# Patient Record
Sex: Female | Born: 1980 | Race: White | Hispanic: No | Smoking: Never smoker
Health system: Southern US, Community
[De-identification: ages and names within clinical notes are randomized; demographics above are authoritative.]

---

## 2002-02-23 ENCOUNTER — Other Ambulatory Visit: Admission: RE | Admit: 2002-02-23 | Discharge: 2002-02-23 | Payer: Self-pay | Admitting: Obstetrics and Gynecology

## 2003-03-07 ENCOUNTER — Other Ambulatory Visit: Admission: RE | Admit: 2003-03-07 | Discharge: 2003-03-07 | Payer: Self-pay

## 2007-03-09 ENCOUNTER — Ambulatory Visit: Payer: Self-pay | Admitting: Family Medicine

## 2007-04-06 ENCOUNTER — Ambulatory Visit: Payer: Self-pay | Admitting: Family Medicine

## 2007-05-18 ENCOUNTER — Ambulatory Visit: Payer: Self-pay | Admitting: Family Medicine

## 2007-07-04 ENCOUNTER — Ambulatory Visit: Payer: Self-pay | Admitting: Family Medicine

## 2007-07-18 ENCOUNTER — Ambulatory Visit: Payer: Self-pay | Admitting: Family Medicine

## 2007-10-24 ENCOUNTER — Ambulatory Visit: Payer: Self-pay | Admitting: Family Medicine

## 2007-11-28 ENCOUNTER — Other Ambulatory Visit: Admission: RE | Admit: 2007-11-28 | Discharge: 2007-11-28 | Payer: Self-pay | Admitting: Family Medicine

## 2007-11-28 ENCOUNTER — Ambulatory Visit: Payer: Self-pay | Admitting: Family Medicine

## 2007-12-22 ENCOUNTER — Encounter: Admission: RE | Admit: 2007-12-22 | Discharge: 2007-12-22 | Payer: Self-pay | Admitting: Cardiovascular Disease

## 2007-12-23 ENCOUNTER — Ambulatory Visit (HOSPITAL_COMMUNITY): Admission: RE | Admit: 2007-12-23 | Discharge: 2007-12-23 | Payer: Self-pay | Admitting: Cardiology

## 2008-02-22 ENCOUNTER — Ambulatory Visit: Payer: Self-pay | Admitting: Family Medicine

## 2008-07-24 ENCOUNTER — Ambulatory Visit (HOSPITAL_COMMUNITY): Admission: RE | Admit: 2008-07-24 | Discharge: 2008-07-24 | Payer: Self-pay | Admitting: Oral Surgery

## 2008-11-27 ENCOUNTER — Other Ambulatory Visit: Admission: RE | Admit: 2008-11-27 | Discharge: 2008-11-27 | Payer: Self-pay | Admitting: Family Medicine

## 2008-11-27 ENCOUNTER — Ambulatory Visit: Payer: Self-pay | Admitting: Family Medicine

## 2009-01-16 ENCOUNTER — Ambulatory Visit: Payer: Self-pay | Admitting: Family Medicine

## 2009-01-25 ENCOUNTER — Ambulatory Visit: Payer: Self-pay | Admitting: Family Medicine

## 2009-02-07 ENCOUNTER — Encounter: Admission: RE | Admit: 2009-02-07 | Discharge: 2009-02-07 | Payer: Self-pay | Admitting: Cardiology

## 2009-04-10 ENCOUNTER — Ambulatory Visit: Payer: Self-pay | Admitting: Family Medicine

## 2009-05-02 ENCOUNTER — Ambulatory Visit: Payer: Self-pay | Admitting: Family Medicine

## 2009-05-13 ENCOUNTER — Ambulatory Visit: Payer: Self-pay | Admitting: Family Medicine

## 2009-08-22 ENCOUNTER — Ambulatory Visit: Payer: Self-pay | Admitting: Family Medicine

## 2009-09-30 ENCOUNTER — Ambulatory Visit: Payer: Self-pay | Admitting: Family Medicine

## 2009-10-17 ENCOUNTER — Ambulatory Visit: Payer: Self-pay | Admitting: Physician Assistant

## 2009-11-29 ENCOUNTER — Encounter: Admission: RE | Admit: 2009-11-29 | Discharge: 2010-01-27 | Payer: Self-pay | Admitting: Physician Assistant

## 2009-12-09 ENCOUNTER — Ambulatory Visit: Payer: Self-pay | Admitting: Physician Assistant

## 2009-12-26 ENCOUNTER — Encounter: Admission: RE | Admit: 2009-12-26 | Discharge: 2009-12-26 | Payer: Self-pay | Admitting: Family Medicine

## 2010-01-13 ENCOUNTER — Other Ambulatory Visit: Admission: RE | Admit: 2010-01-13 | Discharge: 2010-01-13 | Payer: Self-pay | Admitting: Family Medicine

## 2010-01-13 ENCOUNTER — Ambulatory Visit: Payer: Self-pay | Admitting: Family Medicine

## 2010-01-27 ENCOUNTER — Ambulatory Visit: Payer: Self-pay | Admitting: Physician Assistant

## 2010-02-18 ENCOUNTER — Ambulatory Visit: Payer: Self-pay | Admitting: Physician Assistant

## 2010-04-21 ENCOUNTER — Ambulatory Visit: Payer: Self-pay | Admitting: Family Medicine

## 2010-04-24 ENCOUNTER — Ambulatory Visit: Payer: Self-pay | Admitting: Family Medicine

## 2010-05-08 ENCOUNTER — Ambulatory Visit: Payer: Self-pay | Admitting: Family Medicine

## 2010-06-23 ENCOUNTER — Ambulatory Visit: Payer: Self-pay | Admitting: Family Medicine

## 2010-07-31 IMAGING — US US PELVIS COMPLETE
1 series · 14 of 25 positions shown · non-contrast
Comparison: None

CLINICAL DATA: Pelvic pain

TRANSABDOMINAL AND TRANSVAGINAL ULTRASOUND OF PELVIS
TECHNIQUE: Both transabdominal and transvaginal ultrasound
examinations of the pelvis were performed including evaluation of
the uterus, ovaries, adnexal regions, and pelvic cul-de-sac.

[Series 1: us pelvis complete · 0.19mm/px · 14 of 66 slices shown]
[im 1/66]
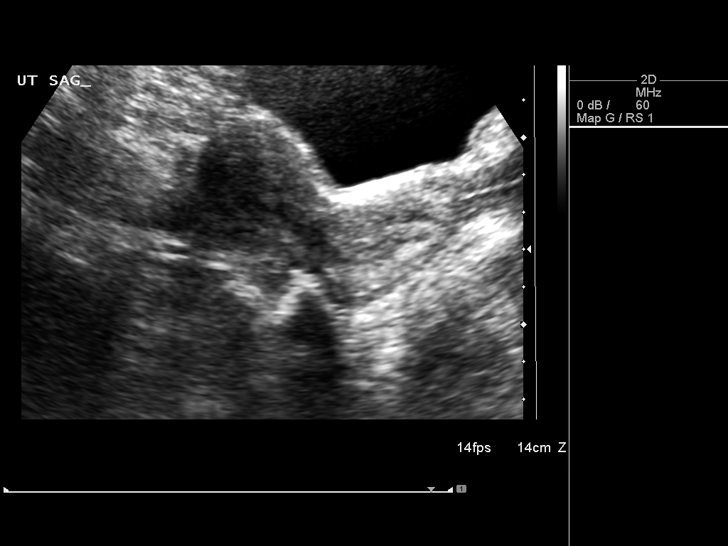
[im 6/66]
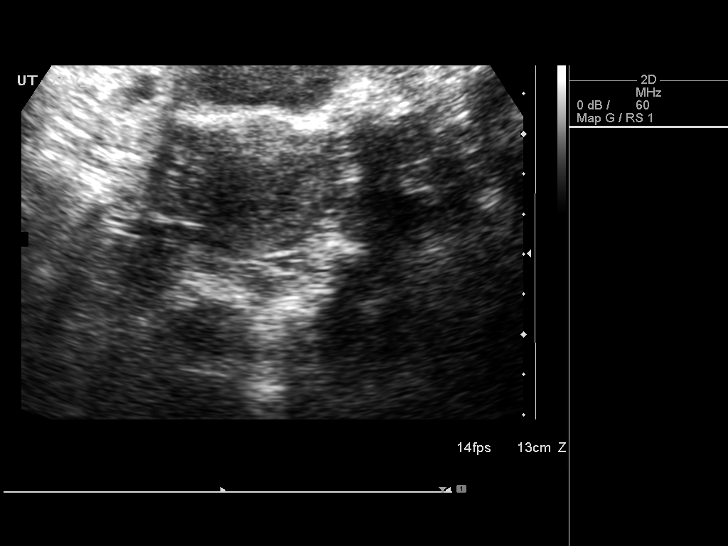
[im 11/66]
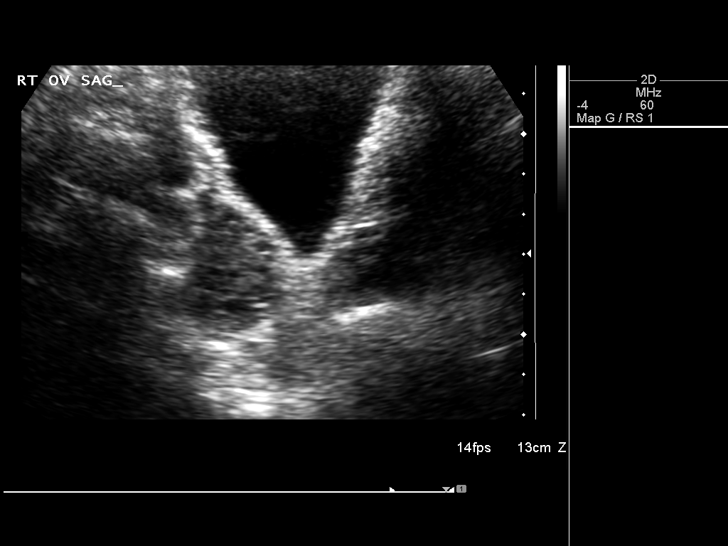
[im 17/66]
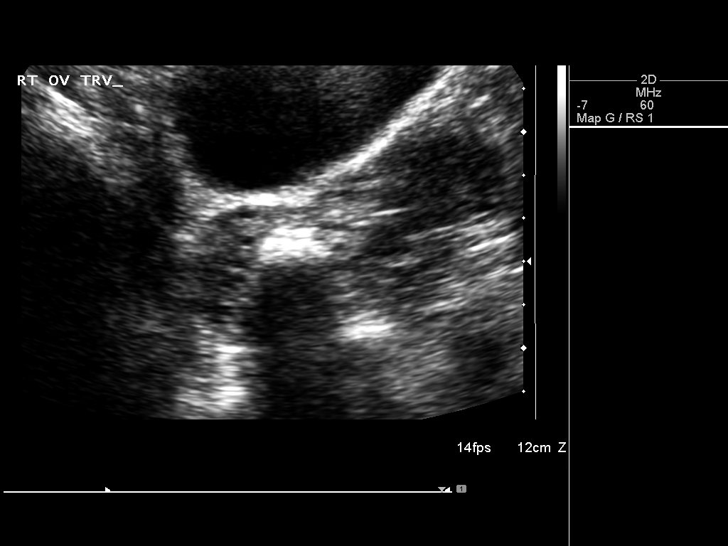
[im 22/66]
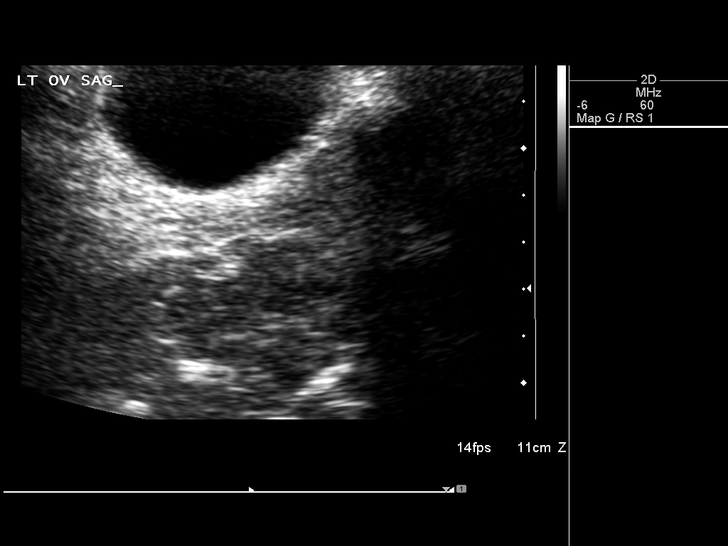
[im 25/66]
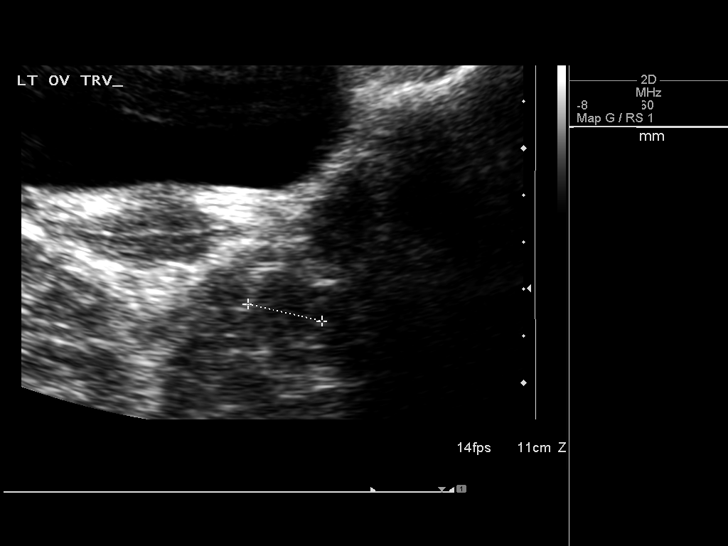
[im 30/66]
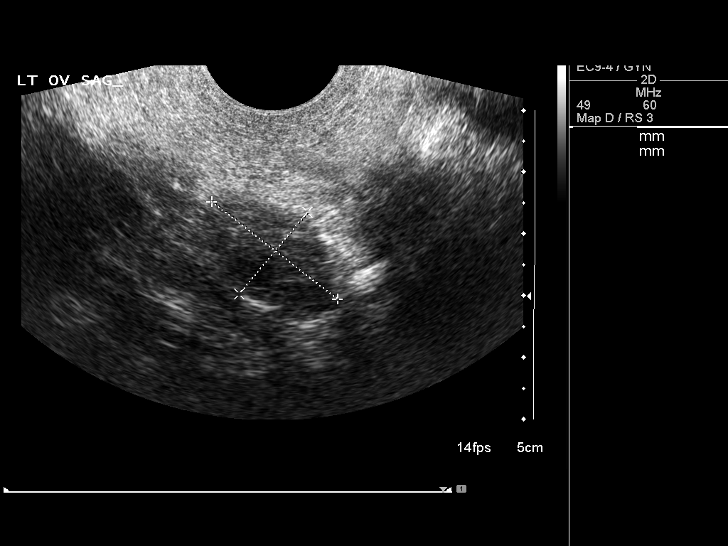
[im 36/66]
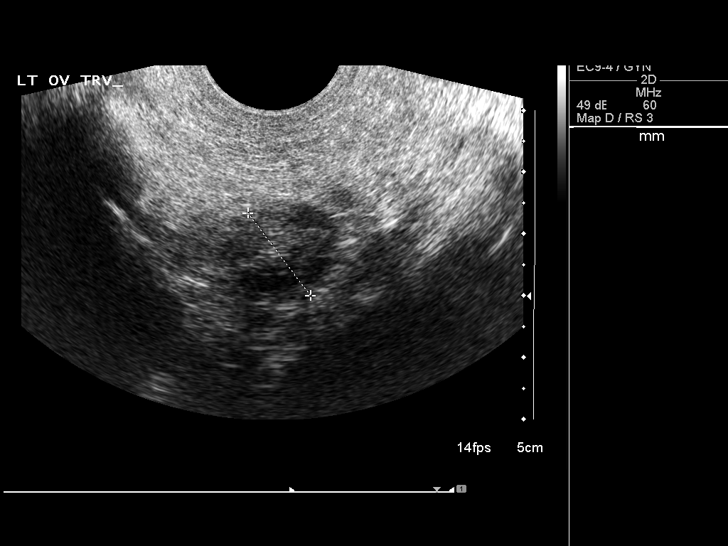
[im 41/66]
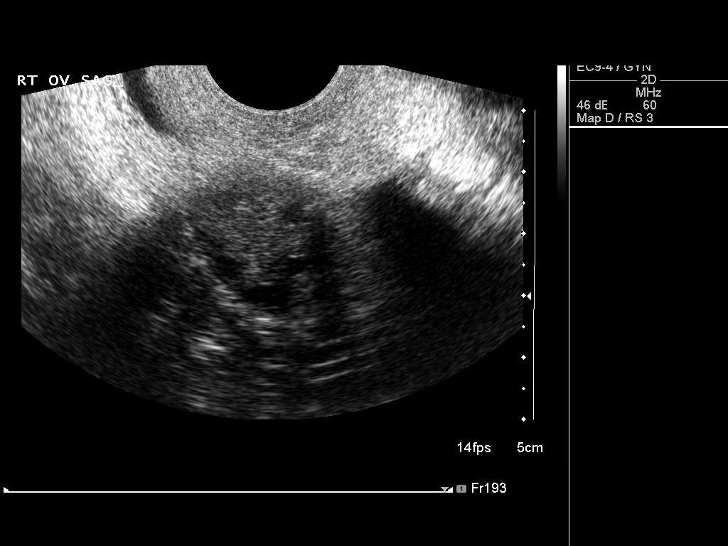
[im 44/66]
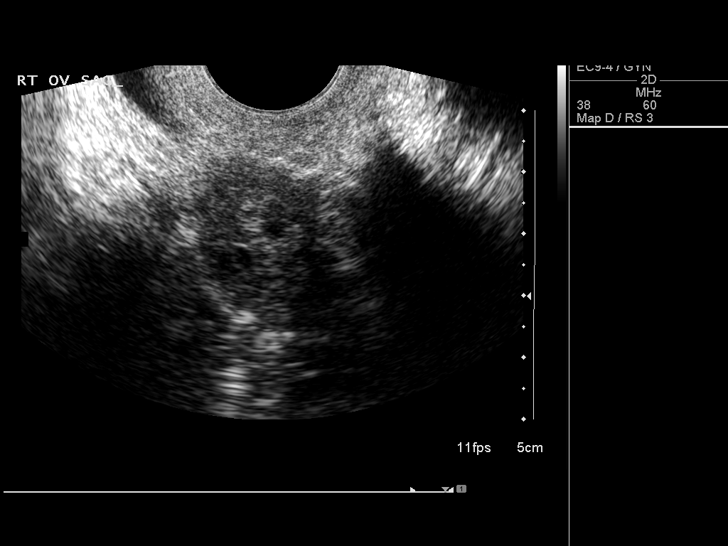
[im 49/66]
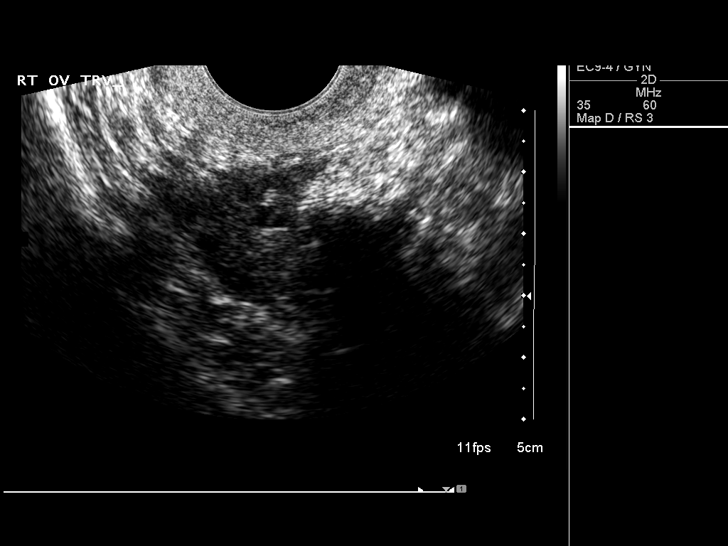
[im 55/66]
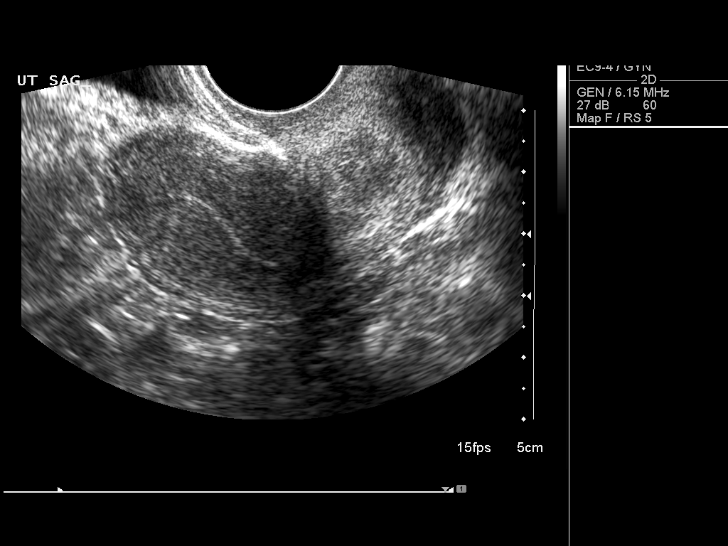
[im 60/66]
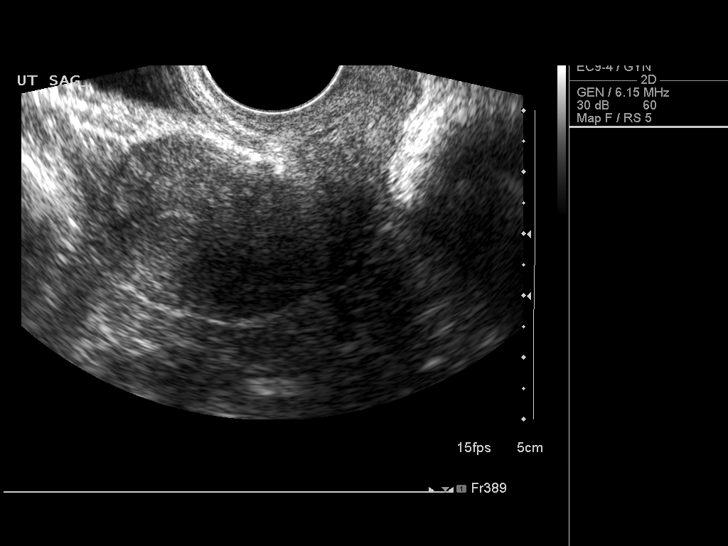
[im 66/66]
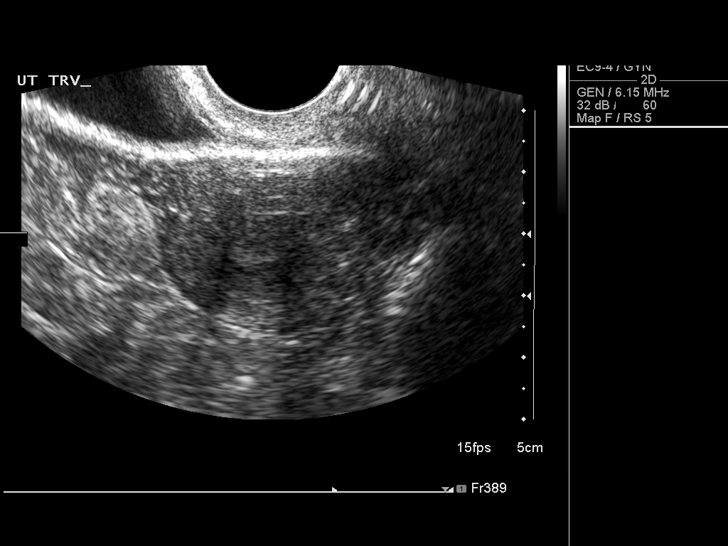

[14 of 25 positions shown; findings below may reference images not displayed]

FINDINGS: Uterus: The uterus is normal in size measuring 6.0 cm sagittally
with a depth of 2.7 cm and width of 3.9 cm.  No myometrial
abnormality is seen.

Endometrium:The endometrium is normal measuring 1.4 mm in
thickness.

Right Ovary :The right ovary is normal in size measuring 2.7 x
x 1.9 cm.

Left Ovary :The left ovary is normal measuring 2.6 x 1.7 x 1.7 cm.

Other Findings:  No free fluid is seen.
IMPRESSION: Negative ultrasound of the pelvis.

## 2010-08-14 ENCOUNTER — Ambulatory Visit
Admission: RE | Admit: 2010-08-14 | Discharge: 2010-08-14 | Payer: Self-pay | Source: Home / Self Care | Attending: Family Medicine | Admitting: Family Medicine

## 2010-10-08 ENCOUNTER — Ambulatory Visit (INDEPENDENT_AMBULATORY_CARE_PROVIDER_SITE_OTHER): Payer: BC Managed Care – PPO | Admitting: Family Medicine

## 2010-10-08 DIAGNOSIS — F319 Bipolar disorder, unspecified: Secondary | ICD-10-CM

## 2010-10-09 ENCOUNTER — Ambulatory Visit: Payer: Self-pay | Admitting: Family Medicine

## 2010-10-10 ENCOUNTER — Emergency Department (HOSPITAL_COMMUNITY): Payer: BC Managed Care – PPO

## 2010-10-10 ENCOUNTER — Emergency Department (HOSPITAL_COMMUNITY)
Admission: EM | Admit: 2010-10-10 | Discharge: 2010-10-10 | Disposition: A | Discharge status: Home / Self Care | Payer: BC Managed Care – PPO | Attending: Emergency Medicine | Admitting: Emergency Medicine

## 2010-10-10 DIAGNOSIS — R259 Unspecified abnormal involuntary movements: Secondary | ICD-10-CM | POA: Insufficient documentation

## 2010-10-10 DIAGNOSIS — F410 Panic disorder [episodic paroxysmal anxiety] without agoraphobia: Secondary | ICD-10-CM | POA: Insufficient documentation

## 2010-10-10 DIAGNOSIS — T443X1A Poisoning by other parasympatholytics [anticholinergics and antimuscarinics] and spasmolytics, accidental (unintentional), initial encounter: Secondary | ICD-10-CM | POA: Insufficient documentation

## 2010-10-10 DIAGNOSIS — R112 Nausea with vomiting, unspecified: Secondary | ICD-10-CM | POA: Insufficient documentation

## 2010-10-10 DIAGNOSIS — G40909 Epilepsy, unspecified, not intractable, without status epilepticus: Secondary | ICD-10-CM | POA: Insufficient documentation

## 2010-10-10 DIAGNOSIS — I428 Other cardiomyopathies: Secondary | ICD-10-CM | POA: Insufficient documentation

## 2010-10-10 DIAGNOSIS — F259 Schizoaffective disorder, unspecified: Secondary | ICD-10-CM | POA: Insufficient documentation

## 2010-10-10 DIAGNOSIS — F319 Bipolar disorder, unspecified: Secondary | ICD-10-CM | POA: Insufficient documentation

## 2010-10-10 DIAGNOSIS — F411 Generalized anxiety disorder: Secondary | ICD-10-CM | POA: Insufficient documentation

## 2010-10-10 DIAGNOSIS — F209 Schizophrenia, unspecified: Secondary | ICD-10-CM | POA: Insufficient documentation

## 2010-10-10 DIAGNOSIS — E876 Hypokalemia: Secondary | ICD-10-CM | POA: Insufficient documentation

## 2010-10-10 DIAGNOSIS — R079 Chest pain, unspecified: Secondary | ICD-10-CM | POA: Insufficient documentation

## 2010-10-10 LAB — BASIC METABOLIC PANEL
BUN: 6 mg/dL (ref 6–23)
CO2: 25 mEq/L (ref 19–32)
Calcium: 9.8 mg/dL (ref 8.4–10.5)
Chloride: 108 mEq/L (ref 96–112)
Creatinine, Ser: 0.72 mg/dL (ref 0.4–1.2)
GFR calc Af Amer: >60 mL/min (ref >60)
GFR calc non Af Amer: >60 mL/min (ref >60)
Glucose, Bld: 99 mg/dL (ref 70–99)
Potassium: 3.8 mEq/L (ref 3.5–5.1)
Sodium: 140 mEq/L (ref 135–145)

## 2010-10-10 LAB — POCT I-STAT, CHEM 8
BUN: 8 mg/dL (ref 6–23)
Calcium, Ion: 1.2 mmol/L (ref 1.12–1.32)
Chloride: 106 mEq/L (ref 96–112)
Creatinine, Ser: 0.8 mg/dL (ref 0.4–1.2)
Glucose, Bld: 95 mg/dL (ref 70–99)
HCT: 40 % (ref 36.0–46.0)
Hemoglobin: 13.6 g/dL (ref 12.0–15.0)
Potassium: 3.2 mEq/L — ABNORMAL LOW (ref 3.5–5.1)
Sodium: 138 mEq/L (ref 135–145)
TCO2: 20 mmol/L (ref 0–100)

## 2010-10-10 LAB — COMPREHENSIVE METABOLIC PANEL
ALT: 19 U/L (ref 0–35)
AST: 23 U/L (ref 0–37)
Albumin: 4.1 g/dL (ref 3.5–5.2)
Alkaline Phosphatase: 40 U/L (ref 39–117)
BUN: 8 mg/dL (ref 6–23)
CO2: 24 mEq/L (ref 19–32)
Calcium: 10 mg/dL (ref 8.4–10.5)
Chloride: 105 mEq/L (ref 96–112)
Creatinine, Ser: 0.78 mg/dL (ref 0.4–1.2)
GFR calc Af Amer: >60 mL/min (ref >60)
GFR calc non Af Amer: >60 mL/min (ref >60)
Glucose, Bld: 97 mg/dL (ref 70–99)
Potassium: 3.1 mEq/L — ABNORMAL LOW (ref 3.5–5.1)
Sodium: 139 mEq/L (ref 135–145)
Total Bilirubin: 0.4 mg/dL (ref 0.3–1.2)
Total Protein: 7.5 g/dL (ref 6.0–8.3)

## 2010-10-10 LAB — URINALYSIS, ROUTINE W REFLEX MICROSCOPIC
Bilirubin Urine: NEGATIVE
Glucose, UA: NEGATIVE mg/dL
Hgb urine dipstick: NEGATIVE
Ketones, ur: NEGATIVE mg/dL
Nitrite: NEGATIVE
Protein, ur: NEGATIVE mg/dL
Specific Gravity, Urine: 1.006 (ref 1.005–1.030)
Urobilinogen, UA: 0.2 mg/dL (ref 0.0–1.0)
pH: 7 (ref 5.0–8.0)

## 2010-10-10 LAB — DIFFERENTIAL
Basophils Absolute: 0.1 10*3/uL (ref 0.0–0.1)
Basophils Relative: 1 % (ref 0–1)
Eosinophils Absolute: 0.4 10*3/uL (ref 0.0–0.7)
Eosinophils Relative: 2 % (ref 0–5)
Lymphocytes Relative: 20 % (ref 12–46)
Lymphs Abs: 3.3 10*3/uL (ref 0.7–4.0)
Monocytes Absolute: 1.7 10*3/uL — ABNORMAL HIGH (ref 0.1–1.0)
Monocytes Relative: 10 % (ref 3–12)
Neutro Abs: 11.2 10*3/uL — ABNORMAL HIGH (ref 1.7–7.7)
Neutrophils Relative %: 67 % (ref 43–77)

## 2010-10-10 LAB — CBC
HCT: 39 % (ref 36.0–46.0)
Hemoglobin: 12.9 g/dL (ref 12.0–15.0)
MCH: 28.7 pg (ref 26.0–34.0)
MCHC: 33.1 g/dL (ref 30.0–36.0)
MCV: 86.7 fL (ref 78.0–100.0)
Platelets: 225 10*3/uL (ref 150–400)
RBC: 4.5 MIL/uL (ref 3.87–5.11)
RDW: 12.9 % (ref 11.5–15.5)
WBC: 16.7 10*3/uL — ABNORMAL HIGH (ref 4.0–10.5)

## 2010-10-10 LAB — ACETAMINOPHEN LEVEL: Acetaminophen (Tylenol), Serum: <10 ug/mL — ABNORMAL LOW (ref 10–30)

## 2010-10-10 LAB — SALICYLATE LEVEL: Salicylate Lvl: <4 mg/dL (ref 2.8–20.0)

## 2010-10-10 LAB — ETHANOL: Alcohol, Ethyl (B): <5 mg/dL (ref 0–10)

## 2010-11-07 ENCOUNTER — Ambulatory Visit (HOSPITAL_COMMUNITY)
Admission: RE | Admit: 2010-11-07 | Discharge: 2010-11-07 | Disposition: A | Discharge status: Home / Self Care | Payer: BC Managed Care – PPO | Source: Ambulatory Visit | Attending: Psychiatry | Admitting: Psychiatry

## 2010-11-07 DIAGNOSIS — F259 Schizoaffective disorder, unspecified: Secondary | ICD-10-CM | POA: Insufficient documentation

## 2010-12-02 NOTE — Op Note (Signed)
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   PROCEDURE:  Surgical removal of 4 impacted third molars in the main OR  secondary to bipolar disease with pharmacological complications.   ANESTHESIA:  General.   SURGEON:  Hinton Dyer, D.D.S.   ASSISTANTS:  1. Rie Montez Morita.  2. Windy Kalata, DOMA   ESTIMATED BLOOD LOSS:  10 mL.   CONDITION AND SURGERY:  Good.   Following preoperative medications, she was brought to the operating  room in a supine position in which she remained throughout the whole  procedure.  She was intubated by a right nasal endotracheal tube and  prepped and draped in the usual fashion for an intraoral procedure.  Four carpules of 2% Xylocaine with 1:100,000 epinephrine was given as  bilateral blocks and bilateral infiltrations of the maxilla and palate.  The throat was suctioned out and a moist open 4 x 4 gauze was placed  around the endotracheal tube.  The throat was suctioned out prior to  this.  A 15 blade was then used to make an incision over the left  tuberosity extending around tooth pad side 15.  A full-thickness  mucoperiosteal flap was elevated with a periosteal elevator.  Occlusobuccal and distal bone was removed with a rongeur.  The tooth was  mobilized and elevated out of the socket with an 11-A elevator.  The  socket was curetted and then closed with a 3-0 chromic suture.  A 15  blade was then used to make an incision over the left retromolar pad  with a releasing incision at the distal buccal aspect of tooth #18.  A  full-thickness mucoperiosteal flap was elevated with a periosteal  elevator.  Occlusobuccal and distal bone was removed with a round bur in  copious irrigation.  The tooth was sectioned in multiple pieces and  removed with an 11-A elevator and a root pick.  The socket was curetted,  irrigated and because of the length of the root Gelfoam soaked in some  Decadron was placed into the socket.  The socket was irrigated prior to  doing this and curetted.  The soft tissue was then closed with a 3-0  chromic suture.  A 15 blade made an incision over the right retromolar  pad with a releasing incision at the distal buccal aspect of tooth #31.  A full-thickness mucoperiosteal flap was elevated with a periosteal  elevator.  Occlusobuccal and distal bone was removed with a round bur in  copious irrigation.  The tooth was sectioned in multiple pieces and  removed with an 11-A elevator.  The socket was curetted, irrigated and a  small piece  of Gelfoam soaked in Decadron was placed into the socket.  The soft tissue was then closed with a 3-0 chromic suture.  A 15 blade  made an incision over the right tuberosity.  A periosteal elevator  reflected a full thickness mucoperiosteal flap.  Occlusobuccal and  distal bone was removed with a rongeur.  The tooth was elevated out of  the socket with an 11-A elevator.  The socket was curetted, irrigated,  and closed with a 3-0 chromic suture.  The patient tolerated the  procedure well and the throat pack was removed.  Gauze packs were  placed.  She was then extubated on the table and returned to recovery  room in good condition.  She was given written home care and diet  instructions.  She will be sent home postoperatively and was given  written home care and diet instructions.  Rx Vicodin x20, 1-2 q.4 h.  p.r.n. pain.  Should be followed by me in my private office.            ______________________________  Hinton Dyer, D.D.S.     JLM/MEDQ  D:  07/24/2008  T:  07/24/2008  Job:  [id number]   cc:   Ritta Slot, MD

## 2010-12-02 NOTE — H&P (Signed)
NAME:  SAHIAN, KERNEY NO.:  [id number]   MEDICAL RECORD NO.:  [id number]          PATIENT TYPE:  OIB   LOCATION:  2899                         FACILITY:  [facility]   PHYSICIAN:  Ritta Slot, MD     DATE OF BIRTH:  October 22, 1980   DATE OF ADMISSION:  12/23/2007  DATE OF DISCHARGE:                              HISTORY & PHYSICAL   INDICATIONS:  Ms. Fleece is a 30 year old, single, white female who  is the patient of Dr. Nanetta Batty.  She is a Buyer, retail at [employer], with  the history of bipolar disorder as well as schizoaffective disorder with  hallucinations and hearing voices.  She takes the following medications  Lamictal, Klonopin, Abilify, lithium, and benztropine.  She has been  following Dr. Allyson Sabal for multiple episodes of witnessed syncope of 2  different types with vomiting and loss of bowel and bladder functions.  Her workup for syncope has so far been revealing including a 2D echo  that showed a normal LV function with a small PFO and normal  laboratories.  She was brought to [facility] for catheterization and  tilt-table study for further evaluation of syncope.  After informed  consent, the patient underwent a tilt-table test protocol.  Her baseline  heart rate was 75 beats per minute with a non-invasive blood pressure of  119/84.  After 10 minutes of tilt, she was noted to have a systolic  blood pressure of 94/53 with episodes of significant bradycardia with at  least 4-5 second pauses on 2 separate occasions, ultimately requiring  temporary transcutaneous pacing during which time she was brought back  down to the supine position and she recovered her own rhythm.  She did  not passed out.  She was feeling lightheaded and dizzy and presyncope,  similar symptoms to when she passed out.  After being placed back in the  supine position for 3 minutes, her blood pressure came back to 110/81  with a normal heart rate of 77 sinus rhythm.  She returned back to the  holding area without any complications and feeling well.   I discussed the case with Dr. Allyson Sabal regarding her further management.  Clearly pacemaker insertion in this young individual with a history of  psychiatric illness would need to be considered, although not ideal  considering her young age and prior cardiac history.  I think a trial of  midodrine or Florinef to begin with or in combination would be more  suitable.   IMPRESSION:  A 30 year old single, white female with a positive tilt-  table test for cardioinhibitory syncope, but negative vasodepressor  syncope.   PLAN:  I think we should try her nonetheless on Florinef 0.1 mg b.i.d.  and up titrate that with the addition later on of midodrine 5-10 mg  t.i.d. per her symptoms.  Should she continue to get symptoms on these  medications, we can rechallenge her on with a tilt-table and if a tilt-  table test were to become positive, then we can consider pacemaker as an  option.  She will follow up with Dr. Allyson Sabal in next week for further  evaluation.  Ritta Slot, MD  Electronically Signed     HS/MEDQ  D:  12/23/2007  T:  12/23/2007  Job:  [id number]

## 2011-04-14 ENCOUNTER — Ambulatory Visit (HOSPITAL_BASED_OUTPATIENT_CLINIC_OR_DEPARTMENT_OTHER)
Admission: RE | Admit: 2011-04-14 | Discharge: 2011-04-14 | Disposition: A | Discharge status: Home / Self Care | Payer: BC Managed Care – PPO | Source: Ambulatory Visit | Attending: Urology | Admitting: Urology

## 2011-04-14 DIAGNOSIS — R350 Frequency of micturition: Secondary | ICD-10-CM | POA: Insufficient documentation

## 2011-04-14 DIAGNOSIS — N949 Unspecified condition associated with female genital organs and menstrual cycle: Secondary | ICD-10-CM | POA: Insufficient documentation

## 2011-04-14 DIAGNOSIS — Z01812 Encounter for preprocedural laboratory examination: Secondary | ICD-10-CM | POA: Insufficient documentation

## 2011-04-14 DIAGNOSIS — Z79899 Other long term (current) drug therapy: Secondary | ICD-10-CM | POA: Insufficient documentation

## 2011-04-14 DIAGNOSIS — I10 Essential (primary) hypertension: Secondary | ICD-10-CM | POA: Insufficient documentation

## 2011-04-14 DIAGNOSIS — K219 Gastro-esophageal reflux disease without esophagitis: Secondary | ICD-10-CM | POA: Insufficient documentation

## 2011-04-14 DIAGNOSIS — F209 Schizophrenia, unspecified: Secondary | ICD-10-CM | POA: Insufficient documentation

## 2011-04-14 DIAGNOSIS — R55 Syncope and collapse: Secondary | ICD-10-CM | POA: Insufficient documentation

## 2011-04-14 LAB — POCT PREGNANCY, URINE: Preg Test, Ur: NEGATIVE

## 2011-04-14 LAB — POCT HEMOGLOBIN-HEMACUE: Hemoglobin: 12.7 g/dL (ref 12.0–15.0)

## 2011-04-20 NOTE — Op Note (Signed)
  NAME:  Laura Mathews, Laura Mathews       ACCOUNT NO.:  [id number]  MEDICAL RECORD NO.:  [id number]  LOCATION:                               FACILITY:  [facility]  PHYSICIAN:  Martina Sinner, MD DATE OF BIRTH:  1981/03/01  DATE OF PROCEDURE:  04/14/2011 DATE OF DISCHARGE:                              OPERATIVE REPORT   PREOPERATIVE DIAGNOSIS:  Pelvic pain.  POSTOPERATIVE DIAGNOSIS:  Pelvic pain.  SURGERY:  Cystoscopy, bladder hydrodistention, and bladder instillation therapy.  Ms. Barna has pelvic pain and urinary frequency.  She consented to the above procedure.  She was prepped and draped in the usual fashion. 22-French scope was utilized.  Bladder mucosa and trigone were normal. There was no stitch, foreign body, or carcinoma.  She was hydrodistended at 800 cc.  I emptied the bladder.  I recystoscoped her, and again, the examination was completely normal, with no glomerulations or findings in keeping with interstitial cystitis.  I repeated the procedure and held it for approximately 4-5 minutes and emptied and reexamined with the same findings.  Ms. Majkowski has ongoing pelvic pain and frequency and we will proceed accordingly.          ______________________________ Martina Sinner, MD     SAM/MEDQ  D:  04/14/2011  T:  04/14/2011  Job:  [id number]  Electronically Signed by Alfredo Martinez MD on 04/20/2011 06:19:54 PM

## 2011-04-24 LAB — CBC
HCT: 38.7 % (ref 36.0–46.0)
Hemoglobin: 13.1 g/dL (ref 12.0–15.0)
MCHC: 33.7 g/dL (ref 30.0–36.0)
MCV: 87.3 fL (ref 78.0–100.0)
Platelets: 232 10*3/uL (ref 150–400)
RBC: 4.43 MIL/uL (ref 3.87–5.11)
RDW: 12.5 % (ref 11.5–15.5)
WBC: 9.3 10*3/uL (ref 4.0–10.5)

## 2011-05-15 IMAGING — CR DG CHEST 1V PORT
1 series · 1 of 1 positions shown · non-contrast
Comparison: Chest 02/18/2004.

CLINICAL DATA: Possible seizure.

PORTABLE CHEST - 1 VIEW

[AP]
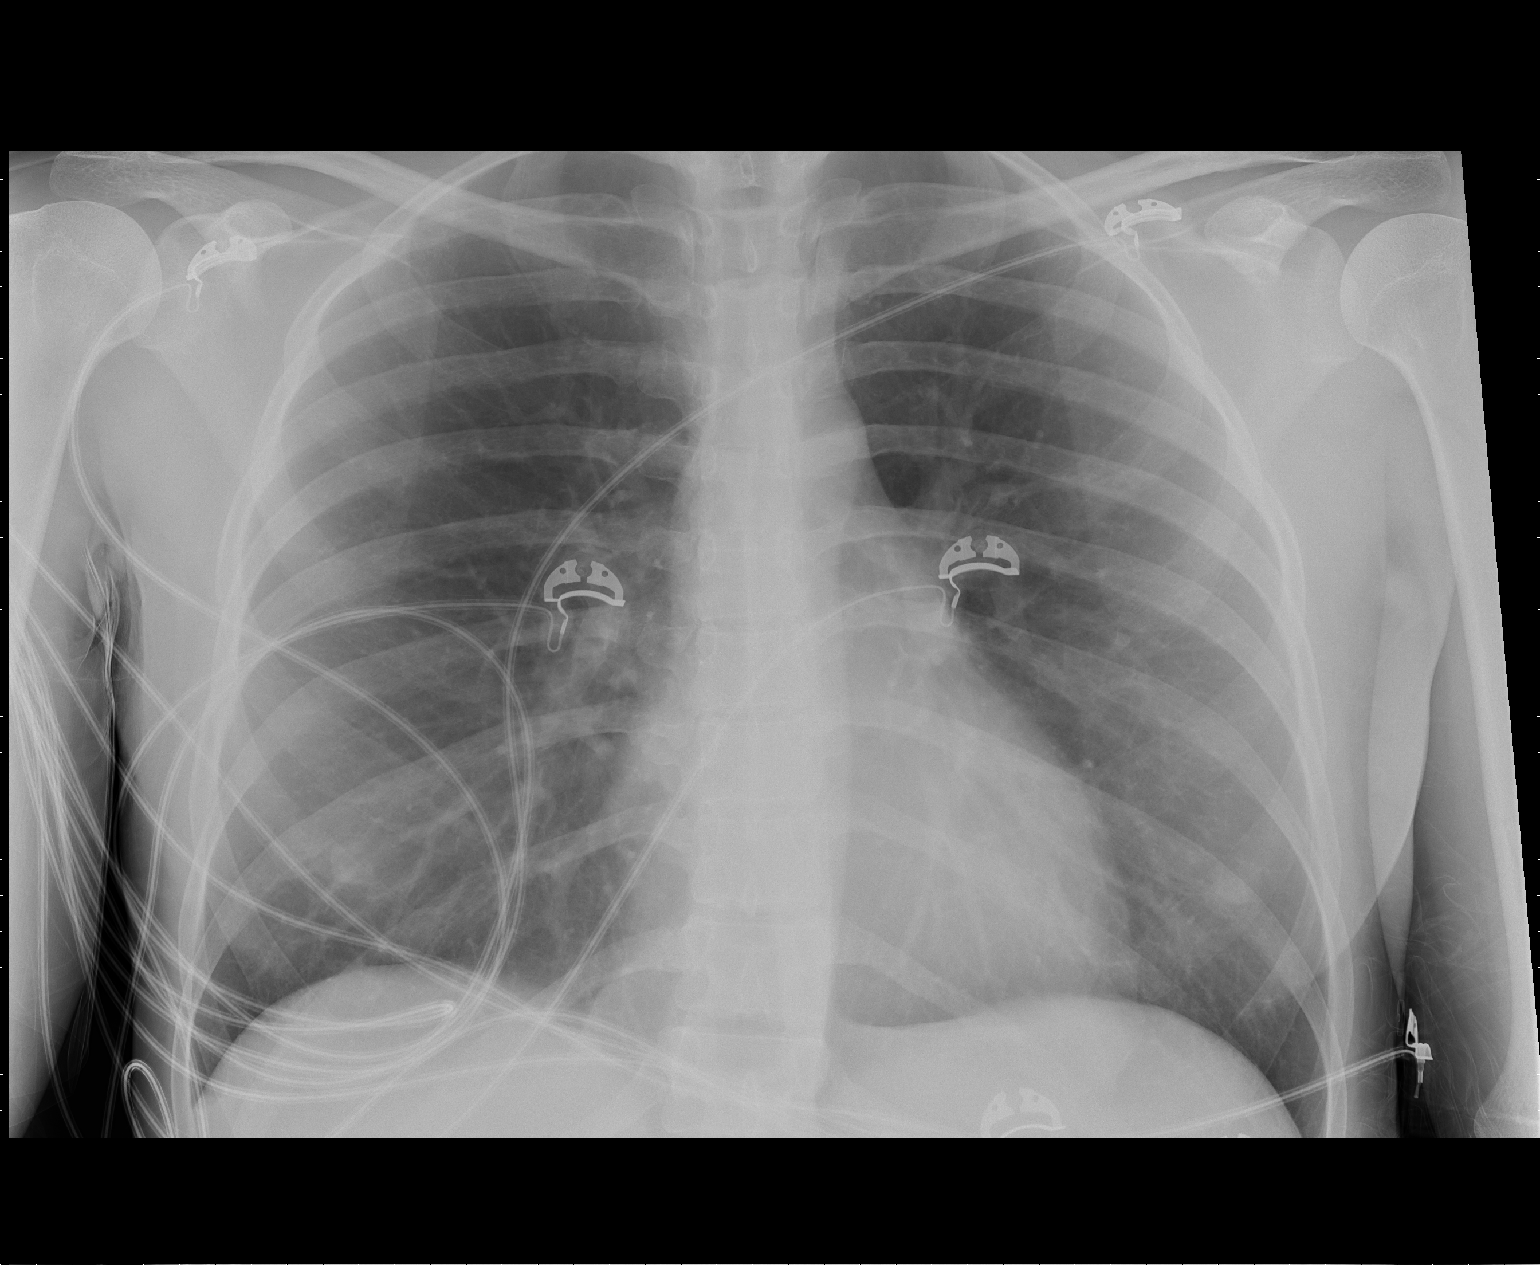

[1 of 1 positions shown; findings below may reference images not displayed]

FINDINGS: Lungs are clear.  No pneumothorax or effusion.  Heart
size normal.
IMPRESSION: No acute disease.

## 2013-01-31 ENCOUNTER — Other Ambulatory Visit (HOSPITAL_COMMUNITY)
Admission: RE | Admit: 2013-01-31 | Discharge: 2013-01-31 | Disposition: A | Discharge status: Home / Self Care | Payer: Medicare Other | Source: Ambulatory Visit | Attending: Family Medicine | Admitting: Family Medicine

## 2013-01-31 ENCOUNTER — Other Ambulatory Visit: Payer: Self-pay | Admitting: Family Medicine

## 2013-01-31 DIAGNOSIS — Z124 Encounter for screening for malignant neoplasm of cervix: Secondary | ICD-10-CM | POA: Insufficient documentation

## 2018-05-02 DIAGNOSIS — F411 Generalized anxiety disorder: Secondary | ICD-10-CM | POA: Insufficient documentation

## 2018-05-11 ENCOUNTER — Ambulatory Visit (INDEPENDENT_AMBULATORY_CARE_PROVIDER_SITE_OTHER): Payer: Medicare Other | Admitting: Psychiatry

## 2018-05-11 ENCOUNTER — Encounter: Payer: Self-pay | Admitting: Psychiatry

## 2018-05-11 DIAGNOSIS — F4001 Agoraphobia with panic disorder: Secondary | ICD-10-CM

## 2018-05-11 DIAGNOSIS — F250 Schizoaffective disorder, bipolar type: Secondary | ICD-10-CM | POA: Diagnosis not present

## 2018-05-11 DIAGNOSIS — F411 Generalized anxiety disorder: Secondary | ICD-10-CM

## 2018-05-11 NOTE — Patient Instructions (Addendum)
Constipation management 1.  Loss of water 2.  Powdered fiber supplement such as MiraLAX, Citrucel, etc. preferably with a meal 3.  2 stool softeners a day 4.  Milk of magnesia or magnesium tablets if needed  Norval Morton, physical therapist, neck exercises, Book is 7 Steps to a Pain Free Life will help posture.

## 2018-05-11 NOTE — Progress Notes (Signed)
[facility]  Patient ID: Laura Mathews,  MRN: [id number]  PCP: Patient, No Pcp Per  Date of Evaluation: 05/11/2018 Time spent:30 minutes   HISTORY/CURRENT STATUS: HPI  CC FU anxiety and bipolar disorder Had panic a few weeks ago around people but not sur e of the trigger.  Happened in restaurants and movie theaters.  Not sure why it happens that way.  Does get afraid of passing out from panic and embarrassing herself.  Laura Mathews hates it when she passes out.  He hesitates to take her places. Not ussually dizzy now.  Voices when anxious only. Reduces anxiety with rigid routine.  Fell apart at parents house bc really stressed with different routine.  Cut down caffeine and that has helped anxiety somewhat.  Verty little now.  New therapist helping. Seen her about 3 months.  Pt reports that mood is Anxious and describes anxiety as Panic Attacks. Anxiety symptoms include: Excessive Worry, Panic Symptoms, Social Anxiety,. Pt reports no sleep issues. Pt reports that appetite is good. Pt reports that energy is good and down slightly. Concentration is down slightly. Suicidal thoughts:  denied by patient.  Individual Medical History/ Review of Systems: Changes? :Yes tremor moderate, constipation  Allergies: Metformin and related and Naltrexone  Current Medications:  Current Outpatient Medications:  .  b complex vitamins tablet, Take 1 tablet by mouth daily., Disp: , Rfl:  .  Cholecalciferol (VITAMIN D3) 5000 units CAPS, Take 1 capsule by mouth daily., Disp: , Rfl:  .  clonazePAM (KLONOPIN) 0.5 MG tablet, Take 0.5 mg by mouth 2 (two) times daily. Take 1 tab two times a day & 2 tabs at bedtime as needed., Disp: , Rfl:  .  lamoTRIgine (LAMICTAL) 200 MG tablet, Take 200 mg by mouth daily., Disp: , Rfl:  .  liothyronine (CYTOMEL) 25 MCG tablet, Take 25 mcg by mouth every morning., Disp: , Rfl:  .  lithium carbonate 300 MG capsule, Take 300 mg by mouth 2 (two) times daily. Take 2  (600 mg) caps, Disp: , Rfl:  .  loxapine (LOXITANE) 25 MG capsule, Take 25 mg by mouth at bedtime. Take 1 (25 mg) at bedtime with 3 50mg , Disp: , Rfl:  .  loxapine (LOXITANE) 50 MG capsule, Take 50 mg by mouth at bedtime. Take 3 (150 mg) at bedtime, Disp: , Rfl:  .  QUEtiapine (SEROQUEL) 25 MG tablet, Take 25 mg by mouth 2 (two) times daily as needed (take 1-2 tabs as needed)., Disp: , Rfl:  .  sertraline (ZOLOFT) 100 MG tablet, Take 100 mg by mouth daily. Take 2 (200 mg) daily, Disp: , Rfl:  .  sertraline (ZOLOFT) 50 MG tablet, Take 50 mg by mouth daily. Take 1 tab (50 mg) daily with 2 100mg  tabs, Disp: , Rfl:  .  traZODone (DESYREL) 100 MG tablet, Take 100 mg by mouth at bedtime., Disp: , Rfl:  .  donepezil (ARICEPT) 10 MG tablet, Take 10 mg by mouth daily. Take 5 mg daily x 30 days, then 10 mg daily, Disp: , Rfl:  Medication Side Effects: Other: constipation, tremor, jerks.  Family Medical/ Social History: Changes? 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  MENTAL HEALTH EXAM:  There were no vitals taken for this visit.There is no height or weight on file to calculate BMI.  General Appearance: Casual and tremor moderate  Eye Contact:  Good  Speech:  Normal Rate  Volume:  Normal  Mood:  Anxious and Depressed  Affect:  Appropriate and Restricted  Thought Process:  Goal Directed  Orientation:  Full (Time, Place, and Person)  Thought Content: Logical and Hallucinations: Auditory under stress   Suicidal Thoughts:  No  Homicidal Thoughts:  No  Memory:  Recent  Judgement:  Fair  Insight:  Fair  Psychomotor Activity:  Normal  Concentration:  Concentration: Fair  Recall:  Good  Fund of Knowledge: Good  Language: Good  Akathisia:  No  AIMS (if indicated): not done  Assets:  Communication Skills Desire for Improvement Housing Intimacy Leisure Time Physical Health Social Support Transportation  ADL's:  Intact  Cognition: WNL  Prognosis:  Fair    DIAGNOSES:     ICD-10-CM   1. Schizoaffective disorder, bipolar type (HCC) F25.0   2. Generalized anxiety disorder F41.1   3. Panic disorder with agoraphobia F40.01     RECOMMENDATIONS:  Greater than 50% of face to face time with patient was spent on counseling and coordination of care. We discussed chronic anxiety and avoidance.  Overall she feels OK about the medication. Discussed questions about off-label Aricept for cognitive complaints and the fear of having pain in stomach like she did with naltrexone when she had to go to ER.  Discussed importance of maintaining hydration bc hx of passing out. Disc pros/cons of trying to better control of psychosis balanced against the risk of TD which she had before with withdrawal from Abilify.  Would prefer to keep lowest effective dose of this. Disc [place] DMV document that had to be filled out to clear her driving.  Disc care re driving if sleepy from any meds.  Constipation management 1.  Loss of water 2.  Powdered fiber supplement such as MiraLAX, Citrucel, etc. preferably with a meal 3.  2 stool softeners a day 4.  Milk of magnesia or magnesium tablets if needed  Norval Morton, physical therapist, neck exercises, Book is 7 Steps to a Pain Free Life will help posture.  No med changes otherwise today.  FU 8 weeks.  Needs a lot of support.   Lauraine Rinne, MD

## 2018-05-17 ENCOUNTER — Other Ambulatory Visit: Payer: Self-pay

## 2018-05-17 ENCOUNTER — Other Ambulatory Visit: Payer: Self-pay | Admitting: Psychiatry

## 2018-05-17 MED ORDER — SERTRALINE HCL 100 MG PO TABS: 200.0000 mg | ORAL_TABLET | Freq: Every day | ORAL | 0 refills | Status: DC

## 2018-05-17 MED ORDER — SERTRALINE HCL 50 MG PO TABS: 50.0000 mg | ORAL_TABLET | Freq: Every day | ORAL | 0 refills | Status: DC

## 2018-05-17 MED ORDER — SERTRALINE HCL 100 MG PO TABS: 200.0000 mg | ORAL_TABLET | Freq: Every day | ORAL | 1 refills | Status: DC

## 2018-05-17 NOTE — Progress Notes (Signed)
Refills sertraline

## 2018-05-18 ENCOUNTER — Other Ambulatory Visit: Payer: Self-pay

## 2018-05-18 MED ORDER — DONEPEZIL HCL 10 MG PO TABS: 10.0000 mg | ORAL_TABLET | Freq: Every day | ORAL | 3 refills | Status: DC

## 2018-05-23 ENCOUNTER — Other Ambulatory Visit: Payer: Self-pay

## 2018-05-23 MED ORDER — LITHIUM CARBONATE 300 MG PO CAPS: 600.0000 mg | ORAL_CAPSULE | Freq: Two times a day (BID) | ORAL | 0 refills | Status: DC

## 2018-05-23 MED ORDER — LITHIUM CARBONATE 300 MG PO CAPS: 600.0000 mg | ORAL_CAPSULE | Freq: Two times a day (BID) | ORAL | 1 refills | Status: DC

## 2018-05-23 NOTE — Progress Notes (Signed)
refills  

## 2018-05-26 ENCOUNTER — Other Ambulatory Visit: Payer: Self-pay

## 2018-05-26 MED ORDER — LITHIUM CARBONATE ER 300 MG PO TBCR: 600.0000 mg | EXTENDED_RELEASE_TABLET | Freq: Two times a day (BID) | ORAL | 0 refills | Status: DC

## 2018-05-26 MED ORDER — LITHIUM CARBONATE ER 300 MG PO TBCR: 600.0000 mg | EXTENDED_RELEASE_TABLET | Freq: Two times a day (BID) | ORAL | 1 refills | Status: DC

## 2018-06-09 ENCOUNTER — Other Ambulatory Visit: Payer: Self-pay | Admitting: Psychiatry

## 2018-06-10 ENCOUNTER — Other Ambulatory Visit: Payer: Self-pay | Admitting: Psychiatry

## 2018-06-13 ENCOUNTER — Other Ambulatory Visit: Payer: Self-pay | Admitting: Psychiatry

## 2018-06-22 ENCOUNTER — Other Ambulatory Visit: Payer: Self-pay | Admitting: Psychiatry

## 2018-06-24 ENCOUNTER — Telehealth: Payer: Self-pay | Admitting: Psychiatry

## 2018-06-24 DIAGNOSIS — R7989 Other specified abnormal findings of blood chemistry: Secondary | ICD-10-CM

## 2018-06-24 DIAGNOSIS — F250 Schizoaffective disorder, bipolar type: Secondary | ICD-10-CM

## 2018-06-24 DIAGNOSIS — E559 Vitamin D deficiency, unspecified: Secondary | ICD-10-CM

## 2018-06-24 NOTE — Telephone Encounter (Signed)
RTC to mother from 2 days ago.  She increased the ECT to 6 ECT in 4 weeks.  Got more confused and foggy and tearful.  Getting bilateral ECT.    Educ re ECT bilateral that will cause temporary memory problems and confusion.  It is controversial as to whether there is evidence for long-term memory impairment from maintenance ECT.  Also we discussed that whenever ECT treatments have been spread out she tends to have more depression.  Her mother is not convinced of the value of ECT and is worried about long-term memory problems.  We discussed this at great length.  Laura Mathews is extremely sensitive to antipsychotic medications and very prone to EPS and tardive dyskinesia so that leaves very little in the way of alternatives to treat her schizoaffective disorder with the depression.  I suggested [facility] about any ideas they have about alternatives to the loxapine.  If she is not taken Vraylar that might be one option however she did have withdrawal dyskinesia when she came off of Abilify and there is a similar social agonist effect with Vraylar to that of Abilify so this is a concern.  Her mother will come to her next psychiatric appointment which is soon.  Her mother notes a history of seasonal worsening of depression during this time of year so we will repeat her lithium level and check a vitamin D level as that has been associated with an increased risk of depression as well.  Meredith Staggersarey Cottle MD, DFAPA

## 2018-06-29 ENCOUNTER — Other Ambulatory Visit: Payer: Self-pay | Admitting: Psychiatry

## 2018-07-04 LAB — LITHIUM LEVEL: Lithium Lvl: 1.3 mmol/L — ABNORMAL HIGH (ref 0.6–1.2)

## 2018-07-04 LAB — VITAMIN D 1,25 DIHYDROXY
Vitamin D 1, 25 (OH)2 Total: 70 pg/mL — ABNORMAL HIGH
Vitamin D2 1, 25 (OH)2: <10 pg/mL
Vitamin D3 1, 25 (OH)2: 70 pg/mL

## 2018-07-06 ENCOUNTER — Encounter: Payer: Self-pay | Admitting: Psychiatry

## 2018-07-06 ENCOUNTER — Ambulatory Visit (INDEPENDENT_AMBULATORY_CARE_PROVIDER_SITE_OTHER): Payer: Medicare Other | Admitting: Psychiatry

## 2018-07-06 DIAGNOSIS — F411 Generalized anxiety disorder: Secondary | ICD-10-CM

## 2018-07-06 DIAGNOSIS — F250 Schizoaffective disorder, bipolar type: Secondary | ICD-10-CM

## 2018-07-06 DIAGNOSIS — G251 Drug-induced tremor: Secondary | ICD-10-CM

## 2018-07-06 DIAGNOSIS — R7989 Other specified abnormal findings of blood chemistry: Secondary | ICD-10-CM | POA: Diagnosis not present

## 2018-07-06 MED ORDER — LOXAPINE SUCCINATE 50 MG PO CAPS: 250.0000 mg | ORAL_CAPSULE | Freq: Every day | ORAL | 0 refills | Status: DC

## 2018-07-06 NOTE — Patient Instructions (Signed)
Loxapine increase to 200mg  daily for 1 week then Increase 225 mg daily for 1 week, then  Increase to 250 mg daily.

## 2018-07-06 NOTE — Progress Notes (Signed)
[facility] [id number] Mar 15, 1981 37 y.o.  Subjective:   Patient ID:  Laura Mathews is a 37 y.o. (DOB 10-29-1980) female.  Chief Complaint:  Chief Complaint  Patient presents with  . Follow-up    Medication Management  . Depression  . Anxiety    HPI Laura Mathews presents to the office today for follow-up of schizoaffective depression and anxiety.  Struggling with depression since here and started doing ECT weekly and that has helped the depression.  "Struggling with psychosis" and ECT docs recommended clozapine.  Clouds my brain and worse when she's under stress even normal things like cooking and trouble following a recipe.  Hearing a lot of voiices.  Sometimes understands them and other times doesn't but they always want the same thing and that is for her to kill herself. Did have SI around T'giving but not at this time.  Does not want to kill herself now.  Voices upset her a lot.  As noted M deeply concerned about the ECT affecting her memory.  Had gotten confused an missed lamotrigine for awhile.  Back on it for 10 days.  M had long conversation with Dr. Berdine Addison over the memory px and he mentioned the psychosis will interfere with memory as well..       Review of Systems:  Review of Systems  Neurological: Positive for tremors. Negative for weakness.  Psychiatric/Behavioral: Positive for decreased concentration, dysphoric mood, hallucinations and suicidal ideas. Negative for agitation, behavioral problems, confusion, self-injury and sleep disturbance. The patient is nervous/anxious. The patient is not hyperactive.     Medications: I have reviewed the patient's current medications.  Current Outpatient Medications  Medication Sig Dispense Refill  . b complex vitamins tablet Take 1 tablet by mouth daily.    . Cholecalciferol (VITAMIN D3) 5000 units CAPS Take 1 capsule by mouth daily.    . clonazePAM (KLONOPIN) 0.5 MG tablet Take 0.5 mg by mouth 2  (two) times daily. Take 1 tab two times a day & 2 tabs at bedtime as needed.    . donepezil (ARICEPT) 10 MG tablet Take 1 tablet (10 mg total) by mouth daily. 30 tablet 3  . lamoTRIgine (LAMICTAL) 200 MG tablet Take 200 mg by mouth daily.    [name] liothyronine (CYTOMEL) 25 MCG tablet Take 25 mcg by mouth every morning.    . lithium carbonate (LITHOBID) 300 MG CR tablet Take 2 tablets (600 mg total) by mouth 2 (two) times daily. 360 tablet 1  . loxapine (LOXITANE) 50 MG capsule Take 175 mg by mouth at bedtime. Take 3 (150 mg) at bedtime     . Melatonin 10 MG CAPS Take 10 mg by mouth.    . Multiple Vitamins-Minerals (MULTIVITAMIN ADULT) CHEW Chew by mouth.    . Omega-3 Fatty Acids (FISH OIL) 1200 MG CAPS Take by mouth.    . QUEtiapine (SEROQUEL) 25 MG tablet Take 25 mg by mouth 2 (two) times daily as needed (take 1-2 tabs as needed).    . sertraline (ZOLOFT) 100 MG tablet TAKE 2 TABLETS BY MOUTH EVERY DAY 180 tablet 1  . sertraline (ZOLOFT) 50 MG tablet TAKE 1 TABLET (50 MG TOTAL) BY MOUTH DAILY. TAKE 1 TAB (50 MG) DAILY WITH 2 TABS (100MG) 90 tablet 1  . traZODone (DESYREL) 100 MG tablet Take 100 mg by mouth at bedtime.    [name] loxapine (LOXITANE) 50 MG capsule Take 5 capsules (250 mg total) by mouth at bedtime. 450 capsule 0   No  current facility-administered medications for this visit.     Medication Side Effects: Other: tremor  Allergies:  Allergies  Allergen Reactions  . Metformin And Related   . Naltrexone     History reviewed. No pertinent past medical history.  History reviewed. No pertinent family history.  Social History   Socioeconomic History  . Marital status: Legally Separated    Spouse name: Not on file  . Number of children: Not on file  . Years of education: Not on file  . Highest education level: Not on file  Occupational History  . Not on file  Social Needs  . Financial resource strain: Not on file  . Food insecurity:    Worry: Not on file    Inability: Not on  file  . Transportation needs:    Medical: Not on file    Non-medical: Not on file  Tobacco Use  . Smoking status: Never Smoker  . Smokeless tobacco: Never Used  Substance and Sexual Activity  . Alcohol use: Not on file  . Drug use: Not on file  . Sexual activity: Not on file  Lifestyle  . Physical activity:    Days per week: Not on file    Minutes per session: Not on file  . Stress: Not on file  Relationships  . Social connections:    Talks on phone: Not on file    Gets together: Not on file    Attends religious service: Not on file    Active member of club or organization: Not on file    Attends meetings of clubs or organizations: Not on file    Relationship status: Not on file  . Intimate partner violence:    Fear of current or ex partner: Not on file    Emotionally abused: Not on file    Physically abused: Not on file    Forced sexual activity: Not on file  Other Topics Concern  . Not on file  Social History Narrative  . Not on file    Past Medical History, Surgical history, Social history, and Family history were reviewed and updated as appropriate.   Please see review of systems for further details on the patient's review from today.   Objective:   Physical Exam:  There were no vitals taken for this visit.  Physical Exam Constitutional:      Appearance: Normal appearance.  Neurological:     Mental Status: She is alert.     Motor: Tremor present.     Gait: Gait normal.     Comments: Moderate tremor.  Psychiatric:        Attention and Perception: Attention normal. She perceives auditory hallucinations.        Mood and Affect: Mood is anxious and depressed.        Speech: Speech normal.        Behavior: Behavior is not agitated or slowed.        Thought Content: Thought content is not paranoid. Thought content does not include homicidal or suicidal ideation.        Cognition and Memory: Cognition normal.        Judgment: Judgment normal.     Lab  Review:     Component Value Date/Time   NA 140 10/10/2010 1150   K 3.8 10/10/2010 1150   CL 108 10/10/2010 1150   CO2 25 10/10/2010 1150   GLUCOSE 99 10/10/2010 1150   BUN 6 10/10/2010 1150   CREATININE 0.72 10/10/2010 1150  CALCIUM 9.8 10/10/2010 1150   PROT 7.5 10/10/2010 0207   ALBUMIN 4.1 10/10/2010 0207   AST 23 10/10/2010 0207   ALT 19 10/10/2010 0207   ALKPHOS 40 10/10/2010 0207   BILITOT 0.4 10/10/2010 0207   GFRNONAA >60 10/10/2010 1150   GFRAA  10/10/2010 1150    >60        The eGFR has been calculated using the MDRD equation. This calculation has not been validated in all clinical situations. eGFR's persistently <60 mL/min signify possible Chronic Kidney Disease.       Component Value Date/Time   WBC 16.7 (H) 10/10/2010 0207   RBC 4.50 10/10/2010 0207   HGB 12.7 04/14/2011 0956   HCT 40.0 10/10/2010 0223   PLT 225 10/10/2010 0207   MCV 86.7 10/10/2010 0207   MCH 28.7 10/10/2010 0207   MCHC 33.1 10/10/2010 0207   RDW 12.9 10/10/2010 0207   LYMPHSABS 3.3 10/10/2010 0207   MONOABS 1.7 (H) 10/10/2010 0207   EOSABS 0.4 10/10/2010 0207   BASOSABS 0.1 10/10/2010 0207    No results found for: POCLITH, LITHIUM   No results found for: PHENYTOIN, PHENOBARB, VALPROATE, CBMZ  Last lithium level on 06/29/2018 was 1.3 which is typical for her.  She needs high normal lithium level for stability.  Her vitamin D level was 70 which is good. .res Assessment: Plan:    Schizoaffective disorder, bipolar type ([place]) - Plan: loxapine (LOXITANE) 50 MG capsule  Generalized anxiety disorder  Lithium-induced tremor  Low vitamin D level   Greater than 50% of face to face time with patient was spent on counseling and coordination of care. We discussed [place] was seen with her mother.  They are both concerned that her voices are worse.  She has had a worsening of depression recently and has required going to weekly bilateral ECT which has helped the depression to some  degree but was worsened her cognition.  There was extensive discussion with the patient and her mother about the long-term risk of cognitive impairment and specifically memory impairment from bilateral ECT.  It is expected to resolve most likely.  It is difficult to tell the difference between memory impairment caused by the ECT versus that caused by the chronic auditory hallucinations which interfere with concentration and memory also.  She is very distressed by the voices which are derogatory in and tell her to kill her self.  She does commit to safety at this time only suicidal.  I strongly encourage the continuation of ECT per recommendations of [facility]  Discussed safety plan at length with patient.  Advised patient to contact office with any worsening signs and symptoms.  Instructed patient to go to the [facility] emergency room for evaluation if experiencing any acute safety concerns, to include suicidal intent.   Extensive discussion of clozapine in detail.  Including the risk of severe neutropenia, marked weight gain, sedation, metabolic problems, cardiac risk, etc.  Discussed the need for weekly blood test for at least 6 months.  However we also discussed this is the most effective antipsychotic on the market.  It matter may better control her voices.  They wish to max out the loxapine before trying to switch to clozapine.  The maximum recommended dosage was discussed.  The risk of increased EPS and other side effects was discussed. Loxapine increase to 246m daily for 1 week then Increase 225 mg daily for 1 week, then  Increase to 250 mg daily.  If the loxapine is  not sufficiently effective for the auditory hallucinations we may consider switching to the clozapine after the holidays.  They are open to the idea.  She asked about the possibility of using a direct-current stimulator for depression and anxiety.  I indicated that the data supporting this is limited but the risk is very  low.  She will pursue the use of Alpha-Stim product.  Given the severity of her symptoms this seems like a reasonable trial given her treatment resistant status.  Her low vitamin D level is supplemented and resolved with supplemental vitamin D.  Vitamin D increases the risk of depression  This was a 45-minute appointment  Follow-up 4 to 6 weeks  Lynder Parents, MD, DFAPA Please see After Visit Summary for patient specific instructions.  Future Appointments  Date Time Provider [place]  08/04/2018  5:00 PM Cottle, Billey Co., MD CP-CP None    No orders of the defined types were placed in this encounter.     -------------------------------

## 2018-07-15 ENCOUNTER — Telehealth: Payer: Self-pay | Admitting: Psychiatry

## 2018-07-15 NOTE — Telephone Encounter (Signed)
Called [facility] and confirmed that loxapine dose of 250 mg is the maximum according to [facility] that is recommended but this is appropriate in this patient as she is tolerating the current dose of 200 mg daily and the next option is clozapine which is a considerably more risky medicine than this alternative.  The pharmacist agrees and the medicine will be approved.

## 2018-07-15 NOTE — Telephone Encounter (Signed)
Pharmacy called and said that they need clarification on the loxapine 50mg . They say 5 capsules is too high of a dose. Please call the pharmacy back at [phone]. Please use reference number [id number]

## 2018-08-04 ENCOUNTER — Ambulatory Visit: Payer: Medicare Other | Admitting: Psychiatry

## 2018-08-05 ENCOUNTER — Encounter: Payer: Self-pay | Admitting: Psychiatry

## 2018-08-05 ENCOUNTER — Ambulatory Visit (INDEPENDENT_AMBULATORY_CARE_PROVIDER_SITE_OTHER): Payer: Medicare Other | Admitting: Psychiatry

## 2018-08-05 DIAGNOSIS — F250 Schizoaffective disorder, bipolar type: Secondary | ICD-10-CM

## 2018-08-05 DIAGNOSIS — G251 Drug-induced tremor: Secondary | ICD-10-CM

## 2018-08-05 DIAGNOSIS — R7989 Other specified abnormal findings of blood chemistry: Secondary | ICD-10-CM | POA: Diagnosis not present

## 2018-08-05 DIAGNOSIS — F411 Generalized anxiety disorder: Secondary | ICD-10-CM

## 2018-08-05 DIAGNOSIS — F4001 Agoraphobia with panic disorder: Secondary | ICD-10-CM | POA: Diagnosis not present

## 2018-08-05 MED ORDER — BUPROPION HCL ER (XL) 150 MG PO TB24: ORAL_TABLET | ORAL | 1 refills | Status: DC

## 2018-08-05 NOTE — Progress Notes (Signed)
[facility] [id number] 1980/08/13 38 y.o.  Subjective:   Patient ID:  Laura Mathews is a 38 y.o. (DOB 04-17-1981) female.  Chief Complaint:  Chief Complaint  Patient presents with  . Follow-up    Medication Management    HPI last seen July 06, 2018.  Seen with mother Cleda Clarks. Markeia Harkless Frazer presents to the office today for follow-up of schizoaffective depression and anxiety.  At the last visit we elected to increase the loxapine to the maximum dosage over 2-week period of time of 250 mg daily for the hallucinations which she achieved on Jan 13  She had forgotten to increase it..  It was hoped it might also help with the depression.  Was getting ECT every Friday and able to skip this week bc depression has been better.  Voices are not great but wants to focus on the depression, bc it inhibits life more than does the voices.  Used to believe the voices as God and no longer believes them and doesn't hear voices telling her to kill herself.  Voices are annoying and distracted but not fearful.  She doesn't want to try the clozapine.  No full panic but is often anxious daily.  Is driving.    Struggling with depression since here and started doing ECT weekly and that has helped the depression.  She wants to try to do everything she can to try to end the necessity of ECT.  Wonders about other meds for depression.  Mother says she forgets how she's feeling day to day.  "Struggling with psychosis" and ECT docs recommended clozapine.  Clouds my brain and worse when she's under stress even normal things like cooking and trouble following a recipe.  Hearing a lot of voiices.  Sometimes understands them and other times doesn't but they always want the same thing and that is for her to kill herself. Did have SI around T'giving but not at this time.  Does not want to kill herself now.  Voices upset her to varying degrees at different times.  As noted M deeply concerned  about the ECT affecting her memory.  Had gotten confused an missed lamotrigine for awhile.  Back on it for 10 days.  M had long conversation with Dr. Berdine Addison over the memory px and he mentioned the psychosis will interfere with memory as well..   Review of Systems:  Review of Systems  Neurological: Positive for tremors. Negative for weakness.  Psychiatric/Behavioral: Positive for decreased concentration, dysphoric mood, hallucinations and suicidal ideas. Negative for agitation, behavioral problems, confusion, self-injury and sleep disturbance. The patient is nervous/anxious. The patient is not hyperactive.     Medications: I have reviewed the patient's current medications.  Current Outpatient Medications  Medication Sig Dispense Refill  . b complex vitamins tablet Take 1 tablet by mouth daily.    . Cholecalciferol (VITAMIN D3) 5000 units CAPS Take 1 capsule by mouth daily.    . clonazePAM (KLONOPIN) 0.5 MG tablet Take 0.5 mg by mouth 2 (two) times daily. Take 1 tab two times a day & 2 tabs at bedtime as needed.    . lamoTRIgine (LAMICTAL) 200 MG tablet Take 200 mg by mouth daily.    [name] liothyronine (CYTOMEL) 25 MCG tablet Take 25 mcg by mouth every morning.    . lithium carbonate (LITHOBID) 300 MG CR tablet Take 2 tablets (600 mg total) by mouth 2 (two) times daily. 360 tablet 1  . loxapine (LOXITANE) 50 MG capsule Take 5 capsules (  250 mg total) by mouth at bedtime. 450 capsule 0  . Melatonin 10 MG CAPS Take 10 mg by mouth.    . Multiple Vitamins-Minerals (MULTIVITAMIN ADULT) CHEW Chew by mouth.    . Omega-3 Fatty Acids (FISH OIL) 1200 MG CAPS Take by mouth.    . QUEtiapine (SEROQUEL) 25 MG tablet Take 25 mg by mouth 2 (two) times daily as needed (take 1-2 tabs as needed).    . sertraline (ZOLOFT) 100 MG tablet TAKE 2 TABLETS BY MOUTH EVERY DAY 180 tablet 1  . sertraline (ZOLOFT) 50 MG tablet TAKE 1 TABLET (50 MG TOTAL) BY MOUTH DAILY. TAKE 1 TAB (50 MG) DAILY WITH 2 TABS (100MG) 90 tablet 1  .  traZODone (DESYREL) 100 MG tablet Take 100 mg by mouth at bedtime.    [name] buPROPion (WELLBUTRIN XL) 150 MG 24 hr tablet 1 each morning for 1 week, then 2 each morning. 30 tablet 1   No current facility-administered medications for this visit.     Medication Side Effects: Other: tremor  Allergies:  Allergies  Allergen Reactions  . Metformin And Related   . Naltrexone     History reviewed. No pertinent past medical history.  History reviewed. No pertinent family history.  Social History   Socioeconomic History  . Marital status: Legally Separated    Spouse name: Not on file  . Number of children: Not on file  . Years of education: Not on file  . Highest education level: Not on file  Occupational History  . Not on file  Social Needs  . Financial resource strain: Not on file  . Food insecurity:    Worry: Not on file    Inability: Not on file  . Transportation needs:    Medical: Not on file    Non-medical: Not on file  Tobacco Use  . Smoking status: Never Smoker  . Smokeless tobacco: Never Used  Substance and Sexual Activity  . Alcohol use: Not on file  . Drug use: Not on file  . Sexual activity: Not on file  Lifestyle  . Physical activity:    Days per week: Not on file    Minutes per session: Not on file  . Stress: Not on file  Relationships  . Social connections:    Talks on phone: Not on file    Gets together: Not on file    Attends religious service: Not on file    Active member of club or organization: Not on file    Attends meetings of clubs or organizations: Not on file    Relationship status: Not on file  . Intimate partner violence:    Fear of current or ex partner: Not on file    Emotionally abused: Not on file    Physically abused: Not on file    Forced sexual activity: Not on file  Other Topics Concern  . Not on file  Social History Narrative  . Not on file    Past Medical History, Surgical history, Social history, and Family history were  reviewed and updated as appropriate.   Please see review of systems for further details on the patient's review from today.   Objective:   Physical Exam:  There were no vitals taken for this visit.  Physical Exam Constitutional:      Appearance: Normal appearance.  Neurological:     Mental Status: She is alert.     Motor: Tremor present.     Gait: Gait normal.  Comments: Moderate tremor.  Psychiatric:        Attention and Perception: Attention normal. She perceives auditory hallucinations.        Mood and Affect: Mood is anxious and depressed.        Speech: Speech normal.        Behavior: Behavior is not agitated or slowed.        Thought Content: Thought content is not paranoid. Thought content does not include homicidal or suicidal ideation.        Cognition and Memory: Cognition normal.        Judgment: Judgment normal.     Lab Review:     Component Value Date/Time   NA 140 10/10/2010 1150   K 3.8 10/10/2010 1150   CL 108 10/10/2010 1150   CO2 25 10/10/2010 1150   GLUCOSE 99 10/10/2010 1150   BUN 6 10/10/2010 1150   CREATININE 0.72 10/10/2010 1150   CALCIUM 9.8 10/10/2010 1150   PROT 7.5 10/10/2010 0207   ALBUMIN 4.1 10/10/2010 0207   AST 23 10/10/2010 0207   ALT 19 10/10/2010 0207   ALKPHOS 40 10/10/2010 0207   BILITOT 0.4 10/10/2010 0207   GFRNONAA >60 10/10/2010 1150   GFRAA  10/10/2010 1150    >60        The eGFR has been calculated using the MDRD equation. This calculation has not been validated in all clinical situations. eGFR's persistently <60 mL/min signify possible Chronic Kidney Disease.       Component Value Date/Time   WBC 16.7 (H) 10/10/2010 0207   RBC 4.50 10/10/2010 0207   HGB 12.7 04/14/2011 0956   HCT 40.0 10/10/2010 0223   PLT 225 10/10/2010 0207   MCV 86.7 10/10/2010 0207   MCH 28.7 10/10/2010 0207   MCHC 33.1 10/10/2010 0207   RDW 12.9 10/10/2010 0207   LYMPHSABS 3.3 10/10/2010 0207   MONOABS 1.7 (H) 10/10/2010 0207    EOSABS 0.4 10/10/2010 0207   BASOSABS 0.1 10/10/2010 0207    Lithium Lvl  Date Value Ref Range Status  06/29/2018 1.3 (H) 0.6 - 1.2 mmol/L Final    Comment:                                     Detection Limit = 0.1                           <0.1 indicates None Detected Patient drug level exceeds published reference range.  Evaluate clinically for signs of potential toxicity.      No results found for: PHENYTOIN, PHENOBARB, VALPROATE, CBMZ  Last lithium level on 06/29/2018 was 1.3 which is typical for her.  She needs high normal lithium level for stability.  Her vitamin D level was 70 which is good. .res Assessment: Plan:    Schizoaffective disorder, bipolar type ([place])  Generalized anxiety disorder  Panic disorder with agoraphobia  Low serum vitamin D  Lithium-induced tremor   Start keeping mood charts again.    Disc her fears of the clozapine re SE dizziness, weight gain, etc.  Likes the SE profile of the loxapine.  She asked to review her psych med history for depression:  Never taken Vraylar, amantadine, pramipexole, Wellbutrin.  Disc her ? History of 1 possible seizure, but she now is saying it was probably a passing out episode which has been a chronic problem.  Disc risk Sz with Wellbutrin but lamotrigine should decrease the risk.  They both accept the risk.  Start 150 for 1 week then 300 mg daily.  Greater than 50% of face to face time with patient was spent on counseling and coordination of care. We discussed [place] was seen with her mother.  They are both concerned that her voices are worse.  She has had a worsening of depression recently and has required going to weekly bilateral ECT which has helped the depression to some degree but was worsened her cognition.  There was extensive discussion with the patient and her mother about the long-term risk of cognitive impairment and specifically memory impairment from bilateral ECT.  It is expected to resolve most likely.  It  is difficult to tell the difference between memory impairment caused by the ECT versus that caused by the chronic auditory hallucinations which interfere with concentration and memory also.  She is very distressed by the voices which are derogatory in and tell her to kill her self.  She does commit to safety at this time only suicidal.  I strongly encourage the continuation of ECT per recommendations of [facility].  Discussed safety plan at length with patient.  Advised patient to contact office with any worsening signs and symptoms.  Instructed patient to go to the [facility] emergency room for evaluation if experiencing any acute safety concerns, to include suicidal intent.  Extensive discussion of clozapine in detail.  Including the risk of severe neutropenia, marked weight gain, sedation, metabolic problems, cardiac risk, etc.  Discussed the need for weekly blood test for at least 6 months.  However we also discussed this is the most effective antipsychotic on the market.  It matter may better control her voices.  They wish to max out the loxapine before trying to switch to clozapine.  The maximum recommended dosage was discussed.  The risk of increased EPS and other side effects was discussed. Loxapine continue 250 mg daily.  If the loxapine is not sufficiently effective for the auditory hallucinations we may consider switching to the clozapine after the holidays.  They are open to the idea.  She asked about the possibility of using a direct-current stimulator for depression and anxiety.  I indicated that the data supporting this is limited but the risk is very low.  She will pursue the use of Alpha-Stim product.  Given the severity of her symptoms this seems like a reasonable trial given her treatment resistant status.  Her low vitamin D level is supplemented and resolved with supplemental vitamin D.  Vitamin D increases the risk of depression  This was a 45-minute appointment  Follow-up  7 weeks  Lynder Parents, MD, DFAPA Please see After Visit Summary for patient specific instructions.  No future appointments.  No orders of the defined types were placed in this encounter.     -------------------------------

## 2018-08-08 ENCOUNTER — Other Ambulatory Visit: Payer: Self-pay | Admitting: Psychiatry

## 2018-08-19 ENCOUNTER — Telehealth: Payer: Self-pay | Admitting: Psychiatry

## 2018-08-19 ENCOUNTER — Ambulatory Visit: Payer: Medicare Other | Admitting: Psychiatry

## 2018-08-19 NOTE — Telephone Encounter (Signed)
Called and Pt. Verbalized understanding. As soon as she gets the numbers for her blood pressures she will give [place] a call back.

## 2018-08-19 NOTE — Telephone Encounter (Signed)
The simplest way to answer this question is to check your blood pressure and pulse and record them after lying still for 15 minutes.  Then stand up weight 1 to 2 minutes and repeat the blood pressure and pulse.  Call us back with those numbers.  Meredith Staggersarey Cottle, MD, DFAPA

## 2018-08-19 NOTE — Telephone Encounter (Signed)
Patient passed out 3 days ago, was diagnosed as having orthostatic hypertension, patient seems to think it was anxiety, per EMT suggested she make an appointment with a cardiologist, pt wants to know if she needs to make this appointment?  Please advise

## 2018-08-26 ENCOUNTER — Telehealth: Payer: Self-pay | Admitting: Psychiatry

## 2018-08-26 NOTE — Telephone Encounter (Signed)
Patient stated she was prescribed Wellbutrin to take 1 per day for 1 week then 2 per day for 1 week for the first month, patient stated when she went to get refill for month 2 the same dosage was prescribed.  Please advise not sure if this is correct

## 2018-08-26 NOTE — Telephone Encounter (Signed)
Instructed pt to take 2/day, pharmacy did not remove the first month directions so it was confusing. Verified with pt to check the quantity and make sure its #60 Call back with any questions and concerns

## 2018-09-05 ENCOUNTER — Other Ambulatory Visit: Payer: Self-pay

## 2018-09-05 ENCOUNTER — Telehealth: Payer: Self-pay | Admitting: Psychiatry

## 2018-09-05 MED ORDER — BUPROPION HCL ER (XL) 150 MG PO TB24: ORAL_TABLET | ORAL | 1 refills | Status: DC

## 2018-09-05 NOTE — Telephone Encounter (Signed)
Updated rx submitted with correct quantity

## 2018-09-05 NOTE — Telephone Encounter (Signed)
Pt is to take 2 per day of Wellbutrin and last RX was for #30 pills only. Please correct the refill for #60/month [facility] [place].

## 2018-09-14 ENCOUNTER — Telehealth: Payer: Self-pay | Admitting: Psychiatry

## 2018-09-14 ENCOUNTER — Other Ambulatory Visit: Payer: Self-pay

## 2018-09-14 MED ORDER — LIOTHYRONINE SODIUM 25 MCG PO TABS: 25.0000 ug | ORAL_TABLET | ORAL | 5 refills | Status: DC

## 2018-09-14 NOTE — Telephone Encounter (Signed)
Need to pull paper chart not listed in epic chart

## 2018-09-14 NOTE — Telephone Encounter (Signed)
Patient requesting refill of her Klonopin. [facility] fill at [facility] [address] in [place].

## 2018-09-15 ENCOUNTER — Other Ambulatory Visit: Payer: Self-pay

## 2018-09-15 MED ORDER — CLONAZEPAM 0.5 MG PO TABS: ORAL_TABLET | ORAL | 0 refills | Status: DC

## 2018-09-15 NOTE — Progress Notes (Signed)
Pt requested refill on clonazepam 0.5 mg 1 bid and 2 at hs. Doesn't take very often.

## 2018-09-15 NOTE — Telephone Encounter (Signed)
rx submitted to [facility] per pt's request

## 2018-09-16 ENCOUNTER — Telehealth: Payer: Self-pay | Admitting: Psychiatry

## 2018-09-16 NOTE — Telephone Encounter (Signed)
Patient called last night on 09/15/2018 at 8 PM.  She was having inside voices telling her to kill herself.  She has had voices in her head before this is increased over the past 2 days she was somewhat confused also the voices are telling her to she is cheating on her boyfriend.  He denies visual hallucinations.  Delusions he says God sends angels to whisper to her.  She has paranoia for 2 days. She has had some suicidal thoughts with a knife.  She also has made an acquaintance with a gentleman at [facility] who she feels is not taking his meds.  Patient is on loxapine Seroquel Lamictal, lithium, Cytomel, Zoloft, trazodone, Klonopin, Wellbutrin.  She is able to commit to safety.  She lives with her boyfriend and he can help take care of her.

## 2018-09-16 NOTE — Telephone Encounter (Signed)
Pt given information and verbalized understanding of medication adjustments. Instructed to call back with any questions or concerns. Go to ER if not feeling safe over the weekend. Has follow up Monday with provider. Doesn't need any medication called in today.

## 2018-09-16 NOTE — Telephone Encounter (Signed)
Patient is very sensitive to EPS from antipsychotics.  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  However she is having command auditory hallucinations to kill her self.  Given the severity of the auditory hallucinations and she is at maximum usual recommended dose of loxapine.  The options immediately are to exceed the usual recommendations of loxapine dosing or add a high potency low-dose antipsychotic to the loxapine.  Patient has a history of having EPS on risperidone 3.5 mg daily.  Clozapine could be considered but it is not something we can start immediately.  Therefore, the safest option appears to be increasing loxapine to 300 mg nightly.  Keep the Seroquel dose as low as possible and use only the lowest dose that is effective for her sleep.  Iona Hansen, MD, DFAPA

## 2018-09-16 NOTE — Telephone Encounter (Signed)
Laura Mathews called to report that she is not doing well. She is hearing voices talking about her and God telling her to kill herself. Please call to tell what she should do.

## 2018-09-19 ENCOUNTER — Encounter: Payer: Self-pay | Admitting: Psychiatry

## 2018-09-19 ENCOUNTER — Ambulatory Visit (INDEPENDENT_AMBULATORY_CARE_PROVIDER_SITE_OTHER): Payer: Medicare Other | Admitting: Psychiatry

## 2018-09-19 DIAGNOSIS — F411 Generalized anxiety disorder: Secondary | ICD-10-CM | POA: Diagnosis not present

## 2018-09-19 DIAGNOSIS — R7989 Other specified abnormal findings of blood chemistry: Secondary | ICD-10-CM | POA: Diagnosis not present

## 2018-09-19 DIAGNOSIS — G251 Drug-induced tremor: Secondary | ICD-10-CM

## 2018-09-19 DIAGNOSIS — R55 Syncope and collapse: Secondary | ICD-10-CM

## 2018-09-19 DIAGNOSIS — F250 Schizoaffective disorder, bipolar type: Secondary | ICD-10-CM | POA: Diagnosis not present

## 2018-09-19 MED ORDER — BUPROPION HCL ER (XL) 150 MG PO TB24: 150.0000 mg | ORAL_TABLET | Freq: Every day | ORAL | 1 refills | Status: DC

## 2018-09-19 MED ORDER — LOXAPINE SUCCINATE 50 MG PO CAPS: 300.0000 mg | ORAL_CAPSULE | Freq: Every day | ORAL | 0 refills | Status: DC

## 2018-09-19 NOTE — Progress Notes (Signed)
[name] [id number] Sep 30, 1980 38 y.o.  Subjective:   Patient ID:  Laura Mathews is a 38 y.o. (DOB 09-16-1980) female.  Chief Complaint:  Chief Complaint  Patient presents with  . Follow-up    Medication Management  . Depression  . Anxiety  . Hallucinations    HPI last seen January 17 th 2020 seen with partner Laura Mathews. [name] presents to the office today for follow-up of schizoaffective depression with hallucinations and anxiety.  She is made a lot of phone calls since she was last here.  Also had not ER visit for syncope.  She called on February 27 after hours stating she was hearing voices telling her to kill her self though she had no intent or plan.  The voices have been more intense and more scary recently.  She had more delusions of a spiritual nature as well.  At that time psychiatric medication history, which is extensive, was reviewed and the decision was made to increase the loxapine from 250 mg/day to 300 mg daily as the safest option for treating her psychosis 4 days ago.   Voices have increased the anxiety.  BP higher.  Feels sleepier taking the quetiapine 50 with loxapine 300.  No other SE with it.  Tolerated it pretty well.   Persistent AH for a long time but Thursday was more overwhelming and powerful.   Had just gotten over the flu.    Depression under better control for the month.  Kept a mood chart for the month and it was stable.  Thinks the depression may be helped by the Wellbutrin.  Laura Mathews asked about Dr. Olga Mathews approach.  Was getting ECT every Friday and able to skip this week bc depression has been better.  No full panic but is often anxious daily.  Is driving.    Struggling with depression since here and started doing ECT weekly and that has helped the depression.  She wants to try to do everything she can to try to end the necessity of ECT.  At this time she cannot get past about 4 weeks without the ECT because  depression  Worsens.    "Struggling with psychosis" and ECT docs recommended clozapine.  Clouds my brain and worse when she's under stress even normal things like cooking and trouble following a recipe.  Hearing a lot of voices.  Sometimes understands them and other times doesn't but they always want the same thing and that is for her to kill herself. Does not want to kill herself now.  Voices upset her to varying degrees at different times and have been worse recently.  Psychiatric medication history includes risperidone 3.5 mg EPS, Abilify 30 mg withdrawal dyskinesia, perphenazine, Geodon, olanzapine, Saphris 5 mg, Haldol, Latuda 160, loxapine 250 milligrams, Seroquel,  lamotrigine, Depakote,, lithium, clonazepam,  Fetzima, sertraline, Pristiq,, paroxetine,  citalopram, Lexapro, buspirone, Cytomel, topiramate, buspirone,, gabapentin, trazodone propranolol with no response for anxiety ECT maintenance Under our psychiatric care since 2006.  Remote history of OD Klonopin.  Review of Systems:  Review of Systems  Neurological: Positive for tremors. Negative for weakness.  Psychiatric/Behavioral: Positive for decreased concentration and hallucinations. Negative for agitation, behavioral problems, confusion, dysphoric mood, self-injury, sleep disturbance and suicidal ideas. The patient is nervous/anxious. The patient is not hyperactive.     Medications: I have reviewed the patient's current medications.  Current Outpatient Medications  Medication Sig Dispense Refill  . b complex vitamins tablet Take 1 tablet by mouth daily.    [name]  buPROPion (WELLBUTRIN XL) 150 MG 24 hr tablet 1 each morning for 1 week, then 2 each morning. 60 tablet 1  . Cholecalciferol (VITAMIN D3) 5000 units CAPS Take 1 capsule by mouth daily.    . clonazePAM (KLONOPIN) 0.5 MG tablet Take 1 tab two times a day & 2 tabs at bedtime as needed. 100 tablet 0  . fluticasone (FLONASE) 50 MCG/ACT nasal spray 1 spray by Each Nare route  daily.    [name] lamoTRIgine (LAMICTAL) 200 MG tablet Take 200 mg by mouth daily.    [name] liothyronine (CYTOMEL) 25 MCG tablet Take 1 tablet (25 mcg total) by mouth every morning. 30 tablet 5  . lithium carbonate (LITHOBID) 300 MG CR tablet Take 2 tablets (600 mg total) by mouth 2 (two) times daily. 360 tablet 1  . loxapine (LOXITANE) 50 MG capsule Take 5 capsules (250 mg total) by mouth at bedtime. (Patient taking differently: Take 300 mg by mouth at bedtime. ) 450 capsule 0  . Melatonin 10 MG CAPS Take 10 mg by mouth.    . Multiple Vitamins-Minerals (MULTIVITAMIN ADULT) CHEW Chew by mouth.    . Omega-3 Fatty Acids (FISH OIL) 1200 MG CAPS Take by mouth.    . QUEtiapine (SEROQUEL) 25 MG tablet TAKE 1 TO 2 TABLETS BY MOUTH TWICE A DAY AS NEEDED ANXIETY 360 tablet 0  . sertraline (ZOLOFT) 100 MG tablet TAKE 2 TABLETS BY MOUTH EVERY DAY 180 tablet 1  . sertraline (ZOLOFT) 50 MG tablet TAKE 1 TABLET (50 MG TOTAL) BY MOUTH DAILY. TAKE 1 TAB (50 MG) DAILY WITH 2 TABS (100MG) (Patient taking differently: 25 mg. ) 90 tablet 1  . traZODone (DESYREL) 100 MG tablet Take 100 mg by mouth at bedtime.     No current facility-administered medications for this visit.     Medication Side Effects: Other: tremor, grinding teeth daytime.  Allergies:  Allergies  Allergen Reactions  . Metformin And Related   . Naltrexone     History reviewed. No pertinent past medical history.  History reviewed. No pertinent family history.  Social History   Socioeconomic History  . Marital status: Legally Separated    Spouse name: Not on file  . Number of children: Not on file  . Years of education: Not on file  . Highest education level: Not on file  Occupational History  . Not on file  Social Needs  . Financial resource strain: Not on file  . Food insecurity:    Worry: Not on file    Inability: Not on file  . Transportation needs:    Medical: Not on file    Non-medical: Not on file  Tobacco Use  . Smoking status:  Never Smoker  . Smokeless tobacco: Never Used  Substance and Sexual Activity  . Alcohol use: Not on file  . Drug use: Not on file  . Sexual activity: Not on file  Lifestyle  . Physical activity:    Days per week: Not on file    Minutes per session: Not on file  . Stress: Not on file  Relationships  . Social connections:    Talks on phone: Not on file    Gets together: Not on file    Attends religious service: Not on file    Active member of club or organization: Not on file    Attends meetings of clubs or organizations: Not on file    Relationship status: Not on file  . Intimate partner violence:    Fear  of current or ex partner: Not on file    Emotionally abused: Not on file    Physically abused: Not on file    Forced sexual activity: Not on file  Other Topics Concern  . Not on file  Social History Narrative  . Not on file    Past Medical History, Surgical history, Social history, and Family history were reviewed and updated as appropriate.   Please see review of systems for further details on the patient's review from today.   Objective:   Physical Exam:  There were no vitals taken for this visit.  Physical Exam Constitutional:      Appearance: Normal appearance.  Neurological:     Mental Status: She is alert.     Motor: Tremor present.     Gait: Gait normal.     Comments: Moderate tremor.  Psychiatric:        Attention and Perception: Attention normal. She perceives auditory hallucinations.        Mood and Affect: Mood is anxious. Mood is not depressed.        Speech: Speech normal.        Behavior: Behavior is not agitated or slowed.        Thought Content: Thought content is not paranoid. Thought content does not include homicidal or suicidal ideation.        Cognition and Memory: Cognition normal.        Judgment: Judgment normal.     Comments: Very mild cogwheel rigidity R greather than left.     Lab Review:     Component Value Date/Time   NA 140  10/10/2010 1150   K 3.8 10/10/2010 1150   CL 108 10/10/2010 1150   CO2 25 10/10/2010 1150   GLUCOSE 99 10/10/2010 1150   BUN 6 10/10/2010 1150   CREATININE 0.72 10/10/2010 1150   CALCIUM 9.8 10/10/2010 1150   PROT 7.5 10/10/2010 0207   ALBUMIN 4.1 10/10/2010 0207   AST 23 10/10/2010 0207   ALT 19 10/10/2010 0207   ALKPHOS 40 10/10/2010 0207   BILITOT 0.4 10/10/2010 0207   GFRNONAA >60 10/10/2010 1150   GFRAA  10/10/2010 1150    >60        The eGFR has been calculated using the MDRD equation. This calculation has not been validated in all clinical situations. eGFR's persistently <60 mL/min signify possible Chronic Kidney Disease.       Component Value Date/Time   WBC 16.7 (H) 10/10/2010 0207   RBC 4.50 10/10/2010 0207   HGB 12.7 04/14/2011 0956   HCT 40.0 10/10/2010 0223   PLT 225 10/10/2010 0207   MCV 86.7 10/10/2010 0207   MCH 28.7 10/10/2010 0207   MCHC 33.1 10/10/2010 0207   RDW 12.9 10/10/2010 0207   LYMPHSABS 3.3 10/10/2010 0207   MONOABS 1.7 (H) 10/10/2010 0207   EOSABS 0.4 10/10/2010 0207   BASOSABS 0.1 10/10/2010 0207    Lithium Lvl  Date Value Ref Range Status  06/29/2018 1.3 (H) 0.6 - 1.2 mmol/L Final    Comment:                                     Detection Limit = 0.1                           <0.1 indicates None Detected Patient drug level exceeds published  reference range.  Evaluate clinically for signs of potential toxicity.      No results found for: PHENYTOIN, PHENOBARB, VALPROATE, CBMZ  Last lithium level on 06/29/2018 was 1.3 which is typical for her.  She needs high normal lithium level for stability.  Her vitamin D level was 70 which is good. .res Assessment: Plan:    Schizoaffective disorder, bipolar type ([facility])  Generalized anxiety disorder  Lithium-induced tremor  Low serum vitamin D  Syncope and collapse   Start keeping mood charts again.    Greater than 50% of face to face time with patient was spent on counseling and  coordination of care.  Discussed her case with both patient and her partner Laura Mathews in the room at their request.  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  Disc her fears of the clozapine re SE dizziness, weight gain, etc.  Likes the SE profile of the loxapine. It was increased on 09/16/2018 for worsening AH telling her to kill herself.  It is too early to judge the effectiveness of that increase.  Answered questions about alternative medicine approaches.  Laura Mathews was wondering about an evaluation by Dr. Chipper Oman.  I have no objection although his approach is not considered mainstream psychiatry.  There also may be cost barriers.  Discussed t the relationship between inflammation and worsening psychiatric symptoms such as voices or depression, given that she had the flu recently  We reviewed options for treating depression with the hope of eventually eliminating ECT:  Never taken Vraylar, amantadine, pramipexole, Wellbutrin.  Disc her ? History of 1 possible seizure, but she now is saying it was probably a passing out episode which has been a chronic problem.  Reduce the Wellbutrin to 150 bc of the anxiety and the higher recent BP.  Depression is better so far witht  The Wellbutrin.  Disc alternative TMS to ECT.  Just she talked to the ECT team in [facility] about this.  Extensive discussion of clozapine in detail.  Including the risk of severe neutropenia, marked weight gain, sedation, metabolic problems, cardiac risk, etc.  Discussed the need for weekly blood test for at least 6 months.  However we also discussed this is the most effective antipsychotic on the market.  It matter may better control her voices.  They wish to max out the loxapine before trying to switch to clozapine.  The maximum recommended dosage was discussed.  The risk of increased EPS and other  side effects was discussed. Loxapine continue 300 mg daily.  The loxapine needs more time to reach its maximal maximum potential benefit.  If the loxapine is not sufficiently effective for the auditory hallucinations we may consider switching to the clozapine.  They are open to the idea.  Her low vitamin D level is supplemented and resolved with supplemental vitamin D.  Low Vitamin D increases the risk of depression  This was a 45-minute appointment  Follow-up 6 weeks  Lynder Parents, MD, DFAPA  Please see After Visit Summary for patient specific instructions.  Future Appointments  Date Time Provider [place]  10/31/2018  4:00 PM Cottle, Billey Co., MD [facility] None    No orders of the defined types were placed in this encounter.     -------------------------------

## 2018-10-03 ENCOUNTER — Telehealth: Payer: Self-pay | Admitting: Psychiatry

## 2018-10-03 ENCOUNTER — Other Ambulatory Visit: Payer: Self-pay

## 2018-10-03 MED ORDER — BUPROPION HCL ER (XL) 150 MG PO TB24: 150.0000 mg | ORAL_TABLET | Freq: Every day | ORAL | 0 refills | Status: DC

## 2018-10-03 MED ORDER — LIOTHYRONINE SODIUM 25 MCG PO TABS: 25.0000 ug | ORAL_TABLET | ORAL | 0 refills | Status: DC

## 2018-10-03 MED ORDER — QUETIAPINE FUMARATE 25 MG PO TABS: ORAL_TABLET | ORAL | 0 refills | Status: DC

## 2018-10-03 NOTE — Telephone Encounter (Signed)
Patient has called and would like a 90 supply of the wellbutrin xl 150mg , cytomel and seroquel 25mg  [facility] [address] [facility] [place]

## 2018-10-03 NOTE — Telephone Encounter (Signed)
All 3 rx's submitted for 90 day per request, next office visit 10/31/2018

## 2018-10-31 ENCOUNTER — Encounter: Payer: Self-pay | Admitting: Psychiatry

## 2018-10-31 ENCOUNTER — Ambulatory Visit (INDEPENDENT_AMBULATORY_CARE_PROVIDER_SITE_OTHER): Payer: Medicare Other | Admitting: Psychiatry

## 2018-10-31 ENCOUNTER — Other Ambulatory Visit: Payer: Self-pay

## 2018-10-31 DIAGNOSIS — G251 Drug-induced tremor: Secondary | ICD-10-CM

## 2018-10-31 DIAGNOSIS — R55 Syncope and collapse: Secondary | ICD-10-CM

## 2018-10-31 DIAGNOSIS — F411 Generalized anxiety disorder: Secondary | ICD-10-CM

## 2018-10-31 DIAGNOSIS — F4001 Agoraphobia with panic disorder: Secondary | ICD-10-CM

## 2018-10-31 DIAGNOSIS — Z79899 Other long term (current) drug therapy: Secondary | ICD-10-CM

## 2018-10-31 DIAGNOSIS — F250 Schizoaffective disorder, bipolar type: Secondary | ICD-10-CM | POA: Diagnosis not present

## 2018-10-31 DIAGNOSIS — R7989 Other specified abnormal findings of blood chemistry: Secondary | ICD-10-CM

## 2018-10-31 MED ORDER — LIOTHYRONINE SODIUM 25 MCG PO TABS: 50.0000 ug | ORAL_TABLET | ORAL | 0 refills | Status: DC

## 2018-10-31 NOTE — Progress Notes (Addendum)
[facility] [id number] 1980/08/12 38 y.o.  Subjective:   Patient ID:  Laura Mathews is a 38 y.o. (DOB [birth year]) female.  Chief Complaint:  Chief Complaint  Patient presents with  . Follow-up    Medication management  . Depression  . Anxiety  . Medication Problem    tremor    HPI l [name] presents  today for follow-up of schizoaffective depression with hallucinations and anxiety.  Last visit was September 19, 2018.  At that visit we reduced Wellbutrin XL 300 to 150 because of anxiety and recently high blood pressure.  No problems from reducing the wellbutrin.  Anxiety is better and the depression is much better.  Spring is a better season for her mood.  Coping pretty welll with the Covid virus.  Sister in charge of hospital response to Covid virus.  Patient reports stable mood and denies depressed or irritable moods.  Patient reduced recent difficulty with anxiety.  Walking more has helped and less depression improved motivation. Patient denies difficulty with sleep initiation or maintenance. Denies appetite disturbance.  Patient reports that energy and motivation have been good.  Patient denies any difficulty with concentration.  Patient denies any suicidal ideation.  No recent panic.  Overall voices less but daily. Quieter and less understandable and doesn't tell her anything now. Quiet noise.  No further syncope.  She called on February 27 after hours stating she was hearing voices telling her to kill her self though she had no intent or plan.  The voices have been more intense and more scary recently.  She had more delusions of a spiritual nature as well.  At that time psychiatric medication history, which is extensive, was reviewed and the decision was made to increase the loxapine from 250 mg/day to 300 mg daily as the safest option for treating her psychosis 4 days ago.   Voices have increased the anxiety.   Thinks the depression may be helped  by the Wellbutrin. Asks about reducing the sertraline in goal of less meds.  ECT to monthly.  Helped memory.  Psychiatric medication history includes risperidone 3.5 mg EPS, Abilify 30 mg withdrawal dyskinesia, perphenazine, Geodon, olanzapine, Saphris 5 mg, Haldol, Latuda 160, loxapine 300 milligrams, Seroquel,  lamotrigine, Depakote,, lithium, clonazepam,  Fetzima, sertraline, Pristiq,, paroxetine,  citalopram, Lexapro, buspirone, Cytomel, topiramate, buspirone,, gabapentin, trazodone propranolol with no response for anxiety ECT maintenance Under our psychiatric care since 2006.  Remote history of OD Klonopin.  Review of Systems:  Review of Systems  Constitutional: Positive for fatigue.  Cardiovascular:       BP higher 120-130/95.  Neurological: Positive for tremors. Negative for weakness and headaches.  Psychiatric/Behavioral: Positive for decreased concentration. Negative for agitation, behavioral problems, confusion, dysphoric mood, hallucinations, self-injury, sleep disturbance and suicidal ideas. The patient is not nervous/anxious and is not hyperactive.     Medications: I have reviewed the patient's current medications.  Current Outpatient Medications  Medication Sig Dispense Refill  . b complex vitamins tablet Take 1 tablet by mouth daily.    [name] buPROPion (WELLBUTRIN XL) 150 MG 24 hr tablet Take 1 tablet (150 mg total) by mouth daily. 90 tablet 0  . cetirizine (ZYRTEC) 5 MG tablet Take by mouth.    . Cholecalciferol (VITAMIN D3) 5000 units CAPS Take 1 capsule by mouth daily.    . clonazePAM (KLONOPIN) 0.5 MG tablet Take 1 tab two times a day & 2 tabs at bedtime as needed. 100 tablet 0  . fluticasone (FLONASE)  50 MCG/ACT nasal spray 1 spray by Each Nare route daily.    [name] lamoTRIgine (LAMICTAL) 200 MG tablet Take 200 mg by mouth daily.    [name] liothyronine (CYTOMEL) 25 MCG tablet Take 2 tablets (50 mcg total) by mouth every morning. 180 tablet 0  . lithium carbonate (LITHOBID) 300 MG  CR tablet Take 2 tablets (600 mg total) by mouth 2 (two) times daily. 360 tablet 1  . loxapine (LOXITANE) 50 MG capsule Take 300 mg by mouth at bedtime.    . Melatonin 10 MG CAPS Take 10 mg by mouth.    . Multiple Vitamins-Minerals (MULTIVITAMIN ADULT) CHEW Chew by mouth.    . Omega-3 Fatty Acids (FISH OIL) 1200 MG CAPS Take by mouth.    . QUEtiapine (SEROQUEL) 25 MG tablet TAKE 1 TO 2 TABLETS BY MOUTH TWICE A DAY AS NEEDED ANXIETY (Patient taking differently: Take 50 mg by mouth daily. TAKE 1 TO 2 TABLETS BY MOUTH TWICE A DAY AS NEEDED ANXIETY) 360 tablet 0  . sertraline (ZOLOFT) 100 MG tablet TAKE 2 TABLETS BY MOUTH EVERY DAY 180 tablet 1  . traZODone (DESYREL) 100 MG tablet Take 100 mg by mouth at bedtime.     No current facility-administered medications for this visit.     Medication Side Effects: Other: tremor, grinding teeth daytime, blurred vision  Allergies:  Allergies  Allergen Reactions  . Metformin And Related   . Naltrexone     History reviewed. No pertinent past medical history.  History reviewed. No pertinent family history.  Social History   Socioeconomic History  . Marital status: Legally Separated    Spouse name: Not on file  . Number of children: Not on file  . Years of education: Not on file  . Highest education level: Not on file  Occupational History  . Not on file  Social Needs  . Financial resource strain: Not on file  . Food insecurity:    Worry: Not on file    Inability: Not on file  . Transportation needs:    Medical: Not on file    Non-medical: Not on file  Tobacco Use  . Smoking status: Never Smoker  . Smokeless tobacco: Never Used  Substance and Sexual Activity  . Alcohol use: Not on file  . Drug use: Not on file  . Sexual activity: Not on file  Lifestyle  . Physical activity:    Days per week: Not on file    Minutes per session: Not on file  . Stress: Not on file  Relationships  . Social connections:    Talks on phone: Not on file     Gets together: Not on file    Attends religious service: Not on file    Active member of club or organization: Not on file    Attends meetings of clubs or organizations: Not on file    Relationship status: Not on file  . Intimate partner violence:    Fear of current or ex partner: Not on file    Emotionally abused: Not on file    Physically abused: Not on file    Forced sexual activity: Not on file  Other Topics Concern  . Not on file  Social History Narrative  . Not on file    Past Medical History, Surgical history, Social history, and Family history were reviewed and updated as appropriate.   Please see review of systems for further details on the patient's review from today.   Objective:  Physical Exam:  There were no vitals taken for this visit.  Physical Exam Neurological:     Mental Status: She is alert and oriented to person, place, and time.     Cranial Nerves: No dysarthria.  Psychiatric:        Attention and Perception: Attention normal. She perceives auditory hallucinations.        Speech: Speech normal.        Behavior: Behavior is cooperative.        Thought Content: Thought content normal. Thought content is not paranoid or delusional. Thought content does not include homicidal or suicidal ideation. Thought content does not include homicidal or suicidal plan.        Cognition and Memory: Cognition and memory normal.        Judgment: Judgment normal.     Comments: Less depression and anxiety.     Lab Review:     Component Value Date/Time   NA 140 10/10/2010 1150   K 3.8 10/10/2010 1150   CL 108 10/10/2010 1150   CO2 25 10/10/2010 1150   GLUCOSE 99 10/10/2010 1150   BUN 6 10/10/2010 1150   CREATININE 0.72 10/10/2010 1150   CALCIUM 9.8 10/10/2010 1150   PROT 7.5 10/10/2010 0207   ALBUMIN 4.1 10/10/2010 0207   AST 23 10/10/2010 0207   ALT 19 10/10/2010 0207   ALKPHOS 40 10/10/2010 0207   BILITOT 0.4 10/10/2010 0207   GFRNONAA >60 10/10/2010 1150    GFRAA  10/10/2010 1150    >60        The eGFR has been calculated using the MDRD equation. This calculation has not been validated in all clinical situations. eGFR's persistently <60 mL/min signify possible Chronic Kidney Disease.       Component Value Date/Time   WBC 16.7 (H) 10/10/2010 0207   RBC 4.50 10/10/2010 0207   HGB 12.7 04/14/2011 0956   HCT 40.0 10/10/2010 0223   PLT 225 10/10/2010 0207   MCV 86.7 10/10/2010 0207   MCH 28.7 10/10/2010 0207   MCHC 33.1 10/10/2010 0207   RDW 12.9 10/10/2010 0207   LYMPHSABS 3.3 10/10/2010 0207   MONOABS 1.7 (H) 10/10/2010 0207   EOSABS 0.4 10/10/2010 0207   BASOSABS 0.1 10/10/2010 0207    Lithium Lvl  Date Value Ref Range Status  06/29/2018 1.3 (H) 0.6 - 1.2 mmol/L Final    Comment:                                     Detection Limit = 0.1                           <0.1 indicates None Detected Patient drug level exceeds published reference range.  Evaluate clinically for signs of potential toxicity.      No results found for: PHENYTOIN, PHENOBARB, VALPROATE, CBMZ  Last lithium level on 06/29/2018 was 1.3 which is typical for her.  She needs high normal lithium level for stability.  Her vitamin D level was 70 which is good. .res Assessment: Plan:    Schizoaffective disorder, bipolar type ([place]) - Plan: Lithium level, TSH  Generalized anxiety disorder  Lithium-induced tremor - Plan: TSH  Low serum vitamin D  Syncope and collapse  Panic disorder with agoraphobia  Lithium use - Plan: TSH    Greater than 50% of face to face time with patient was  spent on counseling and coordination of care.  Lorri is chronically unstable and prone to extraparametal side effects complicating treatment.  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 Specifically the command hallucinations to kill her self have resolved.  Consider reducing sertraline to 150 next visit if still stable to reduce cycling risk.  Check lithium level and TSh  We reviewed options for treating depression with the hope of eventually eliminating ECT:  Never taken Vraylar, amantadine, pramipexole, Wellbutrin.  Disc her ? History of 1 possible seizure, but she now is saying it was probably a passing out episode which has been a chronic problem.  continue Wellbutrin to 150 .  Depression is better so far witht  The Wellbutrin.  Disc alternative TMS to ECT.  Just she talked to the ECT team in [facility] about this.  Extensive discussion of clozapine in detail.  Including the risk of severe neutropenia, marked weight gain, sedation, metabolic problems, cardiac risk, etc.  Discussed the need for weekly blood test for at least 6 months.  However we also discussed this is the most effective antipsychotic on the market.  It matter may better control her voices.  She wishes to defer because she is feeling better this visit than she was last visit.  They wish to max out the loxapine before trying to switch to clozapine.  The maximum recommended dosage was discussed.  The risk of increased EPS and other side effects was discussed.  It may be contributing to her blurred vision but she needs the medicine. Loxapine continue 300 mg daily.  Her low vitamin D level is supplemented and resolved with supplemental vitamin D.  Low Vitamin D increases the risk of depression  This appt was 30 mins.  Follow-up 6 weeks  I connected with patient by a video enabled telemedicine application WebEx, with their informed consent, and verified patient privacy and that I am speaking with the correct person using two identifiers.  I was located at office and patient at home.  Lynder Parents, MD, DFAPA  Please see After Visit Summary for patient specific  instructions.  No future appointments.  Orders Placed This Encounter  Procedures  . Lithium level  . TSH      -------------------------------

## 2018-12-02 ENCOUNTER — Other Ambulatory Visit: Payer: Self-pay | Admitting: Psychiatry

## 2018-12-14 ENCOUNTER — Other Ambulatory Visit: Payer: Self-pay | Admitting: Psychiatry

## 2018-12-15 ENCOUNTER — Other Ambulatory Visit: Payer: Self-pay

## 2018-12-15 MED ORDER — TRAZODONE HCL 100 MG PO TABS: 100.0000 mg | ORAL_TABLET | Freq: Every day | ORAL | 1 refills | Status: DC

## 2018-12-26 ENCOUNTER — Telehealth: Payer: Self-pay | Admitting: Psychiatry

## 2018-12-26 ENCOUNTER — Other Ambulatory Visit: Payer: Self-pay | Admitting: Psychiatry

## 2018-12-26 DIAGNOSIS — F250 Schizoaffective disorder, bipolar type: Secondary | ICD-10-CM

## 2018-12-26 DIAGNOSIS — Z79899 Other long term (current) drug therapy: Secondary | ICD-10-CM

## 2018-12-26 NOTE — Telephone Encounter (Signed)
Because it is been several months since she had laboratory tests I will add a serum BMP to check kidney function and calcium levels on the lithium.  She also has a vitamin D level scheduled and a TSH.  She does not need to fast.  But she does need to skip the morning lithium dosage until after the blood test on the day of the test.  Also preferably get the blood test in the morning before noon.

## 2018-12-26 NOTE — Telephone Encounter (Signed)
Laura Mathews called to make follow up appt.  Next avail was 03/01/19.  She is on the cxl, but says she is doing fine.  She also asked about getting her labs done.  She was unsure what the instructions are - does she take her meds, should she fast, etc.  Please call her to go over lab protocol.

## 2018-12-27 NOTE — Progress Notes (Signed)
Lithium level and BMP requested to be sent to [facility] instead of [facility] by patient.

## 2018-12-27 NOTE — Telephone Encounter (Signed)
Lithium level and BMP orders changed to [facility] instead of [facility] per patient request

## 2018-12-29 ENCOUNTER — Telehealth: Payer: Self-pay

## 2018-12-29 LAB — TSH: TSH: 0.074 u[IU]/mL — ABNORMAL LOW (ref 0.450–4.500)

## 2018-12-29 LAB — LITHIUM LEVEL: Lithium Lvl: 1.1 mmol/L (ref 0.6–1.2)

## 2018-12-30 NOTE — Telephone Encounter (Signed)
Pt. Made aware.

## 2019-01-29 ENCOUNTER — Other Ambulatory Visit: Payer: Self-pay | Admitting: Psychiatry

## 2019-02-23 ENCOUNTER — Telehealth: Payer: Self-pay | Admitting: Psychiatry

## 2019-02-23 ENCOUNTER — Other Ambulatory Visit: Payer: Self-pay

## 2019-02-23 MED ORDER — LOXAPINE SUCCINATE 50 MG PO CAPS: ORAL_CAPSULE | ORAL | 0 refills | Status: DC

## 2019-02-23 MED ORDER — SERTRALINE HCL 100 MG PO TABS: 200.0000 mg | ORAL_TABLET | Freq: Every day | ORAL | 1 refills | Status: DC

## 2019-02-23 NOTE — Telephone Encounter (Signed)
Pt called need refill on Zoloft 100 mg 90day and  Loxapine 50 mg 90day to [facility]

## 2019-02-23 NOTE — Telephone Encounter (Signed)
Refills submitted.  

## 2019-02-24 ENCOUNTER — Other Ambulatory Visit: Payer: Self-pay

## 2019-02-24 MED ORDER — LOXAPINE SUCCINATE 50 MG PO CAPS: ORAL_CAPSULE | ORAL | 0 refills | Status: DC

## 2019-02-27 ENCOUNTER — Other Ambulatory Visit: Payer: Self-pay | Admitting: Psychiatry

## 2019-03-01 ENCOUNTER — Encounter

## 2019-03-01 ENCOUNTER — Encounter: Payer: Self-pay | Admitting: Psychiatry

## 2019-03-01 ENCOUNTER — Other Ambulatory Visit: Payer: Self-pay

## 2019-03-01 ENCOUNTER — Ambulatory Visit (INDEPENDENT_AMBULATORY_CARE_PROVIDER_SITE_OTHER): Payer: Medicare Other | Admitting: Psychiatry

## 2019-03-01 DIAGNOSIS — F411 Generalized anxiety disorder: Secondary | ICD-10-CM

## 2019-03-01 DIAGNOSIS — F4001 Agoraphobia with panic disorder: Secondary | ICD-10-CM | POA: Diagnosis not present

## 2019-03-01 DIAGNOSIS — G251 Drug-induced tremor: Secondary | ICD-10-CM

## 2019-03-01 DIAGNOSIS — F250 Schizoaffective disorder, bipolar type: Secondary | ICD-10-CM

## 2019-03-01 DIAGNOSIS — Z79899 Other long term (current) drug therapy: Secondary | ICD-10-CM

## 2019-03-01 DIAGNOSIS — R7989 Other specified abnormal findings of blood chemistry: Secondary | ICD-10-CM

## 2019-03-01 NOTE — Progress Notes (Signed)
Laura Mathews [facility] [id number] 1981-01-29 38 y.o.   Virtual Visit via Telephone Note  I connected with pt by telephone and verified that I am speaking with the correct person using two identifiers.   I discussed the limitations, risks, security and privacy concerns of performing an evaluation and management service by telephone and the availability of in person appointments. I also discussed with the patient that there may be a patient responsible charge related to this service. The patient expressed understanding and agreed to proceed.  I discussed the assessment and treatment plan with the patient. The patient was provided an opportunity to ask questions and all were answered. The patient agreed with the plan and demonstrated an understanding of the instructions.   The patient was advised to call back or seek an in-person evaluation if the symptoms worsen or if the condition fails to improve as anticipated.  I provided 30 minutes of non-face-to-face time during this encounter. The call started at 4:00 and ended at 4:30 PM. The patient was located at home and the provider was located office.   Subjective:   Patient ID:  Laura Mathews is a 38 y.o. (DOB [birth year]) female.  Chief Complaint:  Chief Complaint  Patient presents with  . Follow-up    Medication Management  . Anxiety    Medication Management    Anxiety Symptoms include decreased concentration. Patient reports no confusion, nervous/anxious behavior or suicidal ideas.      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  Patient does not possess technology to allow video conferencing  At visit September 19, 2018.  At that visit we reduced Wellbutrin XL 300 to 150 because of anxiety and recently high blood pressure.  At her last visit in April 2020.  She reported no problems from reducing the Wellbutrin and that her anxiety was somewhat better.   There were no changes made at the visit in April.  Really well overall with ECT reduced to once monthly.  Mood a little down but not depressed.  Started running a little daily and that's good.  Wishes she were happier. About to go to the beach with her family.  Twin sister about to have baby boy due in a week.  [name] is doing well.  Applying for better job.  Some are in other states.  Not worrying about it.  Together for 13 years.    Still hears noise most of the time but not hearing clear words.  Doesn't mind it much.  No problems with access to loxapine.  Patient reports stable mood and denies depressed or irritable moods.  Patient reduced recent difficulty with anxiety.  Walking more has helped and less depression improved motivation. Patient denies difficulty with sleep initiation or maintenance. Denies appetite disturbance.  Patient reports that energy and motivation have been good.  Patient denies any difficulty with concentration.  Patient denies any suicidal ideation.  No recent panic.   Quiet noise.  No further syncope.  Thinks the depression may be helped by the Wellbutrin. Asks about reducing the sertraline in goal of less meds.  ECT to monthly.  Helped memory.  Asks about stretching it further.  However when she went 5-week intervals this last month she noticed more depression during the week preceding the ECT  Psychiatric medication history includes risperidone 3.5 mg EPS, Abilify 30 mg withdrawal dyskinesia, perphenazine, Geodon, olanzapine, Saphris 5 mg, Haldol, Latuda 160, loxapine 300 milligrams, Seroquel,  lamotrigine, Depakote,, lithium,  clonazepam,  Fetzima, sertraline, Pristiq,, paroxetine,  citalopram, Lexapro, buspirone, Cytomel, topiramate, buspirone,, gabapentin, trazodone propranolol with no response for anxiety ECT maintenance Under our psychiatric care since 2006.  Remote history of OD Klonopin.  Review of Systems:  Review of Systems  Constitutional: Positive for  fatigue.  Cardiovascular:       BP higher 120-130/95.  Neurological: Positive for tremors. Negative for weakness and headaches.  Psychiatric/Behavioral: Positive for decreased concentration. Negative for agitation, behavioral problems, confusion, dysphoric mood, hallucinations, self-injury, sleep disturbance and suicidal ideas. The patient is not nervous/anxious and is not hyperactive.     Medications: I have reviewed the patient's current medications.  Current Outpatient Medications  Medication Sig Dispense Refill  . b complex vitamins tablet Take 1 tablet by mouth daily.    [name] buPROPion (WELLBUTRIN XL) 150 MG 24 hr tablet TAKE 1 TABLET BY MOUTH EVERY DAY 90 tablet 0  . Cholecalciferol (VITAMIN D3) 5000 units CAPS Take 1 capsule by mouth daily.    . clonazePAM (KLONOPIN) 0.5 MG tablet Take 1 tab two times a day & 2 tabs at bedtime as needed. 100 tablet 0  . fluticasone (FLONASE) 50 MCG/ACT nasal spray 1 spray by Each Nare route daily.    [name] lamoTRIgine (LAMICTAL) 200 MG tablet Take 200 mg by mouth daily.    [name] liothyronine (CYTOMEL) 25 MCG tablet Take 2 tablets (50 mcg total) by mouth every morning. 180 tablet 0  . lithium carbonate (LITHOBID) 300 MG CR tablet TAKE 2 TABLETS (600MG) TWO TIMES A DAY 360 tablet 1  . loxapine (LOXITANE) 50 MG capsule TAKE 6 CAPSULES (300 MG TOTAL) BY MOUTH AT BEDTIME. 450 capsule 0  . Melatonin 10 MG CAPS Take 10 mg by mouth.    . Multiple Vitamins-Minerals (MULTIVITAMIN ADULT) CHEW Chew by mouth.    . Omega-3 Fatty Acids (FISH OIL) 1200 MG CAPS Take by mouth.    . QUEtiapine (SEROQUEL) 25 MG tablet TAKE 1 TO 2 TABLETS BY MOUTH TWICE A DAY AS NEEDED ANXIETY 360 tablet 0  . sertraline (ZOLOFT) 100 MG tablet Take 2 tablets (200 mg total) by mouth daily. 180 tablet 1  . traZODone (DESYREL) 100 MG tablet Take 1 tablet (100 mg total) by mouth at bedtime. 90 tablet 1   No current facility-administered medications for this visit.     Medication Side Effects: Other:  tremor, grinding teeth daytime, blurred vision, mild right arm posturing  Allergies:  Allergies  Allergen Reactions  . Metformin And Related   . Naltrexone     History reviewed. No pertinent past medical history.  History reviewed. No pertinent family history.  Social History   Socioeconomic History  . Marital status: Legally Separated    Spouse name: Not on file  . Number of children: Not on file  . Years of education: Not on file  . Highest education level: Not on file  Occupational History  . Not on file  Social Needs  . Financial resource strain: Not on file  . Food insecurity    Worry: Not on file    Inability: Not on file  . Transportation needs    Medical: Not on file    Non-medical: Not on file  Tobacco Use  . Smoking status: Never Smoker  . Smokeless tobacco: Never Used  Substance and Sexual Activity  . Alcohol use: Not on file  . Drug use: Not on file  . Sexual activity: Not on file  Lifestyle  . Physical activity  Days per week: Not on file    Minutes per session: Not on file  . Stress: Not on file  Relationships  . Social Herbalist on phone: Not on file    Gets together: Not on file    Attends religious service: Not on file    Active member of club or organization: Not on file    Attends meetings of clubs or organizations: Not on file    Relationship status: Not on file  . Intimate partner violence    Fear of current or ex partner: Not on file    Emotionally abused: Not on file    Physically abused: Not on file    Forced sexual activity: Not on file  Other Topics Concern  . Not on file  Social History Narrative  . Not on file    Past Medical History, Surgical history, Social history, and Family history were reviewed and updated as appropriate.   Please see review of systems for further details on the patient's review from today.   Objective:   Physical Exam:  There were no vitals taken for this visit.  Physical  Exam Neurological:     Mental Status: She is alert and oriented to person, place, and time.     Cranial Nerves: No dysarthria.  Psychiatric:        Attention and Perception: Attention normal. She perceives auditory hallucinations.        Speech: Speech normal.        Behavior: Behavior is cooperative.        Thought Content: Thought content normal. Thought content is not paranoid or delusional. Thought content does not include homicidal or suicidal ideation. Thought content does not include homicidal or suicidal plan.        Cognition and Memory: Cognition and memory normal.        Judgment: Judgment normal.     Comments: Less depression and anxiety.     Lab Review:     Component Value Date/Time   NA 140 10/10/2010 1150   K 3.8 10/10/2010 1150   CL 108 10/10/2010 1150   CO2 25 10/10/2010 1150   GLUCOSE 99 10/10/2010 1150   BUN 6 10/10/2010 1150   CREATININE 0.72 10/10/2010 1150   CALCIUM 9.8 10/10/2010 1150   PROT 7.5 10/10/2010 0207   ALBUMIN 4.1 10/10/2010 0207   AST 23 10/10/2010 0207   ALT 19 10/10/2010 0207   ALKPHOS 40 10/10/2010 0207   BILITOT 0.4 10/10/2010 0207   GFRNONAA >60 10/10/2010 1150   GFRAA  10/10/2010 1150    >60        The eGFR has been calculated using the MDRD equation. This calculation has not been validated in all clinical situations. eGFR's persistently <60 mL/min signify possible Chronic Kidney Disease.       Component Value Date/Time   WBC 16.7 (H) 10/10/2010 0207   RBC 4.50 10/10/2010 0207   HGB 12.7 04/14/2011 0956   HCT 40.0 10/10/2010 0223   PLT 225 10/10/2010 0207   MCV 86.7 10/10/2010 0207   MCH 28.7 10/10/2010 0207   MCHC 33.1 10/10/2010 0207   RDW 12.9 10/10/2010 0207   LYMPHSABS 3.3 10/10/2010 0207   MONOABS 1.7 (H) 10/10/2010 0207   EOSABS 0.4 10/10/2010 0207   BASOSABS 0.1 10/10/2010 0207    Lithium Lvl  Date Value Ref Range Status  12/27/2018 1.1 0.6 - 1.2 mmol/L Final    Comment:  Detection Limit = 0.1                           <0.1 indicates None Detected     TSH low on Cytomel in June 2020 No results found for: PHENYTOIN, PHENOBARB, VALPROATE, CBMZ  Last lithium level on 06/29/2018 was 1.3 which is typical for her.  She needs high normal lithium level for stability.  Her vitamin D level was 70 which is good. .res Assessment: Plan:    Dany was seen today for follow-up and anxiety.  Diagnoses and all orders for this visit:  Schizoaffective disorder, bipolar type ([place])  Generalized anxiety disorder  Panic disorder with agoraphobia  Lithium-induced tremor  Low serum vitamin D  Lithium use  Greater than 50% of face to face time with patient was spent on counseling and coordination of care.  Favour is chronically unstable and prone to extraparametal side effects complicating treatment.  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 Specifically the command hallucinations to kill her self have resolved.  Consider reducing sertraline to 150 next visit if still stable to reduce cycling risk.  Check lithium level and TSh.  Disc suppressed TSH.  Suggest trial of reduction in Cytomel to 1 1/2 of 25 mcg tablets each AM.  She's not having palpitations nor anxiety.  Explained this in depth.   We reviewed options for treating depression with the hope of eventually eliminating ECT:  Never taken Vraylar, amantadine, pramipexole, Wellbutrin.  Disc her ? History of 1 possible seizure, but she now is saying it was probably a passing out episode which has been a chronic problem.  continue Wellbutrin to 150 .  Depression is better so far witht  The Wellbutrin.  Disc alternative TMS to ECT.  Just she talked to the ECT team in [facility] about  this. Continue ECT monthly for a few more months.  Discussed the risks of stretching it out or stopping ECT in detail.  Extensive discussion of clozapine in detail.  Including the risk of severe neutropenia, marked weight gain, sedation, metabolic problems, cardiac risk, etc.  Discussed the need for weekly blood test for at least 6 months.  However we also discussed this is the most effective antipsychotic on the market.  It matter may better control her voices.  She wishes to defer because she is feeling better this visit than she was last visit.  They wish to max out the loxapine before trying to switch to clozapine.  The maximum recommended dosage was discussed.  The risk of increased EPS and other side effects was discussed.  It may be contributing to her blurred vision but she needs the medicine.  Disc option of reducing the it.  I advised against reducing the loxapine even though she is having some mild EPS and may be the vision issues are related.  However the EPS is 5 very minor. Rec try reading glasses. Loxapine continue 300 mg daily.  Her low vitamin D level is supplemented and resolved with supplemental vitamin D.  Low Vitamin D increases the risk of depression  This appt was 30 mins.  Follow-up 8 weeks  Lynder Parents, MD, DFAPA  Please see After Visit Summary for patient specific instructions.  No future appointments.  No orders of the defined types were placed  in this encounter.     -------------------------------

## 2019-03-14 ENCOUNTER — Other Ambulatory Visit: Payer: Self-pay

## 2019-03-14 MED ORDER — CLONAZEPAM 0.5 MG PO TABS: ORAL_TABLET | ORAL | 0 refills | Status: DC

## 2019-03-29 ENCOUNTER — Telehealth: Payer: Self-pay | Admitting: Psychiatry

## 2019-03-29 ENCOUNTER — Other Ambulatory Visit: Payer: Self-pay

## 2019-03-29 MED ORDER — LIOTHYRONINE SODIUM 25 MCG PO TABS: 50.0000 ug | ORAL_TABLET | ORAL | 0 refills | Status: DC

## 2019-03-29 NOTE — Telephone Encounter (Signed)
Patient need refill on Cytomel to be sent to [facility], patient also wanted to know when her last kidney function test was, and what the results were.

## 2019-03-29 NOTE — Telephone Encounter (Signed)
It looks like she still has active orders at [facility] for lithium level and BMP.  Is been 3 months since her last lithium level is appropriate to get both of those before her next appointment with me in October.

## 2019-03-30 NOTE — Telephone Encounter (Signed)
Left her a VM to return my call.

## 2019-03-30 NOTE — Telephone Encounter (Signed)
Pt. Returned call and made aware. She will get labs drawn before next appt.

## 2019-04-26 ENCOUNTER — Ambulatory Visit (INDEPENDENT_AMBULATORY_CARE_PROVIDER_SITE_OTHER): Payer: Medicare Other | Admitting: Psychiatry

## 2019-04-26 ENCOUNTER — Encounter: Payer: Self-pay | Admitting: Psychiatry

## 2019-04-26 ENCOUNTER — Other Ambulatory Visit: Payer: Self-pay

## 2019-04-26 ENCOUNTER — Other Ambulatory Visit: Payer: Self-pay | Admitting: Psychiatry

## 2019-04-26 DIAGNOSIS — F250 Schizoaffective disorder, bipolar type: Secondary | ICD-10-CM

## 2019-04-26 DIAGNOSIS — F411 Generalized anxiety disorder: Secondary | ICD-10-CM | POA: Diagnosis not present

## 2019-04-26 DIAGNOSIS — F4001 Agoraphobia with panic disorder: Secondary | ICD-10-CM

## 2019-04-26 DIAGNOSIS — G251 Drug-induced tremor: Secondary | ICD-10-CM

## 2019-04-26 DIAGNOSIS — Z79899 Other long term (current) drug therapy: Secondary | ICD-10-CM

## 2019-04-26 DIAGNOSIS — R7989 Other specified abnormal findings of blood chemistry: Secondary | ICD-10-CM

## 2019-04-26 NOTE — Progress Notes (Signed)
Laura Mathews [facility] [id number] 1980-10-16 38 y.o.   Virtual Visit via [place]  I connected with pt by [facility]  and verified that I am speaking with the correct person using two identifiers.   I discussed the limitations, risks, security and privacy concerns of performing an evaluation and management service by [facility] and the availability of in person appointments. I also discussed with the patient that there may be a patient responsible charge related to this service. The patient expressed understanding and agreed to proceed.  I discussed the assessment and treatment plan with the patient. The patient was provided an opportunity to ask questions and all were answered. The patient agreed with the plan and demonstrated an understanding of the instructions.   The patient was advised to call back or seek an in-person evaluation if the symptoms worsen or if the condition fails to improve as anticipated.  I provided 30 minutes of non-face-to-face time during this encounter. The call started at 1030  and ended at 1100 PM. The patient was located at home and the provider was located office.   Subjective:   Patient ID:  Laura Mathews is a 38 y.o. (DOB 10-Dec-1980) female.  Chief Complaint:  Chief Complaint  Patient presents with  . Follow-up    med management  . Hallucinations  . Anxiety  . Depression    Anxiety Symptoms include decreased concentration. Patient reports no confusion, nervous/anxious behavior or suicidal ideas.      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  Last seen August 2020.  We reduced the Cytomel to 37.5 mcg bc of suppressed TSH.  She's noticed no problems.  At visit September 19, 2018.  At that visit we reduced Wellbutrin XL 300 to 150 because of anxiety and recently high blood pressure.  At her last visit in April 2020.  She reported no problems from reducing the Wellbutrin and that  her anxiety was somewhat better.  There were no changes made at the visit in April.  Has not had problems so far getting the loxapine.  Really well overall with ECT reduced to once monthly. Reports getting "modified bilateral"  ECT.   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 Twin sister about to have baby boy due in a week.  Laura Mathews is doing well.  Applying for better job.  Some are in other states.  Not worrying about it.  Together for 13 years.    Still hears noise most of the time if she pays attention but not hearing clear words.  Doesn't mind it much.  Only worse if gets really stressed or depressed.  Patient reports stable mood and denies depressed or irritable moods.  Patient reduced recent difficulty with anxiety and she's pleased..  Occ bouts of anxiety daytime and needs Seroquel 75 mg when it occurs.  Walking more has helped and less depression improved motivation. Patient denies difficulty with sleep initiation or maintenance. Denies appetite disturbance.  Patient reports that energy and motivation have been good.  Patient denies any difficulty with concentration.  Patient denies any suicidal ideation.  No recent panic.   Quiet noise.  No further syncope.  Thinks the depression may be helped by the Wellbutrin. Asks about reducing the sertraline in goal of less meds.  ECT to monthly.  Helped memory.  Asks about stretching it further.  However when she went 5-week intervals this last  month she noticed more depression during the week preceding the ECT  Psychiatric medication history includes risperidone 3.5 mg EPS, Abilify 30 mg withdrawal dyskinesia, perphenazine, Geodon, olanzapine, Saphris 5 mg, Haldol, Latuda 160, loxapine 300 mg, Seroquel,  lamotrigine, Depakote,, lithium, clonazepam,  Fetzima, sertraline, Pristiq,, paroxetine,  citalopram, Lexapro, buspirone, Cytomel, topiramate, buspirone,, gabapentin, trazodone propranolol with no  response for anxiety ECT maintenance Under our psychiatric care since 2006.  Remote history of OD Klonopin.  Review of Systems:  Review of Systems  Constitutional: Negative for fatigue.  Genitourinary: Positive for urgency.  Neurological: Positive for tremors. Negative for weakness and headaches.  Psychiatric/Behavioral: Positive for decreased concentration. Negative for agitation, behavioral problems, confusion, dysphoric mood, hallucinations, self-injury, sleep disturbance and suicidal ideas. The patient is not nervous/anxious and is not hyperactive.     Medications: I have reviewed the patient's current medications.  Current Outpatient Medications  Medication Sig Dispense Refill  . b complex vitamins tablet Take 1 tablet by mouth daily.    [name] buPROPion (WELLBUTRIN XL) 150 MG 24 hr tablet TAKE 1 TABLET BY MOUTH EVERY DAY 90 tablet 0  . Cholecalciferol (VITAMIN D3) 5000 units CAPS Take 1 capsule by mouth daily.    . clonazePAM (KLONOPIN) 0.5 MG tablet Take 1 tab two times a day & 2 tabs at bedtime as needed. 100 tablet 0  . fluticasone (FLONASE) 50 MCG/ACT nasal spray 1 spray by Each Nare route daily.    [name] lamoTRIgine (LAMICTAL) 200 MG tablet Take 200 mg by mouth daily.    [name] liothyronine (CYTOMEL) 25 MCG tablet Take 2 tablets (50 mcg total) by mouth every morning. (Patient taking differently: Take 37.5 mcg by mouth every morning. ) 180 tablet 0  . lithium carbonate (LITHOBID) 300 MG CR tablet TAKE 2 TABLETS (600MG) TWO TIMES A DAY 360 tablet 1  . loxapine (LOXITANE) 50 MG capsule TAKE 6 CAPSULES (300 MG TOTAL) BY MOUTH AT BEDTIME. 450 capsule 0  . Melatonin 10 MG CAPS Take 10 mg by mouth.    . Multiple Vitamins-Minerals (MULTIVITAMIN ADULT) CHEW Chew by mouth.    . Omega-3 Fatty Acids (FISH OIL) 1200 MG CAPS Take by mouth.    . QUEtiapine (SEROQUEL) 25 MG tablet TAKE 1 TO 2 TABLETS BY MOUTH TWICE A DAY AS NEEDED ANXIETY 360 tablet 0  . sertraline (ZOLOFT) 100 MG tablet Take 2 tablets  (200 mg total) by mouth daily. 180 tablet 1  . traZODone (DESYREL) 100 MG tablet Take 1 tablet (100 mg total) by mouth at bedtime. 90 tablet 1   No current facility-administered medications for this visit.     Medication Side Effects: Other: tremor, grinding teeth daytime, blurred vision, mild right arm posturing, sleep 11 hours.  Not much daytime sleepiness.  Allergies:  Allergies  Allergen Reactions  . Metformin And Related   . Naltrexone     History reviewed. No pertinent past medical history.  History reviewed. No pertinent family history.  Social History   Socioeconomic History  . Marital status: Legally Separated    Spouse name: Not on file  . Number of children: Not on file  . Years of education: Not on file  . Highest education level: Not on file  Occupational History  . Not on file  Social Needs  . Financial resource strain: Not on file  . Food insecurity    Worry: Not on file    Inability: Not on file  . Transportation needs    Medical: Not on file  Non-medical: Not on file  Tobacco Use  . Smoking status: Never Smoker  . Smokeless tobacco: Never Used  Substance and Sexual Activity  . Alcohol use: Not on file  . Drug use: Not on file  . Sexual activity: Not on file  Lifestyle  . Physical activity    Days per week: Not on file    Minutes per session: Not on file  . Stress: Not on file  Relationships  . Social Herbalist on phone: Not on file    Gets together: Not on file    Attends religious service: Not on file    Active member of club or organization: Not on file    Attends meetings of clubs or organizations: Not on file    Relationship status: Not on file  . Intimate partner violence    Fear of current or ex partner: Not on file    Emotionally abused: Not on file    Physically abused: Not on file    Forced sexual activity: Not on file  Other Topics Concern  . Not on file  Social History Narrative  . Not on file    Past Medical  History, Surgical history, Social history, and Family history were reviewed and updated as appropriate.   Please see review of systems for further details on the patient's review from today.   Objective:   Physical Exam:  There were no vitals taken for this visit.  Physical Exam Constitutional:      General: She is not in acute distress.    Appearance: She is well-developed.  Neurological:     Mental Status: She is alert and oriented to person, place, and time.     Cranial Nerves: No dysarthria.  Psychiatric:        Attention and Perception: Attention normal. She perceives auditory hallucinations. She does not perceive visual hallucinations.        Mood and Affect: Mood is anxious. Mood is not depressed. Affect is not labile, blunt, angry or inappropriate.        Speech: Speech normal.        Behavior: Behavior normal. Behavior is cooperative.        Thought Content: Thought content normal. Thought content is not paranoid or delusional. Thought content does not include homicidal or suicidal ideation. Thought content does not include homicidal or suicidal plan.        Cognition and Memory: Cognition and memory normal.        Judgment: Judgment normal.     Comments: Less depression and anxiety but residual.     Lab Review:     Component Value Date/Time   NA 140 10/10/2010 1150   K 3.8 10/10/2010 1150   CL 108 10/10/2010 1150   CO2 25 10/10/2010 1150   GLUCOSE 99 10/10/2010 1150   BUN 6 10/10/2010 1150   CREATININE 0.72 10/10/2010 1150   CALCIUM 9.8 10/10/2010 1150   PROT 7.5 10/10/2010 0207   ALBUMIN 4.1 10/10/2010 0207   AST 23 10/10/2010 0207   ALT 19 10/10/2010 0207   ALKPHOS 40 10/10/2010 0207   BILITOT 0.4 10/10/2010 0207   GFRNONAA >60 10/10/2010 1150   GFRAA  10/10/2010 1150    >60        The eGFR has been calculated using the MDRD equation. This calculation has not been validated in all clinical situations. eGFR's persistently <60 mL/min signify possible  Chronic Kidney Disease.       Component  Value Date/Time   WBC 16.7 (H) 10/10/2010 0207   RBC 4.50 10/10/2010 0207   HGB 12.7 04/14/2011 0956   HCT 40.0 10/10/2010 0223   PLT 225 10/10/2010 0207   MCV 86.7 10/10/2010 0207   MCH 28.7 10/10/2010 0207   MCHC 33.1 10/10/2010 0207   RDW 12.9 10/10/2010 0207   LYMPHSABS 3.3 10/10/2010 0207   MONOABS 1.7 (H) 10/10/2010 0207   EOSABS 0.4 10/10/2010 0207   BASOSABS 0.1 10/10/2010 0207    Lithium Lvl  Date Value Ref Range Status  12/27/2018 1.1 0.6 - 1.2 mmol/L Final    Comment:                                     Detection Limit = 0.1                           <0.1 indicates None Detected     TSH low on Cytomel in June 2020 No results found for: PHENYTOIN, PHENOBARB, VALPROATE, CBMZ  Last lithium level on 06/29/2018 was 1.3 which is typical for her.  She needs high normal lithium level for stability.  Her vitamin D level was 70 which is good. .res Assessment: Plan:    Kaedance was seen today for follow-up, hallucinations, anxiety and depression.  Diagnoses and all orders for this visit:  Schizoaffective disorder, bipolar type ([place])  Generalized anxiety disorder  Panic disorder with agoraphobia  Lithium-induced tremor  Low serum vitamin D  Lithium use  Greater than 50% of face to face time with patient was spent on counseling and coordination of care.  Janiylah is chronically unstable and prone to extrapyramidal side effects complicating treatment.  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 Specifically the command hallucinations to kill her self have resolved.  Consider reducing sertraline to 150 next visit if still stable to reduce cycling risk.  She does not wish to do this at this time.  Continue  Cytomel to 1 1/2 of 25 mcg tablets each AM.  She's not having palpitations nor anxiety.  Explained this in depth.   We reviewed options for treating depression with the hope of eventually eliminating ECT:  Never taken Vraylar, amantadine, pramipexole.  Disc her ? History of 1 possible seizure, but she now is saying it was probably a passing out episode which has been a chronic problem.   She does not wish to stop ECT at this time because she is improved in regard to her mood and she tends to have more seasonal depression in the winter which is approaching.  continue Wellbutrin to 150 .  Depression is better so far witht  The Wellbutrin.  Disc alternative TMS to ECT.  Just she talked to the ECT team in [facility] about this. Continue ECT monthly for a few more months at least until spring.  Extensive discussion of clozapine in detail.  We may be forced to switch to this if we cannot obtain loxapine given the shortage.  Including the risk of severe neutropenia, marked weight gain, sedation, metabolic problems, cardiac risk, etc.  Discussed the need for weekly blood test for at least 6 months.  However we also discussed this is the most effective antipsychotic on  the market.  It matter may better control her voices.  She wishes to defer because she is feeling better.  They wish to max out the loxapine before trying to switch to clozapine.  The maximum recommended dosage was discussed.  The risk of increased EPS and other side effects was discussed.  It may be contributing to her blurred vision but she needs the medicine.  Disc option of reducing the it.  I advised against reducing the loxapine even though she is having some mild EPS and may be the vision issues are related.  However the EPS is 5 very minor. Rec try reading glasses. Loxapine continue 300 mg daily. Hopefully she will not be affected by the shortage.  If we are forced to change meds, she may require hospitalization.  She doesn't want change  either.  Pleased with the results.  Her low vitamin D level is supplemented and resolved with supplemental vitamin D.  Low Vitamin D increases the risk of depression  Lithium level 2 weeks ago with PCP and TSH and was told that both were normal.  This appt was 30 mins.  Follow-up 8 weeks  Lynder Parents, MD, DFAPA  Please see After Visit Summary for patient specific instructions.  No future appointments.  No orders of the defined types were placed in this encounter.     -------------------------------

## 2019-04-27 ENCOUNTER — Other Ambulatory Visit: Payer: Self-pay | Admitting: Psychiatry

## 2019-05-01 ENCOUNTER — Telehealth: Payer: Self-pay | Admitting: Psychiatry

## 2019-05-01 ENCOUNTER — Other Ambulatory Visit: Payer: Self-pay

## 2019-05-01 MED ORDER — LOXAPINE SUCCINATE 50 MG PO CAPS: ORAL_CAPSULE | ORAL | 1 refills | Status: DC

## 2019-05-01 NOTE — Telephone Encounter (Signed)
[facility] has already contacted Dr. Clovis Pu about this.

## 2019-05-01 NOTE — Telephone Encounter (Signed)
Bonny called and requested that the prescription be sent to the [facility] [address]. Since the mail order can't get it.

## 2019-05-01 NOTE — Telephone Encounter (Signed)
Pharmacy called to say that the loxapine in all strengths are on back order and are unable to get to get this medication. Maybe you can get this medication locally but wanted you to be notified.

## 2019-05-01 NOTE — Telephone Encounter (Signed)
Yes - thank you

## 2019-05-01 NOTE — Telephone Encounter (Signed)
Rx submiited

## 2019-05-03 ENCOUNTER — Other Ambulatory Visit: Payer: Self-pay | Admitting: Psychiatry

## 2019-05-03 NOTE — Telephone Encounter (Signed)
Keep directions the same for her cytomel or change to 1.5 tablets?

## 2019-05-03 NOTE — Telephone Encounter (Signed)
Any refills ?

## 2019-05-09 ENCOUNTER — Other Ambulatory Visit: Payer: Self-pay

## 2019-05-09 ENCOUNTER — Telehealth: Payer: Self-pay | Admitting: Psychiatry

## 2019-05-09 MED ORDER — TRAZODONE HCL 100 MG PO TABS: 100.0000 mg | ORAL_TABLET | Freq: Every day | ORAL | 1 refills | Status: DC

## 2019-05-09 NOTE — Telephone Encounter (Signed)
Pt called to request refill for Trazodone and Lithium for 3 mos @ [facility]

## 2019-05-09 NOTE — Telephone Encounter (Signed)
Refills submitted to [facility]

## 2019-05-25 ENCOUNTER — Other Ambulatory Visit: Payer: Self-pay

## 2019-05-25 ENCOUNTER — Encounter: Payer: Self-pay | Admitting: Psychiatry

## 2019-05-25 ENCOUNTER — Ambulatory Visit (INDEPENDENT_AMBULATORY_CARE_PROVIDER_SITE_OTHER): Payer: Medicare Other | Admitting: Psychiatry

## 2019-05-25 DIAGNOSIS — Z79899 Other long term (current) drug therapy: Secondary | ICD-10-CM

## 2019-05-25 DIAGNOSIS — F250 Schizoaffective disorder, bipolar type: Secondary | ICD-10-CM

## 2019-05-25 DIAGNOSIS — R7989 Other specified abnormal findings of blood chemistry: Secondary | ICD-10-CM

## 2019-05-25 DIAGNOSIS — F4001 Agoraphobia with panic disorder: Secondary | ICD-10-CM | POA: Diagnosis not present

## 2019-05-25 DIAGNOSIS — F411 Generalized anxiety disorder: Secondary | ICD-10-CM

## 2019-05-25 DIAGNOSIS — G251 Drug-induced tremor: Secondary | ICD-10-CM

## 2019-05-25 MED ORDER — OLANZAPINE 10 MG PO TBDP: 10.0000 mg | ORAL_TABLET | Freq: Every day | ORAL | 0 refills | Status: DC | PRN

## 2019-05-25 MED ORDER — LOXAPINE SUCCINATE 50 MG PO CAPS: ORAL_CAPSULE | ORAL | 1 refills | Status: DC

## 2019-05-25 NOTE — Progress Notes (Signed)
Laura Mathews [facility] [id number] March 12, 1981 38 y.o.   Virtual Visit via [place]  I connected with pt by [facility]  and verified that I am speaking with the correct person using two identifiers.   I discussed the limitations, risks, security and privacy concerns of performing an evaluation and management service by [facility] and the availability of in person appointments. I also discussed with the patient that there may be a patient responsible charge related to this service. The patient expressed understanding and agreed to proceed.  I discussed the assessment and treatment plan with the patient. The patient was provided an opportunity to ask questions and all were answered. The patient agreed with the plan and demonstrated an understanding of the instructions.   The patient was advised to call back or seek an in-person evaluation if the symptoms worsen or if the condition fails to improve as anticipated.  I provided 30 minutes of non-face-to-face time during this encounter. The call started at 130  and ended at 200 PM. The patient was located at home and the provider was located office.   Subjective:   Patient ID:  Laura Mathews is a 38 y.o. (DOB 12/15/1980) female.  Chief Complaint:  Chief Complaint  Patient presents with  . Follow-up    Medication Management  . Other    Schizoaffective disorder  . Anxiety  . Hallucinations    Anxiety Symptoms include decreased concentration. Patient reports no confusion, nervous/anxious behavior or suicidal ideas.      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  When seen August 2020.  We reduced the Cytomel to 37.5 mcg bc of suppressed TSH.  She's noticed no problems.  At visit September 19, 2018.  At that visit we reduced Wellbutrin XL 300 to 150 because of anxiety and recently high blood pressure.    At her visit in April 2020.  She reported no problems from  reducing the Wellbutrin and that her anxiety was somewhat better.  There were no changes made at the visit in April.  Last visit October 2020.  No meds were changed.  Have gotten [facility] not having [facility] to [facility] which is a significant risk for this patient.  She will try to access it.  Overall Loxapine has been very good to me but some movement disorder issues with toes moving, teeth grinding, Arm stiffness R, swallow involuntary movements.  Worries about Medicaid and the election and fear of losing the Medicaid.  Really well overall with ECT reduced to once monthly. Reports getting "modified bilateral"  ECT.   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 Twin sister about to have baby boy due in a week.  Laura Mathews is doing well.  Applying for better job.  Some are in other states.  Not worrying about it.  Together for 13 years.    Still hears noise most of the time if she pays attention but not hearing clear words.  Doesn't mind it much.  Only worse if gets really stressed or depressed.  [facility] recommended prn for psychosis bc anticipates holidays will trigger more voices with stress family, etc.  Patient reports stable mood and denies depressed or irritable moods.  Patient reduced recent difficulty with anxiety and she's pleased.  Occ bouts of anxiety daytime and needs Seroquel 75 mg when it occurs.  Walking more has helped and less depression improved motivation. Patient  denies difficulty with sleep initiation or maintenance. Denies appetite disturbance.  Patient reports that energy and motivation have been good.  Patient denies any difficulty with concentration.  Patient denies any suicidal ideation.  No recent panic.   Quiet noise.  No further near syncope for 7 mos.  Thinks the depression may be helped by the Wellbutrin. Asks about reducing the sertraline in goal of less meds.  ECT to monthly.  Helped memory.  Asks about stretching it  further.  However when she went 5-week intervals this last month she noticed more depression during the week preceding the ECT  Psychiatric medication history includes risperidone 3.5 mg EPS, Abilify 30 mg withdrawal dyskinesia, perphenazine, Geodon, olanzapine, Saphris 5 mg, Haldol, Latuda 160, loxapine 300 mg, Seroquel,   lamotrigine, Depakote,, lithium, clonazepam,  Fetzima, sertraline, Pristiq,, paroxetine,  citalopram, Lexapro, buspirone, Cytomel, topiramate, buspirone,, gabapentin, trazodone propranolol with no response for anxiety ECT maintenance Under our psychiatric care since 2006.  Remote history of OD Klonopin.  Review of Systems:  Review of Systems  Constitutional: Negative for fatigue.  Genitourinary: Positive for urgency.  Neurological: Positive for tremors. Negative for weakness and headaches.  Psychiatric/Behavioral: Positive for decreased concentration. Negative for agitation, behavioral problems, confusion, dysphoric mood, hallucinations, self-injury, sleep disturbance and suicidal ideas. The patient is not nervous/anxious and is not hyperactive.     Medications: I have reviewed the patient's current medications.  Current Outpatient Medications  Medication Sig Dispense Refill  . b complex vitamins tablet Take 1 tablet by mouth daily.    [name] buPROPion (WELLBUTRIN XL) 150 MG 24 hr tablet TAKE 1 TABLET BY MOUTH EVERY DAY 90 tablet 0  . Cholecalciferol (VITAMIN D3) 5000 units CAPS Take 1 capsule by mouth daily.    . clonazePAM (KLONOPIN) 0.5 MG tablet TAKE ONE TABLET BY MOUTH TWICE DAILY AND TWO TABLETS AT BEDTIME AS NEEDED 100 tablet 0  . fluticasone (FLONASE) 50 MCG/ACT nasal spray 1 spray by Each Nare route daily.    [name] lamoTRIgine (LAMICTAL) 200 MG tablet Take 200 mg by mouth daily.    [name] liothyronine (CYTOMEL) 25 MCG tablet Take 1.5 tablets (37.5 mcg total) by mouth every morning. 135 tablet 0  . lithium carbonate (LITHOBID) 300 MG CR tablet TAKE 2 TABLETS (600MG) TWO TIMES  A DAY 360 tablet 1  . loxapine (LOXITANE) 50 MG capsule TAKE 6 CAPSULES (300MG) AT BEDTIME 180 capsule 1  . Melatonin 10 MG CAPS Take 10 mg by mouth.    . Multiple Vitamins-Minerals (MULTIVITAMIN ADULT) CHEW Chew by mouth.    . Omega-3 Fatty Acids (FISH OIL) 1200 MG CAPS Take by mouth.    . QUEtiapine (SEROQUEL) 25 MG tablet TAKE 1 TO 2 TABLETS BY MOUTH TWICE A DAY AS NEEDED ANXIETY (Patient taking differently: TAKE 1 TO 3 TABLETS BY MOUTH TWICE A DAY AS NEEDED ANXIETY) 360 tablet 0  . sertraline (ZOLOFT) 100 MG tablet Take 2 tablets (200 mg total) by mouth daily. 180 tablet 1  . traZODone (DESYREL) 100 MG tablet Take 1 tablet (100 mg total) by mouth at bedtime. 90 tablet 1   No current facility-administered medications for this visit.     Medication Side Effects: Other: tremor, grinding teeth daytime, blurred vision, mild right arm posturing, sleep 11 hours.  Not much daytime sleepiness.  Allergies:  Allergies  Allergen Reactions  . Metformin And Related   . Naltrexone     History reviewed. No pertinent past medical history.  History reviewed. No pertinent family history.  Social History   Socioeconomic History  . Marital status: Legally Separated    Spouse name: Not on file  . Number of children: Not on file  . Years of education: Not on file  . Highest education level: Not on file  Occupational History  . Not on file  Social Needs  . Financial resource strain: Not on file  . Food insecurity    Worry: Not on file    Inability: Not on file  . Transportation needs    Medical: Not on file    Non-medical: Not on file  Tobacco Use  . Smoking status: Never Smoker  . Smokeless tobacco: Never Used  Substance and Sexual Activity  . Alcohol use: Not on file  . Drug use: Not on file  . Sexual activity: Not on file  Lifestyle  . Physical activity    Days per week: Not on file    Minutes per session: Not on file  . Stress: Not on file  Relationships  . Social Product manager on phone: Not on file    Gets together: Not on file    Attends religious service: Not on file    Active member of club or organization: Not on file    Attends meetings of clubs or organizations: Not on file    Relationship status: Not on file  . Intimate partner violence    Fear of current or ex partner: Not on file    Emotionally abused: Not on file    Physically abused: Not on file    Forced sexual activity: Not on file  Other Topics Concern  . Not on file  Social History Narrative  . Not on file    Past Medical History, Surgical history, Social history, and Family history were reviewed and updated as appropriate.   Please see review of systems for further details on the patient's review from today.   Objective:   Physical Exam:  There were no vitals taken for this visit.  Physical Exam Constitutional:      General: She is not in acute distress.    Appearance: She is well-developed.  Neurological:     Mental Status: She is alert and oriented to person, place, and time.     Cranial Nerves: No dysarthria.  Psychiatric:        Attention and Perception: Attention normal. She perceives auditory hallucinations. She does not perceive visual hallucinations.        Mood and Affect: Mood is anxious. Mood is not depressed. Affect is not labile, blunt, angry or inappropriate.        Speech: Speech normal.        Behavior: Behavior normal. Behavior is cooperative.        Thought Content: Thought content normal. Thought content is not paranoid or delusional. Thought content does not include homicidal or suicidal ideation. Thought content does not include homicidal or suicidal plan.        Cognition and Memory: Cognition and memory normal.        Judgment: Judgment normal.     Comments: Less depression and anxiety but residual. Always hear a little noise in my head but only worse if under a lot of stress.     Lab Review:     Component Value Date/Time   NA 140 10/10/2010  1150   K 3.8 10/10/2010 1150   CL 108 10/10/2010 1150   CO2 25 10/10/2010 1150   GLUCOSE 99 10/10/2010 1150  BUN 6 10/10/2010 1150   CREATININE 0.72 10/10/2010 1150   CALCIUM 9.8 10/10/2010 1150   PROT 7.5 10/10/2010 0207   ALBUMIN 4.1 10/10/2010 0207   AST 23 10/10/2010 0207   ALT 19 10/10/2010 0207   ALKPHOS 40 10/10/2010 0207   BILITOT 0.4 10/10/2010 0207   GFRNONAA >60 10/10/2010 1150   GFRAA  10/10/2010 1150    >60        The eGFR has been calculated using the MDRD equation. This calculation has not been validated in all clinical situations. eGFR's persistently <60 mL/min signify possible Chronic Kidney Disease.       Component Value Date/Time   WBC 16.7 (H) 10/10/2010 0207   RBC 4.50 10/10/2010 0207   HGB 12.7 04/14/2011 0956   HCT 40.0 10/10/2010 0223   PLT 225 10/10/2010 0207   MCV 86.7 10/10/2010 0207   MCH 28.7 10/10/2010 0207   MCHC 33.1 10/10/2010 0207   RDW 12.9 10/10/2010 0207   LYMPHSABS 3.3 10/10/2010 0207   MONOABS 1.7 (H) 10/10/2010 0207   EOSABS 0.4 10/10/2010 0207   BASOSABS 0.1 10/10/2010 0207    Lithium Lvl  Date Value Ref Range Status  12/27/2018 1.1 0.6 - 1.2 mmol/L Final    Comment:                                     Detection Limit = 0.1                           <0.1 indicates None Detected     TSH low on Cytomel in June 2020 No results found for: PHENYTOIN, PHENOBARB, VALPROATE, CBMZ  Last lithium level on 06/29/2018 was 1.3 which is typical for her.  She needs high normal lithium level for stability.  Her vitamin D level was 70 which is good. .res Assessment: Plan:    Adelise was seen today for follow-up, other, anxiety and hallucinations.  Diagnoses and all orders for this visit:  Schizoaffective disorder, bipolar type (HCC) -     loxapine (LOXITANE) 50 MG capsule; TAKE 6 CAPSULES (300MG) AT BEDTIME  Generalized anxiety disorder  Panic disorder with agoraphobia  Lithium-induced tremor  Low serum vitamin  D  Lithium use  Low vitamin D level  Greater than 50% of face to face time with patient was spent on counseling and coordination of care.  Lilas is chronically unstable and prone to extrapyramidal side effects complicating treatment.  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 Specifically the command hallucinations to kill her self have resolved.  Disc difference between sadness and depression.  She is not significantly depressed at this time.  Consider reducing sertraline to 150 next visit if still stable to reduce cycling risk.  She does not wish to do this at this time.  Continue Cytomel to 1 1/2 of 25 mcg tablets each AM.  She's not having palpitations nor anxiety.  Explained this in depth.   We reviewed options for treating depression with the hope of eventually eliminating ECT:  Never taken Vraylar, amantadine, pramipexole.  Disc her ? History of 1 possible seizure, but she now is saying it was probably a passing out episode which has been a chronic problem.  She does not wish to stop ECT at this time because she is improved in regard to her mood and she tends to have more seasonal depression in the winter which is approaching.  continue Wellbutrin to 150 .  Depression is better so far with  The Wellbutrin.  Disc alternative TMS to ECT.  Just she talked to the ECT team in [facility] about this. Continue ECT monthly for a few more months at least until spring.  Extensive discussion of clozapine in detail.  We may be forced to switch to this if we cannot obtain loxapine given the shortage.  Including the risk of severe neutropenia, marked weight gain, sedation, metabolic problems, cardiac risk, etc.  Discussed the need for weekly blood test for at least 6 months.   However we also discussed this is the most effective antipsychotic on the market.  It matter may better control her voices.  She wishes to defer because she is feeling better.  They wish to max out the loxapine before trying to switch to clozapine.  The maximum recommended dosage was discussed.  The risk of increased EPS and other side effects was discussed.  It may be contributing to her blurred vision but she needs the medicine.  Disc option of reducing the it.  I advised against reducing the loxapine even though she is having some mild EPS and may be the vision issues are related.  However the EPS is 5 very minor. Rec try reading glasses. Loxapine continue 300 mg daily.  Disc shortage.  Could be very detrimental if we have to change.  Will try [employer].  oPTION Fanapt.  She has had less orthostatic hypotension in the last year than she has had in previous years.  She may tolerate this better now and it is much easier to use than clozapine. Hopefully she will not be affected by the shortage.  If we are forced to change meds, she may require hospitalization.  She doesn't want change either.  Pleased with the results.  Her low vitamin D level is supplemented and resolved with supplemental vitamin D.  Low Vitamin D increases the risk of depression  Lithium level 2 weeks ago with PCP and TSH and was told that both were normal.  Per [facility] [place], prn for hallucinations bc expects more at the holidays and already on hiogh dose loxapine.  Need one with lower EPS.  Olanzapine SL 10 prn AH sent in.  This appt was 30 mins.  Follow-up 8 weeks  Lynder Parents, MD, DFAPA  Please see After Visit Summary for patient specific instructions.  No future appointments.  No orders of the defined types were placed in this encounter.     -------------------------------

## 2019-06-02 ENCOUNTER — Other Ambulatory Visit: Payer: Self-pay | Admitting: Psychiatry

## 2019-06-03 ENCOUNTER — Other Ambulatory Visit: Payer: Self-pay | Admitting: Psychiatry

## 2019-06-06 ENCOUNTER — Other Ambulatory Visit: Payer: Self-pay

## 2019-06-06 ENCOUNTER — Telehealth: Payer: Self-pay | Admitting: Psychiatry

## 2019-06-06 MED ORDER — LITHIUM CARBONATE ER 300 MG PO TBCR: EXTENDED_RELEASE_TABLET | ORAL | 1 refills | Status: DC

## 2019-06-06 NOTE — Telephone Encounter (Signed)
Pt. Made aware.

## 2019-06-06 NOTE — Telephone Encounter (Signed)
submitted

## 2019-06-06 NOTE — Telephone Encounter (Signed)
Pt called to request refill for Lithium XR @ [facility]. Also, ask what Dr.CC thinks about her taking NAC Supplements

## 2019-06-06 NOTE — Telephone Encounter (Signed)
Do I need to send in the lithium prescription ?

## 2019-06-06 NOTE — Telephone Encounter (Signed)
I am a big fan of NAC supplements 600 mg once or twice daily.  Can help cognition and has some potential Covid protection

## 2019-07-07 ENCOUNTER — Telehealth: Payer: Self-pay | Admitting: Psychiatry

## 2019-07-07 ENCOUNTER — Other Ambulatory Visit: Payer: Self-pay

## 2019-07-07 DIAGNOSIS — F250 Schizoaffective disorder, bipolar type: Secondary | ICD-10-CM

## 2019-07-07 MED ORDER — LOXAPINE SUCCINATE 50 MG PO CAPS: ORAL_CAPSULE | ORAL | 1 refills | Status: DC

## 2019-07-07 NOTE — Telephone Encounter (Signed)
Pt would like a rx for Loxapine to be sent to [facility].

## 2019-07-07 NOTE — Telephone Encounter (Signed)
Try sending to [facility] again the loxapine.  There have been other patients with recent success getting loxapine from [facility].

## 2019-07-07 NOTE — Telephone Encounter (Signed)
Noted will submit and contact them

## 2019-07-11 ENCOUNTER — Other Ambulatory Visit: Payer: Self-pay | Admitting: Psychiatry

## 2019-07-11 NOTE — Telephone Encounter (Signed)
Can you ask if she needs some locally too, looks like normally its [facility]

## 2019-07-11 NOTE — Telephone Encounter (Signed)
She stated she would like it sent through [facility].

## 2019-07-12 ENCOUNTER — Other Ambulatory Visit: Payer: Self-pay

## 2019-07-12 MED ORDER — TRAZODONE HCL 100 MG PO TABS: 100.0000 mg | ORAL_TABLET | Freq: Every day | ORAL | 1 refills | Status: DC

## 2019-07-23 ENCOUNTER — Other Ambulatory Visit: Payer: Self-pay | Admitting: Psychiatry

## 2019-07-25 ENCOUNTER — Other Ambulatory Visit: Payer: Self-pay

## 2019-07-25 MED ORDER — BUPROPION HCL ER (XL) 150 MG PO TB24: 150.0000 mg | ORAL_TABLET | Freq: Every day | ORAL | 0 refills | Status: DC

## 2019-07-25 NOTE — Telephone Encounter (Signed)
Uses [facility]

## 2019-07-28 ENCOUNTER — Encounter: Payer: Self-pay | Admitting: Psychiatry

## 2019-07-28 ENCOUNTER — Ambulatory Visit (INDEPENDENT_AMBULATORY_CARE_PROVIDER_SITE_OTHER): Payer: Medicare Other | Admitting: Psychiatry

## 2019-07-28 ENCOUNTER — Other Ambulatory Visit: Payer: Self-pay

## 2019-07-28 DIAGNOSIS — R7989 Other specified abnormal findings of blood chemistry: Secondary | ICD-10-CM

## 2019-07-28 DIAGNOSIS — F411 Generalized anxiety disorder: Secondary | ICD-10-CM | POA: Diagnosis not present

## 2019-07-28 DIAGNOSIS — F250 Schizoaffective disorder, bipolar type: Secondary | ICD-10-CM

## 2019-07-28 DIAGNOSIS — F4001 Agoraphobia with panic disorder: Secondary | ICD-10-CM

## 2019-07-28 DIAGNOSIS — Z79899 Other long term (current) drug therapy: Secondary | ICD-10-CM

## 2019-07-28 DIAGNOSIS — G251 Drug-induced tremor: Secondary | ICD-10-CM

## 2019-07-28 NOTE — Patient Instructions (Signed)
Reduce loxapine to 4 capsules and start Caplyta 1 daily for 5 days, Then reduce loxapine to 3 daily for 5 days, Then reduce loxapine to 2 daily for 5 days, Then reduce to 1 daily for 5 days then stop it.  Call us after about 2 weeks to verify doing OK on Caplyta so we can get approval for the medication.

## 2019-07-28 NOTE — Progress Notes (Signed)
Laura Mathews [facility] [id number] [birth year] 39 y.o.     Subjective:   Patient ID:  Laura Mathews is a 39 y.o. (DOB Jul 20, 1981) female.  Chief Complaint:  Chief Complaint  Patient presents with  . Follow-up    Medication management  . Anxiety    Medication management  . Other    Schizoaffective disorder, bipolar type  . Medication Problem    loxapine coverage    Anxiety Symptoms include decreased concentration. Patient reports no confusion, dizziness, nervous/anxious behavior or suicidal ideas.      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  When seen August 2020.  We reduced the Cytomel to 37.5 mcg bc of suppressed TSH.  She's noticed no problems.  At visit September 19, 2018.  At that visit we reduced Wellbutrin XL 300 to 150 because of anxiety and recently high blood pressure.    At her visit in April 2020.  She reported no problems from reducing the Wellbutrin and that her anxiety was somewhat better.  There were no changes made at the visit in April.  At visit October 2020.  No meds were changed.  [facility] which is a significant risk for this patient. She will try to access it.  Overall Loxapine has been very good to me but some movement disorder issues with toes moving, teeth grinding, Arm stiffness R, swallow involuntary movements.  Last seen May 25, 2019.  She remained on loxapine 300 mg.  Under stress she was still having hallucinations and she was given olanzapine 10 mg sublingual as needed auditory hallucinations.  She was continued on Wellbutrin XL 150 mg.  She was continued on Cytomel 37.5 mg every morning.  Also continued lithium 1200 mg daily and lamotrigine 200 mg daily. There was concern about the loxapine shortage and that she might have to be switched to another medication hopefully with low EPS risk such as Fanapt.  But she  has a history of orthostatic hypotension and it is unclear if she could tolerate that medication.  Patient seen with her live-in boyfriend today Laura Mathews. Overall she is continued to do pretty well with regard to depression.  She still has some residual anxiety.  She has intermittent auditory hallucinations they are mostly described as "noise" and occur mainly under stress.  She tried taking olanzapine as needed as suggested by [facility] but did not find it effective though she only used it once.  She is very concerned about having to switch from loxapine but the shortage continues and there is no access to additional medication.  She has about 2-week supply left.  She wants to know what alternatives there are for her.  Really well overall with ECT reduced to once monthly. Reports getting "modified bilateral"  ECT.   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 Twin sister about to have baby boy due in a week.  Laura Mathews is doing well.  Applying for better job.  Some are in other states.  Not worrying about it.  Together for 13 years.    Still hears noise most of the time if she pays attention but not hearing clear words.  Doesn't mind it much.  Only worse if gets really stressed or depressed.  Patient reports stable mood and denies depressed or irritable moods.  Patient reduced recent difficulty with anxiety and  she's pleased.  Occ bouts of anxiety daytime and needs Seroquel 75 mg when it occurs.  Walking more has helped and less depression improved motivation. Patient denies difficulty with sleep initiation or maintenance. Denies appetite disturbance.  Patient reports that energy and motivation have been good.  Patient denies any difficulty with concentration.  Patient denies any suicidal ideation.  No recent panic.   Quiet noise.  No further near syncope for several mos.  Thinks the depression may be helped by the Wellbutrin.  \ ECT to monthly.   Helped memory.  Asks about stretching it further.  However when she went 5-week intervals this last month she noticed more depression during the week preceding the ECT  Psychiatric medication history includes risperidone 3.5 mg EPS, Abilify 30 mg withdrawal dyskinesia, perphenazine, Geodon, olanzapine, Saphris 5 mg, Haldol, Latuda 160, loxapine 300 mg, Seroquel constipation and U px,   lamotrigine, Depakote,, lithium, clonazepam,  Fetzima, sertraline, Pristiq,, paroxetine,  citalopram, Lexapro, buspirone, Cytomel, topiramate, buspirone,, gabapentin, trazodone propranolol with no response for anxiety ECT maintenance Under our psychiatric care since 2006.  Remote history of OD Klonopin.  Review of Systems:  Review of Systems  Constitutional: Negative for fatigue.  Genitourinary: Positive for urgency.  Neurological: Positive for tremors. Negative for dizziness, weakness and headaches.  Psychiatric/Behavioral: Positive for decreased concentration. Negative for agitation, behavioral problems, confusion, dysphoric mood, hallucinations, self-injury, sleep disturbance and suicidal ideas. The patient is not nervous/anxious and is not hyperactive.     Medications: I have reviewed the patient's current medications.  Current Outpatient Medications  Medication Sig Dispense Refill  . b complex vitamins tablet Take 1 tablet by mouth daily.    [name] buPROPion (WELLBUTRIN XL) 150 MG 24 hr tablet Take 1 tablet (150 mg total) by mouth daily. 90 tablet 0  . Cholecalciferol (VITAMIN D3) 5000 units CAPS Take 1 capsule by mouth daily.    . clonazePAM (KLONOPIN) 0.5 MG tablet TAKE ONE TABLET BY MOUTH TWICE DAILY AND TWO TABLETS AT BEDTIME AS NEEDED 100 tablet 0  . fluticasone (FLONASE) 50 MCG/ACT nasal spray 1 spray by Each Nare route daily.    [name] lamoTRIgine (LAMICTAL) 200 MG tablet TAKE 1 TABLET DAILY 90 tablet 3  . liothyronine (CYTOMEL) 25 MCG tablet Take 1.5 tablets (37.5 mcg total) by mouth every morning. 135  tablet 0  . lithium carbonate (LITHOBID) 300 MG CR tablet TAKE 2 TABLETS (600MG) TWO TIMES A DAY 360 tablet 1  . loxapine (LOXITANE) 50 MG capsule TAKE 6 CAPSULES (300MG) AT BEDTIME 540 capsule 1  . Melatonin 10 MG CAPS Take 10 mg by mouth.    . Multiple Vitamins-Minerals (MULTIVITAMIN ADULT) CHEW Chew by mouth.    . OLANZapine zydis (ZYPREXA) 10 MG disintegrating tablet PLACE 1 TABLET (10 MG TOTAL) UNDER THE TONGUE DAILY AS NEEDED. 90 tablet 1  . Omega-3 Fatty Acids (FISH OIL) 1200 MG CAPS Take by mouth.    . QUEtiapine (SEROQUEL) 25 MG tablet TAKE 1 TO 2 TABLETS BY MOUTH TWICE A DAY AS NEEDED ANXIETY (Patient taking differently: TAKE 1 TO 3 TABLETS BY MOUTH TWICE A DAY AS NEEDED ANXIETY) 360 tablet 0  . sertraline (ZOLOFT) 100 MG tablet Take 2 tablets (200 mg total) by mouth daily. 180 tablet 1  . traZODone (DESYREL) 100 MG tablet Take 1 tablet (100 mg total) by mouth at bedtime. 90 tablet 1   No current facility-administered medications for this visit.    Medication Side Effects: Other: tremor, grinding teeth daytime, blurred vision,  mild right arm posturing, sleep 11 hours.  Not much daytime sleepiness.  Allergies:  Allergies  Allergen Reactions  . Metformin And Related   . Naltrexone     History reviewed. No pertinent past medical history.  History reviewed. No pertinent family history.  Social History   Socioeconomic History  . Marital status: Legally Separated    Spouse name: Not on file  . Number of children: Not on file  . Years of education: Not on file  . Highest education level: Not on file  Occupational History  . Not on file  Tobacco Use  . Smoking status: Never Smoker  . Smokeless tobacco: Never Used  Substance and Sexual Activity  . Alcohol use: Not on file  . Drug use: Not on file  . Sexual activity: Not on file  Other Topics Concern  . Not on file  Social History Narrative  . Not on file   Social Determinants of Health   Financial Resource Strain:    . Difficulty of Paying Living Expenses: Not on file  Food Insecurity:   . Worried About Charity fundraiser in the Last Year: Not on file  . Ran Out of Food in the Last Year: Not on file  Transportation Needs:   . Lack of Transportation (Medical): Not on file  . Lack of Transportation (Non-Medical): Not on file  Physical Activity:   . Days of Exercise per Week: Not on file  . Minutes of Exercise per Session: Not on file  Stress:   . Feeling of Stress : Not on file  Social Connections:   . Frequency of Communication with Friends and Family: Not on file  . Frequency of Social Gatherings with Friends and Family: Not on file  . Attends Religious Services: Not on file  . Active Member of Clubs or Organizations: Not on file  . Attends Archivist Meetings: Not on file  . Marital Status: Not on file  Intimate Partner Violence:   . Fear of Current or Ex-Partner: Not on file  . Emotionally Abused: Not on file  . Physically Abused: Not on file  . Sexually Abused: Not on file    Past Medical History, Surgical history, Social history, and Family history were reviewed and updated as appropriate.   Please see review of systems for further details on the patient's review from today.   Objective:   Physical Exam:  There were no vitals taken for this visit.  Physical Exam Constitutional:      General: She is not in acute distress.    Appearance: She is well-developed.  Neurological:     Mental Status: She is alert and oriented to person, place, and time.     Cranial Nerves: No dysarthria.  Psychiatric:        Attention and Perception: Attention normal. She perceives auditory hallucinations. She does not perceive visual hallucinations.        Mood and Affect: Mood is anxious. Mood is not depressed. Affect is not labile, blunt, angry, tearful or inappropriate.        Speech: Speech normal.        Behavior: Behavior normal. Behavior is cooperative.        Thought Content:  Thought content normal. Thought content is not paranoid or delusional. Thought content does not include homicidal or suicidal ideation. Thought content does not include homicidal or suicidal plan.        Cognition and Memory: Cognition and memory normal.  Judgment: Judgment normal.     Comments: Less depression and anxiety but residual. Always hear a little noise in my head but only worse if under a lot of stress.     Lab Review:     Component Value Date/Time   NA 140 10/10/2010 1150   K 3.8 10/10/2010 1150   CL 108 10/10/2010 1150   CO2 25 10/10/2010 1150   GLUCOSE 99 10/10/2010 1150   BUN 6 10/10/2010 1150   CREATININE 0.72 10/10/2010 1150   CALCIUM 9.8 10/10/2010 1150   PROT 7.5 10/10/2010 0207   ALBUMIN 4.1 10/10/2010 0207   AST 23 10/10/2010 0207   ALT 19 10/10/2010 0207   ALKPHOS 40 10/10/2010 0207   BILITOT 0.4 10/10/2010 0207   GFRNONAA >60 10/10/2010 1150   GFRAA  10/10/2010 1150    >60        The eGFR has been calculated using the MDRD equation. This calculation has not been validated in all clinical situations. eGFR's persistently <60 mL/min signify possible Chronic Kidney Disease.       Component Value Date/Time   WBC 16.7 (H) 10/10/2010 0207   RBC 4.50 10/10/2010 0207   HGB 12.7 04/14/2011 0956   HCT 40.0 10/10/2010 0223   PLT 225 10/10/2010 0207   MCV 86.7 10/10/2010 0207   MCH 28.7 10/10/2010 0207   MCHC 33.1 10/10/2010 0207   RDW 12.9 10/10/2010 0207   LYMPHSABS 3.3 10/10/2010 0207   MONOABS 1.7 (H) 10/10/2010 0207   EOSABS 0.4 10/10/2010 0207   BASOSABS 0.1 10/10/2010 0207    Lithium Lvl  Date Value Ref Range Status  12/27/2018 1.1 0.6 - 1.2 mmol/L Final    Comment:                                     Detection Limit = 0.1                           <0.1 indicates None Detected     TSH low on Cytomel in June 2020 No results found for: PHENYTOIN, PHENOBARB, VALPROATE, CBMZ  Last lithium level on 06/29/2018 was 1.3 which is typical  for her.  She needs high normal lithium level for stability.  Her vitamin D level was 70 which is good. .res Assessment: Plan:    Laura Mathews was seen today for follow-up, anxiety, other and medication problem.  Diagnoses and all orders for this visit:  Schizoaffective disorder, bipolar type ([place])  Generalized anxiety disorder  Panic disorder with agoraphobia  Lithium-induced tremor  Low serum vitamin D  Lithium use    Greater than 50% of 40 minutes face to face time with patient was spent on counseling and coordination of care.  Laura Mathews is chronically unstable and prone to extrapyramidal side effects complicating treatment.  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 Specifically the command hallucinations to kill her self have resolved.   She is not significantly depressed at this time.  She continues maintenance ECT  Continue Cytomel to 1 1/2 of 25 mcg tablets each AM.  She's not having palpitations nor anxiety.     Because we are going to have to change the antipsychotic we will not be considering  other medication changes today.  continue Wellbutrin to 150 .  Depression is better so far with  The Wellbutrin.  Disc alternative TMS to ECT.  Just she talked to the ECT team in [facility] about this. Continue ECT monthly for a few more months at least until spring.  Extensive discussion of clozapine in detail.  We may be forced to switch to this if we cannot obtain loxapine given the shortage.  Including the risk of severe neutropenia, marked weight gain, sedation, metabolic problems, cardiac risk, etc.  Discussed the need for weekly blood test for at least 6 months.  However we also discussed this is the most effective antipsychotic on the market.  It matter may  better control her voices.  She wishes to defer because she is feeling better.  Per FDA website, loxapine should be available again this month but it's not available now so will have to plan for alternative.  Most effective option clozapine.  oPTION Fanapt.  She has had less orthostatic hypotension in the last year than she has had in previous years.  She may tolerate this better now and it is much easier to use than clozapine. Lower SE risk option Caplyta. 42 mg daily.  Shared PDF and explained SE risk.  She prefers that option. Taper Loxapine  Reduce loxapine to 4 capsules and start Caplyta 1 daily for 5 days, Then reduce loxapine to 3 daily for 5 days, Then reduce loxapine to 2 daily for 5 days, Then reduce to 1 daily for 5 days then stop it.  Call us after about 2 weeks to verify doing OK on Caplyta so we can get approval for the medication.  Her low vitamin D level is supplemented and resolved with supplemental vitamin D.  Low Vitamin D increases the risk of depression  Lithium level recently with PCP and TSH and was told that both were normal.  Per [facility] [place], prn for hallucinations bc expects more at the holidays and already on hiogh dose loxapine.  Need one with lower EPS.  Olanzapine SL 10 prn AH.  She took it once and it didn't seem to help  Follow-up 4 weeks  Lynder Parents, MD, DFAPA  Please see After Visit Summary for patient specific instructions.  Future Appointments  Date Time Provider [place]  08/28/2019 10:30 AM Cottle, Billey Co., MD [facility] None    No orders of the defined types were placed in this encounter.     -------------------------------

## 2019-08-10 ENCOUNTER — Other Ambulatory Visit: Payer: Self-pay | Admitting: Psychiatry

## 2019-08-11 ENCOUNTER — Other Ambulatory Visit: Payer: Self-pay

## 2019-08-11 ENCOUNTER — Telehealth: Payer: Self-pay | Admitting: Psychiatry

## 2019-08-11 MED ORDER — CAPLYTA 42 MG PO CAPS: 42.0000 mg | ORAL_CAPSULE | Freq: Every day | ORAL | 1 refills | Status: DC

## 2019-08-11 NOTE — Telephone Encounter (Signed)
Laura Mathews called to request refill of her caplyta.  Appt 08/28/19.  Send to [facility] order.  Will need to be 90 day supply.  Also she wants to know when she needs to get her lithium levels checked again. Please call.

## 2019-08-14 ENCOUNTER — Telehealth: Payer: Self-pay

## 2019-08-14 NOTE — Telephone Encounter (Signed)
Patient's prior authorization for Caplyta 42 mg capsule approved effective 07/21/2019-08/13/2020 Through [facility]

## 2019-08-14 NOTE — Telephone Encounter (Signed)
Prior authorization submitted for Caplyta 42 mg capsule through [facility] approved effective 07/21/2019-08/13/2020  ID#  [id number]

## 2019-08-15 ENCOUNTER — Other Ambulatory Visit: Payer: Self-pay

## 2019-08-15 ENCOUNTER — Other Ambulatory Visit: Payer: Self-pay | Admitting: Psychiatry

## 2019-08-15 ENCOUNTER — Telehealth: Payer: Self-pay | Admitting: Psychiatry

## 2019-08-15 DIAGNOSIS — Z79899 Other long term (current) drug therapy: Secondary | ICD-10-CM

## 2019-08-15 DIAGNOSIS — F250 Schizoaffective disorder, bipolar type: Secondary | ICD-10-CM

## 2019-08-15 DIAGNOSIS — G251 Drug-induced tremor: Secondary | ICD-10-CM

## 2019-08-15 MED ORDER — LITHIUM CARBONATE ER 300 MG PO TBCR: EXTENDED_RELEASE_TABLET | ORAL | 1 refills | Status: DC

## 2019-08-15 MED ORDER — LITHIUM CARBONATE ER 300 MG PO TBCR: 600.0000 mg | EXTENDED_RELEASE_TABLET | Freq: Two times a day (BID) | ORAL | 0 refills | Status: DC

## 2019-08-15 NOTE — Telephone Encounter (Signed)
Lithium carbonate 300 mg 2 bid #360 with 1 refill submitted to [facility]  Lithium carbonate 300 mg 2 bid #56 no refills [facility] local [facility]

## 2019-08-15 NOTE — Telephone Encounter (Signed)
Pt states that [facility] sent her the wrong # of pills on last fill of Lithium Carbonate and she is run out early. They said we could authorize a 14 day supply to a local [facility]. Please send 14 day to [facility] on [address] in [place]. Then please send another 90 day RX for it to [facility]. They indicated to her they do not have an RX on file.  Thanks.

## 2019-08-15 NOTE — Telephone Encounter (Signed)
Yes I would like to get a lithium level.  Sent lab orders to [facility] to pick any [facility] she wants to attend and they should have her lab results.  Remind her if she takes a morning dose of lithium skip the lithium until after the blood test on the morning of the test.  No fasting required.

## 2019-08-16 NOTE — Telephone Encounter (Signed)
Left her a VM to return my call.

## 2019-08-17 ENCOUNTER — Other Ambulatory Visit: Payer: Self-pay

## 2019-08-17 ENCOUNTER — Telehealth: Payer: Self-pay | Admitting: Psychiatry

## 2019-08-17 MED ORDER — CAPLYTA 42 MG PO CAPS: 42.0000 mg | ORAL_CAPSULE | Freq: Every day | ORAL | 0 refills | Status: DC

## 2019-08-17 NOTE — Telephone Encounter (Signed)
Left pt a detailed message and asked to call back with any questions or concerns.

## 2019-08-17 NOTE — Telephone Encounter (Signed)
Lithium has already been submitted to [facility].  Will submit Caplyta to her local pharmacy [facility]

## 2019-08-17 NOTE — Telephone Encounter (Signed)
Patient called and said that a 3 month supply of her lithium needs to be sent to [facility]. Also she wants a 10 day supply of caplyta 42 mg she is waiting on a refill.

## 2019-08-20 ENCOUNTER — Other Ambulatory Visit: Payer: Self-pay | Admitting: Psychiatry

## 2019-08-21 ENCOUNTER — Telehealth: Payer: Self-pay | Admitting: Psychiatry

## 2019-08-21 NOTE — Telephone Encounter (Signed)
Okay to stop Caplyta and restart loxapine.  Restart loxapine 50 mg capsules 2 nightly for 2 nights, then 3 nightly for 2 nights, then 4 nightly for 2 nights, then go back up to 6 nightly as she was on before.

## 2019-08-21 NOTE — Telephone Encounter (Signed)
Pt having bad reaction to the new med she has started on. Pt having movements in her face that she can't control,nausea. Pt found loxapine pills so she said that she could start back on those if you think it will help.

## 2019-08-21 NOTE — Telephone Encounter (Signed)
PT. Made aware and verbalized understanding of instructions.

## 2019-08-28 ENCOUNTER — Other Ambulatory Visit: Payer: Self-pay

## 2019-08-28 ENCOUNTER — Encounter: Payer: Self-pay | Admitting: Psychiatry

## 2019-08-28 ENCOUNTER — Ambulatory Visit (INDEPENDENT_AMBULATORY_CARE_PROVIDER_SITE_OTHER): Payer: Medicare Other | Admitting: Psychiatry

## 2019-08-28 DIAGNOSIS — F411 Generalized anxiety disorder: Secondary | ICD-10-CM | POA: Diagnosis not present

## 2019-08-28 DIAGNOSIS — F4001 Agoraphobia with panic disorder: Secondary | ICD-10-CM | POA: Diagnosis not present

## 2019-08-28 DIAGNOSIS — R7989 Other specified abnormal findings of blood chemistry: Secondary | ICD-10-CM | POA: Diagnosis not present

## 2019-08-28 DIAGNOSIS — F250 Schizoaffective disorder, bipolar type: Secondary | ICD-10-CM

## 2019-08-28 DIAGNOSIS — Z79899 Other long term (current) drug therapy: Secondary | ICD-10-CM

## 2019-08-28 DIAGNOSIS — G251 Drug-induced tremor: Secondary | ICD-10-CM

## 2019-08-28 DIAGNOSIS — R55 Syncope and collapse: Secondary | ICD-10-CM

## 2019-08-28 DIAGNOSIS — E559 Vitamin D deficiency, unspecified: Secondary | ICD-10-CM

## 2019-08-28 NOTE — Patient Instructions (Signed)
Gingko biloba 240 mg twice daily  Vitamin E 400 units daily

## 2019-08-28 NOTE — Progress Notes (Signed)
Laura Mathews [id number] 07/25/1980 39 y.o.     Subjective:   Patient ID:  Koreen W Wunschel is a 39 y.o. (DOB 01/27/1981) female.  Chief Complaint:  Chief Complaint  Patient presents with  . Follow-up    Medication Management  . Other    Schizoaffective disorder, bipolar type  . Medication Refill    Loxapine    Anxiety Symptoms include decreased concentration. Patient reports no confusion, dizziness, nervous/anxious behavior or suicidal ideas.    Medication Refill Pertinent negatives include no fatigue, headaches or weakness.    Safire W Villalpando presents  today for follow-up of schizoaffective depression with hallucinations and anxiety.  When seen August 2020.  We reduced the Cytomel to 37.5 mcg bc of suppressed TSH.  She's noticed no problems.  At visit September 19, 2018.  At that visit we reduced Wellbutrin XL 300 to 150 because of anxiety and recently high blood pressure.    At her visit in April 2020.  She reported no problems from reducing the Wellbutrin and that her anxiety was somewhat better.  There were no changes made at the visit in April.  At visit October 2020.  No meds were changed.  [facility] which is a significant risk for this patient. She will try to access it.  Overall Loxapine has been very good to me but some movement disorder issues with toes moving, teeth grinding, Arm stiffness R, swallow involuntary movements.  seen May 25, 2019.  She remained on loxapine 300 mg.  Under stress she was still having hallucinations and she was given olanzapine 10 mg sublingual as needed auditory hallucinations.  She was continued on Wellbutrin XL 150 mg.  She was continued on Cytomel 37.5 mg every morning.  Also continued lithium 1200 mg daily and lamotrigine 200 mg daily. There was concern about the loxapine shortage and that she might have to be switched to another medication hopefully with low EPS  risk such as Fanapt.  But she has a history of orthostatic hypotension and it is unclear if she could tolerate that medication.  Last seen July 28, 2019.  Because of the shortage of loxapine we had to wean and discontinue that medication.  She was given Caplyta as an alternative antipsychotic given multiple other failures and her tendency to be very sensitive to EPS and tardive dyskinesia.  Patient took it for about 3 weeks and then called February 1 stating that she was having nausea and involuntary facial movements and had found loxapine and wanted to return to it.  Therefore Caplyta was stopped and she was restarted on loxapine to quickly titrate back up to 300 mg nightly the same dose that she is taken in the past.  Today FEB 8 seen with her mother.  Ran out of Caplyta for 3 days bc pharmacy didn't have it.  Just back to 300 mg loxapine as of last night.  Slept fine on Caplyta with less duration needed.  Mood pretty stable and not markedly depressed.  Today feels a ltttle psychotic with voices and feeling people are inside her a little confusion.  Movements from mouth have seemed better.  Has felt a little tight throat that scared her.  Mo notes facial grimacing and lip pursing has almost totally gone.    Really well overall with ECT reduced to once monthly. Reports getting "modified bilateral"  ECT.   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 Twin sister had baby boy .  [name] is doing well.  Applying for better job.  Some are in other states.  Not worrying about it.  Together for 13 years.    Patient reports stable mood and denies depressed or irritable moods.  More anxiety lately with more hallucinations.  Walking more has helped and less depression improved motivation. Patient denies difficulty with sleep initiation or maintenance. Denies appetite disturbance.  Patient reports that energy and motivation  have been good.  Patient denies any difficulty with concentration.  Patient denies any suicidal ideation.  No recent panic.   Quiet noise.  No further near syncope for several mos.  Thinks the depression may be helped by the Wellbutrin.  _0 ECT to monthly.  Helped memory.  Asks about stretching it further.  However when she went 5-week intervals this last month she noticed more depression during the week preceding the ECT  Psychiatric medication history includes risperidone 3.5 mg EPS, Abilify 30 mg withdrawal dyskinesia, perphenazine, Geodon, olanzapine, Saphris 5 mg, Haldol, Latuda 160, loxapine 300 mg, Seroquel constipation and U px,   lamotrigine, Depakote,, lithium, clonazepam,  Fetzima, sertraline, Pristiq,, paroxetine,  citalopram, Lexapro, buspirone, Cytomel, topiramate, buspirone,, gabapentin, trazodone propranolol with no response for anxiety ECT maintenance Under our psychiatric care since 2006.  Remote history of OD Klonopin.  Review of Systems:  Review of Systems  Constitutional: Negative for fatigue.  Genitourinary: Positive for urgency.  Neurological: Positive for tremors. Negative for dizziness, weakness and headaches.  Psychiatric/Behavioral: Positive for decreased concentration. Negative for agitation, behavioral problems, confusion, dysphoric mood, hallucinations, self-injury, sleep disturbance and suicidal ideas. The patient is not nervous/anxious and is not hyperactive.     Medications: I have reviewed the patient's current medications.  Current Outpatient Medications  Medication Sig Dispense Refill  . Acetylcysteine (NAC) 600 MG CAPS Take 600 mg by mouth 2 (two) times daily.    [name] b complex vitamins tablet Take 1 tablet by mouth daily.    [name] buPROPion (WELLBUTRIN XL) 150 MG 24 hr tablet Take 1 tablet (150 mg total) by mouth daily. 90 tablet 0  . Cholecalciferol (VITAMIN D3) 5000 units CAPS Take 1 capsule by mouth daily.    . clonazePAM (KLONOPIN) 0.5 MG tablet TAKE  ONE TABLET BY MOUTH TWICE DAILY AND TWO TABLETS AT BEDTIME AS NEEDED 100 tablet 0  . fluticasone (FLONASE) 50 MCG/ACT nasal spray 1 spray by Each Nare route daily.    [name] lamoTRIgine (LAMICTAL) 200 MG tablet TAKE 1 TABLET DAILY 90 tablet 3  . liothyronine (CYTOMEL) 25 MCG tablet TAKE 1 AND 1/2 TABLETS     EVERY MORNING 135 tablet 0  . lithium carbonate (LITHOBID) 300 MG CR tablet TAKE 2 TABLETS (600MG) TWO TIMES A DAY 360 tablet 1  . loxapine (LOXITANE) 50 MG capsule TAKE 6 CAPSULES (300MG) AT BEDTIME 540 capsule 1  . Melatonin 10 MG CAPS Take 10 mg by mouth.    . Multiple Vitamins-Minerals (MULTIVITAMIN ADULT) CHEW Chew by mouth.    . OLANZapine zydis (ZYPREXA) 10 MG disintegrating tablet PLACE 1 TABLET (10 MG TOTAL) UNDER THE TONGUE DAILY AS NEEDED. 90 tablet 1  . Omega-3 Fatty Acids (FISH OIL) 1200 MG CAPS Take by mouth.    . pyridOXINE (VITAMIN B-6) 100 MG tablet Take 100 mg by mouth daily.    . QUEtiapine (SEROQUEL) 25 MG tablet TAKE 1 TO 2 TABLETS BY MOUTH TWICE A DAY  AS NEEDED ANXIETY (Patient taking differently: TAKE 1 TO 3 TABLETS BY MOUTH TWICE A DAY AS NEEDED ANXIETY) 360 tablet 0  . sertraline (ZOLOFT) 100 MG tablet TAKE 2 TABLETS (200MG)     DAILY 180 tablet 1  . traZODone (DESYREL) 100 MG tablet Take 1 tablet (100 mg total) by mouth at bedtime. 90 tablet 1  . vitamin E 1000 UNIT capsule Take 1,000 Units by mouth daily.     No current facility-administered medications for this visit.    Medication Side Effects: Other: tremor, grinding teeth daytime, blurred vision, mild right arm posturing, sleep 11 hours.  Not much daytime sleepiness.  Allergies:  Allergies  Allergen Reactions  . Metformin And Related   . Naltrexone     History reviewed. No pertinent past medical history.  History reviewed. No pertinent family history.  Social History   Socioeconomic History  . Marital status: Legally Separated    Spouse name: Not on file  . Number of children: Not on file  . Years  of education: Not on file  . Highest education level: Not on file  Occupational History  . Not on file  Tobacco Use  . Smoking status: Never Smoker  . Smokeless tobacco: Never Used  Substance and Sexual Activity  . Alcohol use: Not on file  . Drug use: Not on file  . Sexual activity: Not on file  Other Topics Concern  . Not on file  Social History Narrative  . Not on file   Social Determinants of Health   Financial Resource Strain:   . Difficulty of Paying Living Expenses: Not on file  Food Insecurity:   . Worried About Running Out of Food in the Last Year: Not on file  . Ran Out of Food in the Last Year: Not on file  Transportation Needs:   . Lack of Transportation (Medical): Not on file  . Lack of Transportation (Non-Medical): Not on file  Physical Activity:   . Days of Exercise per Week: Not on file  . Minutes of Exercise per Session: Not on file  Stress:   . Feeling of Stress : Not on file  Social Connections:   . Frequency of Communication with Friends and Family: Not on file  . Frequency of Social Gatherings with Friends and Family: Not on file  . Attends Religious Services: Not on file  . Active Member of Clubs or Organizations: Not on file  . Attends Club or Organization Meetings: Not on file  . Marital Status: Not on file  Intimate Partner Violence:   . Fear of Current or Ex-Partner: Not on file  . Emotionally Abused: Not on file  . Physically Abused: Not on file  . Sexually Abused: Not on file    Past Medical History, Surgical history, Social history, and Family history were reviewed and updated as appropriate.   Please see review of systems for further details on the patient's review from today.   Objective:   Physical Exam:  There were no vitals taken for this visit.  Physical Exam Constitutional:      General: She is not in acute distress.    Appearance: She is well-developed.  Neurological:     Mental Status: She is alert and oriented to  person, place, and time.     Cranial Nerves: No dysarthria.  Psychiatric:        Attention and Perception: Attention normal. She perceives auditory hallucinations. She does not perceive visual hallucinations.          Mood and Affect: Mood is anxious. Mood is not depressed. Affect is not labile, blunt, angry, tearful or inappropriate.        Speech: Speech normal.        Behavior: Behavior normal. Behavior is cooperative.        Thought Content: Thought content normal. Thought content is not paranoid or delusional. Thought content does not include homicidal or suicidal ideation. Thought content does not include homicidal or suicidal plan.        Cognition and Memory: Cognition and memory normal.        Judgment: Judgment normal.     Comments: Less depression and anxiety but residual. Recent worsening auditory hallucinations with transition off loxapine and then forgetting several days of Caplyta     Lab Review:     Component Value Date/Time   NA 140 10/10/2010 1150   K 3.8 10/10/2010 1150   CL 108 10/10/2010 1150   CO2 25 10/10/2010 1150   GLUCOSE 99 10/10/2010 1150   BUN 6 10/10/2010 1150   CREATININE 0.72 10/10/2010 1150   CALCIUM 9.8 10/10/2010 1150   PROT 7.5 10/10/2010 0207   ALBUMIN 4.1 10/10/2010 0207   AST 23 10/10/2010 0207   ALT 19 10/10/2010 0207   ALKPHOS 40 10/10/2010 0207   BILITOT 0.4 10/10/2010 0207   GFRNONAA >60 10/10/2010 1150   GFRAA  10/10/2010 1150    >60        The eGFR has been calculated using the MDRD equation. This calculation has not been validated in all clinical situations. eGFR's persistently <60 mL/min signify possible Chronic Kidney Disease.       Component Value Date/Time   WBC 16.7 (H) 10/10/2010 0207   RBC 4.50 10/10/2010 0207   HGB 12.7 04/14/2011 0956   HCT 40.0 10/10/2010 0223   PLT 225 10/10/2010 0207   MCV 86.7 10/10/2010 0207   MCH 28.7 10/10/2010 0207   MCHC 33.1 10/10/2010 0207   RDW 12.9 10/10/2010 0207   LYMPHSABS 3.3  10/10/2010 0207   MONOABS 1.7 (H) 10/10/2010 0207   EOSABS 0.4 10/10/2010 0207   BASOSABS 0.1 10/10/2010 0207    Lithium Lvl  Date Value Ref Range Status  12/27/2018 1.1 0.6 - 1.2 mmol/L Final    Comment:                                     Detection Limit = 0.1                           <0.1 indicates None Detected     TSH low on Cytomel in June 2020 No results found for: PHENYTOIN, PHENOBARB, VALPROATE, CBMZ  lithium level on 06/29/2018 was 1.3 which is typical for her.  She needs high normal lithium level for stability.  Her vitamin D level was 70 which is good.  Last lithium level 03/2019 0.89. .res Assessment: Plan:    Alaia was seen today for follow-up, other and medication refill.  Diagnoses and all orders for this visit:  Schizoaffective disorder, bipolar type (HCC) -     Basic metabolic panel -     Lithium level  Generalized anxiety disorder  Panic disorder with agoraphobia  Low serum vitamin D  Lithium use -     Basic metabolic panel -     Lithium level  Lithium-induced tremor  Vitamin D deficiency   disease  Syncope and collapse    Greater than 50% of 40 minutes face to face time with patient was spent on counseling and coordination of care.  Oree is chronically unstable and prone to extrapyramidal side effects complicating treatment.  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 Specifically the command hallucinations to kill her self have resolved.   She is not significantly depressed at this time.  She continues maintenance ECT  Continue Cytomel to 1 1/2 of 25 mcg tablets each AM.  She's not having palpitations nor anxiety.     Because we are going to have to change the antipsychotic we will not be considering other medication  changes today.  continue Wellbutrin to 150 .  Depression is better so far with  The Wellbutrin.  Disc alternative TMS to ECT.  Just she talked to the ECT team in [facility] about this. Continue ECT monthly for a few more months at least until spring.  Extensive discussion of clozapine in detail.  We may be forced to switch to this if we cannot obtain loxapine given the shortage.  Including the risk of severe neutropenia, marked weight gain, sedation, metabolic problems, cardiac risk, etc.  Discussed the need for weekly blood test for at least 6 months.  However we also discussed this is the most effective antipsychotic on the market.  It matter may better control her voices.  She wishes to defer.  Per FDA website, loxapine should be available again this month but it's not available now so will have to plan for alternative.  Most effective option clozapine.  oPTION Fanapt.  She has had less orthostatic hypotension in the last year than she has had in previous years.  She may tolerate this better now and it is much easier to use than clozapine. Lower SE risk option Caplyta. 42 mg daily.  Shared PDF and explained SE risk.  She prefers that option. Taper Loxapine   Keep Caplyta as a back of option bc prior problems withit occurred after missing it for several days.    Her low vitamin D level is supplemented and resolved with supplemental vitamin D.  Low Vitamin D increases the risk of depression  Per [facility] [place], prn for hallucinations bc expects more at the holidays and already on hiogh dose loxapine.  Need one with lower EPS.  Olanzapine SL 10 prn AH.  She took it once and it didn't seem to help  Follow-up 4 weeks  Lynder Parents, MD, DFAPA  Please see After Visit Summary for patient specific instructions.  No future appointments.  Orders Placed This Encounter  Procedures  . Basic metabolic panel  . Lithium level      -------------------------------

## 2019-08-31 ENCOUNTER — Other Ambulatory Visit: Payer: Self-pay | Admitting: Psychiatry

## 2019-08-31 DIAGNOSIS — F250 Schizoaffective disorder, bipolar type: Secondary | ICD-10-CM

## 2019-08-31 MED ORDER — LOXAPINE SUCCINATE 50 MG PO CAPS: ORAL_CAPSULE | ORAL | 0 refills | Status: DC

## 2019-08-31 NOTE — Telephone Encounter (Signed)
Rx for Loxapine submitted to [facility]   [facility], had visit Monday

## 2019-08-31 NOTE — Telephone Encounter (Signed)
Pt called to request refill for Loxapine 50 mg 6/d @ [facility] in [place]

## 2019-09-08 ENCOUNTER — Telehealth: Payer: Self-pay | Admitting: Psychiatry

## 2019-09-08 NOTE — Telephone Encounter (Signed)
Pt called to ask how much Vitamin E and Ginkgo Biloba should she be taking Call [phone]

## 2019-09-12 NOTE — Telephone Encounter (Signed)
Please give her this info: Vitamin E 400 units daily Gingko biloba 120 mg twice daily

## 2019-09-19 ENCOUNTER — Telehealth: Payer: Self-pay | Admitting: Psychiatry

## 2019-09-19 ENCOUNTER — Other Ambulatory Visit: Payer: Self-pay

## 2019-09-19 MED ORDER — QUETIAPINE FUMARATE 25 MG PO TABS: ORAL_TABLET | ORAL | 0 refills | Status: DC

## 2019-09-19 NOTE — Telephone Encounter (Signed)
Pt tried to call pharmacy for a refill on her Seroquel. She was told since it has been awhile she will need a new rx. Please send to [facility].

## 2019-09-19 NOTE — Telephone Encounter (Signed)
Updated RX for Seroquel to submit to [facility]

## 2019-09-29 LAB — BASIC METABOLIC PANEL
BUN/Creatinine Ratio: 20 (ref 9–23)
BUN: 16 mg/dL (ref 6–20)
CO2: 23 mmol/L (ref 20–29)
Calcium: 10.3 mg/dL — ABNORMAL HIGH (ref 8.7–10.2)
Chloride: 103 mmol/L (ref 96–106)
Creatinine, Ser: 0.81 mg/dL (ref 0.57–1.00)
GFR calc Af Amer: 108 mL/min/{1.73_m2} (ref >59)
GFR calc non Af Amer: 94 mL/min/{1.73_m2} (ref >59)
Glucose: 93 mg/dL (ref 65–99)
Potassium: 4.2 mmol/L (ref 3.5–5.2)
Sodium: 138 mmol/L (ref 134–144)

## 2019-09-29 LAB — LITHIUM LEVEL: Lithium Lvl: 1 mmol/L (ref 0.6–1.2)

## 2019-10-02 ENCOUNTER — Encounter: Payer: Self-pay | Admitting: Psychiatry

## 2019-10-02 ENCOUNTER — Ambulatory Visit (INDEPENDENT_AMBULATORY_CARE_PROVIDER_SITE_OTHER): Payer: Medicare Other | Admitting: Psychiatry

## 2019-10-02 DIAGNOSIS — F250 Schizoaffective disorder, bipolar type: Secondary | ICD-10-CM | POA: Diagnosis not present

## 2019-10-02 NOTE — Progress Notes (Signed)
Laura Mathews [facility] [id number] 1981/05/15 39 y.o.   Virtual Visit via WebEX  I connected with pt by WebEx and verified that I am speaking with the correct person using two identifiers.   I discussed the limitations, risks, security and privacy concerns of performing an evaluation and management service by Laura Mathews and the availability of in person appointments. I also discussed with the patient that there may be a patient responsible charge related to this service. The patient expressed understanding and agreed to proceed.  I discussed the assessment and treatment plan with the patient. The patient was provided an opportunity to ask questions and all were answered. The patient agreed with the plan and demonstrated an understanding of the instructions.   The patient was advised to call back or seek an in-person evaluation if the symptoms worsen or if the condition fails to improve as anticipated.  I provided 30 minutes of video time during this encounter. The call started at 300 and ended at 3:30. The patient was located at home and the provider was located office.   Subjective:   Patient ID:  Laura Mathews is a 39 y.o. (DOB 1981/07/13) female.  Chief Complaint:  No chief complaint on file.   Medication Refill Associated symptoms include neck pain. Pertinent negatives include no fatigue, headaches or weakness.  Anxiety Symptoms include decreased concentration. Patient reports no confusion, dizziness, nervous/anxious behavior or suicidal ideas.      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  When seen August 2020.  We reduced the Cytomel to 37.5 mcg bc of suppressed TSH.  She's noticed no problems.  At visit September 19, 2018.  At that visit we reduced Wellbutrin XL 300 to 150 because of anxiety and recently high blood pressure.    At her visit in April 2020.  She reported no problems from reducing the  Wellbutrin and that her anxiety was somewhat better.  There were no changes made at the visit in April.  At visit October 2020.  No meds were changed.  Have gotten notice about [facility] not having access to loxapine which is a significant risk for this patient. She will try to access it.  Overall Loxapine has been very good to me but some movement disorder issues with toes moving, teeth grinding, Arm stiffness R, swallow involuntary movements.  seen May 25, 2019.  She remained on loxapine 300 mg.  Under stress she was still having hallucinations and she was given olanzapine 10 mg sublingual as needed auditory hallucinations.  She was continued on Wellbutrin XL 150 mg.  She was continued on Cytomel 37.5 mg every morning.  Also continued lithium 1200 mg daily and lamotrigine 200 mg daily. There was concern about the loxapine shortage and that she might have to be switched to another medication hopefully with low EPS risk such as Fanapt.  But she has a history of orthostatic hypotension and it is unclear if she could tolerate that medication.  seen July 28, 2019.  Because of the shortage of loxapine we had to wean and discontinue that medication.  She was given Caplyta as an alternative antipsychotic given multiple other failures and her tendency to be very sensitive to EPS and tardive dyskinesia. Patient took it for about 3 weeks and then called February 1 stating that she was having nausea and involuntary facial movements and had found loxapine and wanted to return to it.  Therefore Caplyta was stopped and she was restarted  on loxapine to quickly titrate back up to 300 mg nightly the same dose that she is taken in the past.  Last seen FEB 8 seen with her mother.  Ran out of [place] for 3 days bc pharmacy didn't have it.  Just back to 300 mg loxapine as of last night.  Slept fine on Caplyta with less duration needed. Mood pretty stable and not markedly depressed.  Today feels a ltttle psychotic with  voices and feeling people are inside her a little confusion.  Movements from mouth have seemed better.  Has felt a little tight throat that scared her.  Mo notes facial grimacing and lip pursing has almost totally gone.   Mood really well overall with ECT reduced to once monthly. Reports getting "modified bilateral"  ECT.   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 Twin sister had baby boy .  Laura Mathews is doing well.  Applying for better job.  Some are in other states.  Not worrying about it.  Together for 13 years.    As of October 02, 2019, Really well overall.   Not manic and "not too psychotic".  Voices down to mostly noise.   Patient reports stable mood and denies depressed or irritable moods.  Anxiety is better and using clonazepam or Seroquel.    Walking more has helped and less depression improved motivation. Patient denies difficulty with sleep initiation or maintenance. Denies appetite disturbance.  Patient reports that energy and motivation have been good.  Patient denies any difficulty with concentration.  Patient denies any suicidal ideation.  No recent panic.   Quiet noise.  No further near syncope for several mos.   SE overall been OK until lately some neck and back pain ? Related.    Thinks the depression may be helped by the Wellbutrin.  \ ECT still monthly.  Helped memory.  Asks about stretching it further.  However when she went 5-week intervals this last month she noticed more depression during the week preceding the ECT  Psychiatric medication history includes risperidone 3.5 mg EPS, Abilify 30 mg withdrawal dyskinesia, perphenazine, Geodon, olanzapine, Saphris 5 mg, Haldol, Latuda 160, loxapine 300 mg, Seroquel constipation and U px,   lamotrigine, Depakote,, lithium, clonazepam,  Fetzima, sertraline, Pristiq,, paroxetine,  citalopram, Lexapro, buspirone, Cytomel, topiramate, buspirone,, gabapentin,  trazodone propranolol with no response for anxiety ECT maintenance Under our psychiatric care since 2006.  Remote history of OD Klonopin.  Review of Systems:  Review of Systems  Constitutional: Negative for fatigue.  Genitourinary: Positive for urgency.  Musculoskeletal: Positive for back pain and neck pain.  Neurological: Positive for tremors. Negative for dizziness, weakness and headaches.  Psychiatric/Behavioral: Positive for decreased concentration. Negative for agitation, behavioral problems, confusion, dysphoric mood, hallucinations, self-injury, sleep disturbance and suicidal ideas. The patient is not nervous/anxious and is not hyperactive.     Medications: I have reviewed the patient's current medications.  Current Outpatient Medications  Medication Sig Dispense Refill  . Acetylcysteine (NAC) 600 MG CAPS Take 600 mg by mouth 2 (two) times daily.    [name] b complex vitamins tablet Take 1 tablet by mouth daily.    [name] buPROPion (WELLBUTRIN XL) 150 MG 24 hr tablet TAKE 1 TABLET DAILY 90 tablet 0  . Cholecalciferol (VITAMIN D3) 5000 units CAPS Take 1 capsule by mouth daily.    . clonazePAM (KLONOPIN) 0.5 MG tablet TAKE ONE TABLET BY MOUTH TWICE  DAILY AND TWO TABLETS AT BEDTIME AS NEEDED 100 tablet 0  . fluticasone (FLONASE) 50 MCG/ACT nasal spray 1 spray by Each Nare route daily.    [name]. lamoTRIgine (LAMICTAL) 200 MG tablet TAKE 1 TABLET DAILY 90 tablet 3  . liothyronine (CYTOMEL) 25 MCG tablet TAKE 1 AND 1/2 TABLETS     EVERY MORNING 135 tablet 0  . lithium carbonate (LITHOBID) 300 MG CR tablet TAKE 2 TABLETS (600MG ) TWO TIMES A DAY 360 tablet 1  . loxapine (LOXITANE) 50 MG capsule TAKE 6 CAPSULES (300MG ) AT BEDTIME 540 capsule 0  . Melatonin 10 MG CAPS Take 10 mg by mouth.    . Multiple Vitamins-Minerals (MULTIVITAMIN ADULT) CHEW Chew by mouth.    . OLANZapine zydis (ZYPREXA) 10 MG disintegrating tablet PLACE 1 TABLET (10 MG TOTAL) UNDER THE TONGUE DAILY AS NEEDED. 90 tablet 1  . Omega-3  Fatty Acids (FISH OIL) 1200 MG CAPS Take by mouth.    . pyridOXINE (VITAMIN B-6) 100 MG tablet Take 100 mg by mouth daily.    . QUEtiapine (SEROQUEL) 25 MG tablet TAKE 1 TO 3 TABLETS BY MOUTH TWICE A DAY AS NEEDED ANXIETY 270 tablet 0  . sertraline (ZOLOFT) 100 MG tablet TAKE 2 TABLETS (200MG )     DAILY 180 tablet 1  . traZODone (DESYREL) 100 MG tablet Take 1 tablet (100 mg total) by mouth at bedtime. 90 tablet 1  . vitamin E 1000 UNIT capsule Take 1,000 Units by mouth daily.     No current facility-administered medications for this visit.    Medication Side Effects: Other: tremor, grinding teeth daytime, blurred vision, mild right arm posturing, sleep 11 hours.  Not much daytime sleepiness.  Allergies:  Allergies  Allergen Reactions  . Metformin And Related   . Naltrexone     No past medical history on file.  No family history on file.  Social History   Socioeconomic History  . Marital status: Legally Separated    Spouse name: Not on file  . Number of children: Not on file  . Years of education: Not on file  . Highest education level: Not on file  Occupational History  . Not on file  Tobacco Use  . Smoking status: Never Smoker  . Smokeless tobacco: Never Used  Substance and Sexual Activity  . Alcohol use: Not on file  . Drug use: Not on file  . Sexual activity: Not on file  Other Topics Concern  . Not on file  Social History Narrative  . Not on file   Social Determinants of Health   Financial Resource Strain:   . Difficulty of Paying Living Expenses:   Food Insecurity:   . Worried About Programme researcher, broadcasting/film/videounning Out of Food in the Last Year:   . Baristaan Out of Food in the Last Year:   Transportation Needs:   . Freight forwarderLack of Transportation (Medical):   [name]. Lack of Transportation (Non-Medical):   Physical Activity:   . Days of Exercise per Week:   . Minutes of Exercise per Session:   Stress:   . Feeling of Stress :   Social Connections:   . Frequency of Communication with Friends and  Family:   . Frequency of Social Gatherings with Friends and Family:   . Attends Religious Services:   . Active Member of Clubs or Organizations:   . Attends BankerClub or Organization Meetings:   [name]. Marital Status:   Intimate Partner Violence:   . Fear of Current or Ex-Partner:   .  Emotionally Abused:   [name] Physically Abused:   . Sexually Abused:     Past Medical History, Surgical history, Social history, and Family history were reviewed and updated as appropriate.   Please see review of systems for further details on the patient's review from today.   Objective:   Physical Exam:  There were no vitals taken for this visit.  Physical Exam Constitutional:      General: She is not in acute distress.    Appearance: She is well-developed.  Neurological:     Mental Status: She is alert and oriented to person, place, and time.     Cranial Nerves: No dysarthria.  Psychiatric:        Attention and Perception: Attention normal. She does not perceive auditory or visual hallucinations.        Mood and Affect: Mood is anxious. Mood is not depressed. Affect is not labile, blunt, angry, tearful or inappropriate.        Speech: Speech normal.        Behavior: Behavior normal. Behavior is cooperative.        Thought Content: Thought content normal. Thought content is not paranoid or delusional. Thought content does not include homicidal or suicidal ideation. Thought content does not include homicidal or suicidal plan.        Cognition and Memory: Cognition and memory normal.        Judgment: Judgment normal.     Comments: Less depression and anxiety but residual. Less AH back on Loxapine.     Lab Review:     Component Value Date/Time   NA 138 09/28/2019 0954   K 4.2 09/28/2019 0954   CL 103 09/28/2019 0954   CO2 23 09/28/2019 0954   GLUCOSE 93 09/28/2019 0954   GLUCOSE 99 10/10/2010 1150   BUN 16 09/28/2019 0954   CREATININE 0.81 09/28/2019 0954   CALCIUM 10.3 (H) 09/28/2019 0954   PROT  7.5 10/10/2010 0207   ALBUMIN 4.1 10/10/2010 0207   AST 23 10/10/2010 0207   ALT 19 10/10/2010 0207   ALKPHOS 40 10/10/2010 0207   BILITOT 0.4 10/10/2010 0207   GFRNONAA 94 09/28/2019 0954   GFRAA 108 09/28/2019 0954       Component Value Date/Time   WBC 16.7 (H) 10/10/2010 0207   RBC 4.50 10/10/2010 0207   HGB 12.7 04/14/2011 0956   HCT 40.0 10/10/2010 0223   PLT 225 10/10/2010 0207   MCV 86.7 10/10/2010 0207   MCH 28.7 10/10/2010 0207   MCHC 33.1 10/10/2010 0207   RDW 12.9 10/10/2010 0207   LYMPHSABS 3.3 10/10/2010 0207   MONOABS 1.7 (H) 10/10/2010 0207   EOSABS 0.4 10/10/2010 0207   BASOSABS 0.1 10/10/2010 0207    Lithium Lvl  Date Value Ref Range Status  09/28/2019 1.0 0.6 - 1.2 mmol/L Final    Comment:                                     Detection Limit = 0.1                           <0.1 indicates None Detected     TSH low on Cytomel in June 2020 No results found for: PHENYTOIN, PHENOBARB, VALPROATE, CBMZ  lithium level on 06/29/2018 was 1.3 which is typical for her.  She needs high normal lithium level for stability.  Her vitamin D level was 70 which is good.  Last lithium level 03/2019 0.89. .res Assessment: Plan:    There are no diagnoses linked to this encounter.  Greater than 50% of 40 minutes face to face time with patient was spent on counseling and coordination of care.  Laura Mathews is chronically unstable and prone to extrapyramidal side effects complicating treatment.  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 Specifically the command hallucinations to kill her self have resolved. Better back on loxapine 300 mg daily.  She is not significantly depressed at this time.  She continues maintenance ECT  Continue Cytomel to 1 1/2 of 25 mcg  tablets each AM.  She's not having palpitations nor anxiety.     Because we are going to have to change the antipsychotic we will not be considering other medication changes today.  continue Wellbutrin to 150 .  Depression is better so far with  The Wellbutrin.  Disc alternative TMS to ECT.  Just she talked to the ECT team in [facility] about this. Continue ECT monthly for a few more months at least until spring.   Extensive discussion of clozapine in detail.   Including the risk of severe neutropenia, marked weight gain, sedation, metabolic problems, cardiac risk, etc.  Discussed the need for weekly blood test for at least 6 months.  However we also discussed this is the most effective antipsychotic on the market.  It matter may better control her voices.  She wishes to defer.  Continue Loxapine 300 mg daily.  Overall she is doing the best on this medication as compared to other antipsychotics.  Most effective option clozapine.  oPTION Fanapt.  She has had less orthostatic hypotension in the last year than she has had in previous years.  She may tolerate this better now and it is much easier to use than clozapine. Keep Caplyta as a back of option.  Her low vitamin D level is supplemented and resolved with supplemental vitamin D.  Low Vitamin D increases the risk of depression  Per [facility] [place], prn for hallucinations bc expects more at the holidays and already on hiogh dose loxapine.  Need one with lower EPS.  Olanzapine SL 10 prn AH.  She took it once and it didn't seem to help  Doctor asked about Accutane.  Disc risk depression and SI. If you have any depression then stop it.  Counseled patient regarding potential benefits, risks, and side effects of lithium to include potential risk of lithium affecting thyroid and renal function.  Discussed need for periodic lab monitoring to determine drug level and to assess for potential adverse effects.  Counseled patient regarding signs and symptoms of  lithium toxicity and advised that they notify office immediately or seek urgent medical attention if experiencing these signs and symptoms.  Patient advised to contact office with any questions or concerns. Disc labs from 09/28/19.  Li 1.0, BMP normal 10.3  Labs stable and no indication for med change.  Follow-up 2-3 mos  Meredith Staggers, MD, DFAPA  Please see After Visit Summary for patient specific instructions.  No future appointments.  No orders of the defined types were placed in this encounter.     -------------------------------

## 2019-11-22 ENCOUNTER — Telehealth: Payer: Self-pay | Admitting: Psychiatry

## 2019-11-22 NOTE — Telephone Encounter (Signed)
Confirmed high dose of Loxapine 50 mg (300 mg) q hs. Patient has been on this dose for awhile, Dr. Jennelle Human aware of high dose and the risks associated with it. Patient is monitored frequently.

## 2019-11-22 NOTE — Telephone Encounter (Signed)
Pharmacist Alayah called inquiring about the high dosage of Loxapine. Please advise.

## 2019-11-29 ENCOUNTER — Encounter: Payer: Self-pay | Admitting: Psychiatry

## 2019-11-29 ENCOUNTER — Telehealth (INDEPENDENT_AMBULATORY_CARE_PROVIDER_SITE_OTHER): Payer: Medicare Other | Admitting: Psychiatry

## 2019-11-29 DIAGNOSIS — R7989 Other specified abnormal findings of blood chemistry: Secondary | ICD-10-CM | POA: Diagnosis not present

## 2019-11-29 DIAGNOSIS — F4001 Agoraphobia with panic disorder: Secondary | ICD-10-CM | POA: Diagnosis not present

## 2019-11-29 DIAGNOSIS — N251 Nephrogenic diabetes insipidus: Secondary | ICD-10-CM

## 2019-11-29 DIAGNOSIS — F411 Generalized anxiety disorder: Secondary | ICD-10-CM

## 2019-11-29 DIAGNOSIS — F250 Schizoaffective disorder, bipolar type: Secondary | ICD-10-CM | POA: Diagnosis not present

## 2019-11-29 DIAGNOSIS — G251 Drug-induced tremor: Secondary | ICD-10-CM

## 2019-11-29 DIAGNOSIS — Z79899 Other long term (current) drug therapy: Secondary | ICD-10-CM

## 2019-11-29 DIAGNOSIS — E559 Vitamin D deficiency, unspecified: Secondary | ICD-10-CM

## 2019-11-29 MED ORDER — LITHIUM CARBONATE ER 300 MG PO TBCR: EXTENDED_RELEASE_TABLET | ORAL | 1 refills | Status: DC

## 2019-11-29 MED ORDER — LAMOTRIGINE 200 MG PO TABS: 200.0000 mg | ORAL_TABLET | Freq: Every day | ORAL | 3 refills | Status: DC

## 2019-11-29 MED ORDER — BUPROPION HCL ER (XL) 150 MG PO TB24: 150.0000 mg | ORAL_TABLET | Freq: Every day | ORAL | 1 refills | Status: DC

## 2019-11-29 NOTE — Progress Notes (Signed)
Alma Mohiuddin [facility] [id number] 1981-06-17 39 y.o.   Virtual Visit via WebEX  I connected with pt by WebEx and verified that I am speaking with the correct person using two identifiers.   I discussed the limitations, risks, security and privacy concerns of performing an evaluation and management service by [place]gina [place] and the availability of in person appointments. I also discussed with the patient that there may be a patient responsible charge related to this service. The patient expressed understanding and agreed to proceed.  I discussed the assessment and treatment plan with the patient. The patient was provided an opportunity to ask questions and all were answered. The patient agreed with the plan and demonstrated an understanding of the instructions.   The patient was advised to call back or seek an in-person evaluation if the symptoms worsen or if the condition fails to improve as anticipated.  I provided 30 minutes of video time during this encounter. The call started at 1000 and ended at 10:30. The patient was located at home and the provider was located office.    Subjective:   Patient ID:  DNIYAH GRANT is a 39 y.o. (DOB May 26, 1981) female.  Chief Complaint:  Chief Complaint  Patient presents with  . Follow-up  . Anxiety  . Depression  . Hallucinations    Medication Refill Associated symptoms include neck pain. Pertinent negatives include no fatigue, headaches or weakness.  Anxiety Symptoms include decreased concentration. Patient reports no confusion, dizziness, nervous/anxious behavior or suicidal ideas.      Josceline Chenard Kamara presents  today for follow-up of schizoaffective depression with hallucinations and anxiety.  When seen August 2020.  We reduced the Cytomel to 37.5 mcg bc of suppressed TSH.  She's noticed no problems.  At visit September 19, 2018.  At that visit we reduced Wellbutrin XL 300 to 150 because of anxiety and recently high blood pressure.     At her visit in April 2020.  She reported no problems from reducing the Wellbutrin and that her anxiety was somewhat better.  There were no changes made at the visit in April.  At visit October 2020.  No meds were changed.  [facility] which is a significant risk for this patient. She will try to access it.  Overall Loxapine has been very good to me but some movement disorder issues with toes moving, teeth grinding, Arm stiffness R, swallow involuntary movements.  seen May 25, 2019.  She remained on loxapine 300 mg.  Under stress she was still having hallucinations and she was given olanzapine 10 mg sublingual as needed auditory hallucinations.  She was continued on Wellbutrin XL 150 mg.  She was continued on Cytomel 37.5 mg every morning.  Also continued lithium 1200 mg daily and lamotrigine 200 mg daily. There was concern about the loxapine shortage and that she might have to be switched to another medication hopefully with low EPS risk such as Fanapt.  But she has a history of orthostatic hypotension and it is unclear if she could tolerate that medication.  seen July 28, 2019.  Because of the shortage of loxapine we had to wean and discontinue that medication.  She was given Caplyta as an alternative antipsychotic given multiple other failures and her tendency to be very sensitive to EPS and tardive dyskinesia. Patient took it for about 3 weeks and then called February 1 stating that she was having nausea and involuntary facial movements and had found loxapine  and wanted to return to it.  Therefore Caplyta was stopped and she was restarted on loxapine to quickly titrate back up to 300 mg nightly the same dose that she is taken in the past.  seen FEB 8 seen with her mother.  Ran out of Caplyta for 3 days bc pharmacy didn't have it.  Just back to 300 mg loxapine as of last night.  Slept fine on Caplyta with less duration needed. Mood pretty  stable and not markedly depressed.  Today feels a ltttle psychotic with voices and feeling people are inside her a little confusion.  Movements from mouth have seemed better.  Has felt a little tight throat that scared her.  Mo notes facial grimacing and lip pursing has almost totally gone.   Mood really well overall with ECT reduced to once monthly. Reports getting "modified bilateral"  ECT.   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 Twin sister had baby boy .  [name] is doing well.  Applying for better job.  Some are in other states.  Not worrying about it.  Together for 13 years.    As of October 02, 2019, Really well overall.   Not manic and "not too psychotic".  Voices down to mostly noise.   Patient reports stable mood and denies depressed or irritable moods.  Anxiety is better and using clonazepam or Seroquel.    Walking more has helped and less depression improved motivation. Patient denies difficulty with sleep initiation or maintenance. Denies appetite disturbance.  Patient reports that energy and motivation have been good.  Patient denies any difficulty with concentration.  Patient denies any suicidal ideation.  No recent panic.   Quiet noise. No med changes.  11/29/19 appt the following noted: Needs trazodone and Seroquel to sleep.  Rare olanzapine for AH.  Still trouble falling asleep with meds and can lay in bed for hours.  Will sleep late to make up for it.   Otherwise well without mania nor depression.  Occ psychotic sx with stressors.  Nephor will leave her on lithium and start amiloride for Nephrogenic Diabetes Insipidus.  Has started and will fu with labs in 2 weeks.Sx polydipsia and polyuria and thirst.  Couple of accidents and 1 in public.  Said renal function was good otherwise.  No SI in a long time.   Tutoring 3 hours weekly.  Enjoys it.   [name] is not planning a job change at this time.  Will  sign lease for apt for another year.  No further near syncope for several mos.   SE overall been OK until lately some neck and back pain ? Related.    Thinks the depression may be helped by the Wellbutrin.   Started Accutane and no SE yet.    ECT still monthly.  Helped memory.  Asks about stretching it further.  However when she went 5-week intervals this last month she noticed more depression during the week preceding the ECT  Psychiatric medication history includes risperidone 3.5 mg EPS,  Abilify 30 mg withdrawal dyskinesia, perphenazine, Geodon, olanzapine, Saphris 5 mg, Haldol, Latuda 160, loxapine 300 mg, Seroquel constipation and U px,   lamotrigine, Depakote,, lithium, clonazepam,  Fetzima, sertraline, Pristiq,, paroxetine,  citalopram, Lexapro, buspirone, Cytomel, topiramate, buspirone,, gabapentin, trazodone propranolol with no response for anxiety ECT maintenance Under our psychiatric care since 2006.  Remote history of OD Klonopin.  Review of Systems:  Review of Systems  Constitutional: Negative for fatigue.  Endocrine: Positive for polyuria.  Genitourinary: Positive for urgency.  Musculoskeletal: Positive for back pain and neck pain.  Neurological: Positive for tremors. Negative for dizziness, weakness and headaches.  Psychiatric/Behavioral: Positive for decreased concentration. Negative for agitation, behavioral problems, confusion, dysphoric mood, hallucinations, self-injury, sleep disturbance and suicidal ideas. The patient is not nervous/anxious and is not hyperactive.     Medications: I have reviewed the patient's current medications.  Current Outpatient Medications  Medication Sig Dispense Refill  . Acetylcysteine (NAC) 600 MG CAPS Take 600 mg by mouth 2 (two) times daily.    [name]. b complex vitamins tablet Take 1 tablet by mouth daily.    . Cholecalciferol (VITAMIN D3) 5000 units CAPS Take 1 capsule by mouth daily.    . clonazePAM (KLONOPIN) 0.5 MG tablet TAKE ONE  TABLET BY MOUTH TWICE DAILY AND TWO TABLETS AT BEDTIME AS NEEDED 100 tablet 0  . liothyronine (CYTOMEL) 25 MCG tablet TAKE 1 AND 1/2 TABLETS     EVERY MORNING 135 tablet 0  . loxapine (LOXITANE) 50 MG capsule TAKE 6 CAPSULES (300MG ) AT BEDTIME 540 capsule 0  . Melatonin 10 MG CAPS Take 10 mg by mouth.    . Multiple Vitamins-Minerals (MULTIVITAMIN ADULT) CHEW Chew by mouth.    . Omega-3 Fatty Acids (FISH OIL) 1200 MG CAPS Take by mouth.    . pyridOXINE (VITAMIN B-6) 100 MG tablet Take 100 mg by mouth daily.    . QUEtiapine (SEROQUEL) 25 MG tablet TAKE 1 TO 3 TABLETS BY MOUTH TWICE A DAY AS NEEDED ANXIETY 270 tablet 0  . sertraline (ZOLOFT) 100 MG tablet TAKE 2 TABLETS (200MG )     DAILY 180 tablet 1  . traZODone (DESYREL) 100 MG tablet Take 1 tablet (100 mg total) by mouth at bedtime. 90 tablet 1  . vitamin E 1000 UNIT capsule Take 1,000 Units by mouth daily.    [name]. buPROPion (WELLBUTRIN XL) 150 MG 24 hr tablet Take 1 tablet (150 mg total) by mouth daily. 90 tablet 1  . fluticasone (FLONASE) 50 MCG/ACT nasal spray 1 spray by Each Nare route daily.    [name]. lamoTRIgine (LAMICTAL) 200 MG tablet Take 1 tablet (200 mg total) by mouth daily. 90 tablet 3  . lithium carbonate (LITHOBID) 300 MG CR tablet TAKE 2 TABLETS (600MG ) TWO TIMES A DAY 360 tablet 1  . OLANZapine zydis (ZYPREXA) 10 MG disintegrating tablet PLACE 1 TABLET (10 MG TOTAL) UNDER THE TONGUE DAILY AS NEEDED. (Patient not taking: Reported on 11/29/2019) 90 tablet 1   No current facility-administered medications for this visit.    Medication Side Effects: Other: tremor, grinding teeth daytime, blurred vision, mild right arm posturing, sleep 11 hours.  Not much daytime sleepiness.  Allergies:  Allergies  Allergen Reactions  . Metformin And Related   . Naltrexone     History reviewed. No pertinent past medical history.  History reviewed. No pertinent family history.  Social History   Socioeconomic History  . Marital status: Legally  Separated    Spouse name: Not on file  . Number of children: Not on file  . Years of education: Not on file  . Highest education level: Not on file  Occupational History  . Not on file  Tobacco Use  . Smoking status: Never Smoker  . Smokeless tobacco: Never Used  Substance and Sexual Activity  . Alcohol use: Not on file  . Drug use: Not on file  . Sexual activity:  Not on file  Other Topics Concern  . Not on file  Social History Narrative  . Not on file   Social Determinants of Health   Financial Resource Strain:   . Difficulty of Paying Living Expenses:   Food Insecurity:   . Worried About Charity fundraiser in the Last Year:   . Arboriculturist in the Last Year:   Transportation Needs:   . Film/video editor (Medical):   [name] Lack of Transportation (Non-Medical):   Physical Activity:   . Days of Exercise per Week:   . Minutes of Exercise per Session:   Stress:   . Feeling of Stress :   Social Connections:   . Frequency of Communication with Friends and Family:   . Frequency of Social Gatherings with Friends and Family:   . Attends Religious Services:   . Active Member of Clubs or Organizations:   . Attends Archivist Meetings:   [name] Marital Status:   Intimate Partner Violence:   . Fear of Current or Ex-Partner:   . Emotionally Abused:   [name] Physically Abused:   . Sexually Abused:     Past Medical History, Surgical history, Social history, and Family history were reviewed and updated as appropriate.   Please see review of systems for further details on the patient's review from today.   Objective:   Physical Exam:  There were no vitals taken for this visit.  Physical Exam Constitutional:      General: She is not in acute distress.    Appearance: She is well-developed.  Neurological:     Mental Status: She is alert and oriented to person, place, and time.     Cranial Nerves: No dysarthria.  Psychiatric:        Attention and Perception: Attention  normal. She does not perceive auditory or visual hallucinations.        Mood and Affect: Mood is anxious. Mood is not depressed. Affect is not labile, blunt, angry, tearful or inappropriate.        Speech: Speech normal.        Behavior: Behavior normal. Behavior is cooperative.        Thought Content: Thought content normal. Thought content is not paranoid or delusional. Thought content does not include homicidal or suicidal ideation. Thought content does not include homicidal or suicidal plan.        Cognition and Memory: Cognition and memory normal.        Judgment: Judgment normal.     Comments: Less depression and anxiety but residual. Less AH back on Loxapine.     Lab Review:     Component Value Date/Time   NA 138 09/28/2019 0954   K 4.2 09/28/2019 0954   CL 103 09/28/2019 0954   CO2 23 09/28/2019 0954   GLUCOSE 93 09/28/2019 0954   GLUCOSE 99 10/10/2010 1150   BUN 16 09/28/2019 0954   CREATININE 0.81 09/28/2019 0954   CALCIUM 10.3 (H) 09/28/2019 0954   PROT 7.5 10/10/2010 0207   ALBUMIN 4.1 10/10/2010 0207   AST 23 10/10/2010 0207   ALT 19 10/10/2010 0207   ALKPHOS 40 10/10/2010 0207   BILITOT 0.4 10/10/2010 0207   GFRNONAA 94 09/28/2019 0954   GFRAA 108 09/28/2019 0954       Component Value Date/Time   WBC 16.7 (H) 10/10/2010 0207   RBC 4.50 10/10/2010 0207   HGB 12.7 04/14/2011 0956   HCT 40.0 10/10/2010 0223   PLT  225 10/10/2010 0207   MCV 86.7 10/10/2010 0207   MCH 28.7 10/10/2010 0207   MCHC 33.1 10/10/2010 0207   RDW 12.9 10/10/2010 0207   LYMPHSABS 3.3 10/10/2010 0207   MONOABS 1.7 (H) 10/10/2010 0207   EOSABS 0.4 10/10/2010 0207   BASOSABS 0.1 10/10/2010 0207    Lithium Lvl  Date Value Ref Range Status  09/28/2019 1.0 0.6 - 1.2 mmol/L Final    Comment:                                     Detection Limit = 0.1                           <0.1 indicates None Detected     TSH low on Cytomel in June 2020 No results found for: PHENYTOIN, PHENOBARB,  VALPROATE, CBMZ  lithium level on 06/29/2018 was 1.3 which is typical for her.  She needs high normal lithium level for stability.  Her vitamin D level was 70 which is good.  Last lithium level 03/2019 0.89. .res Assessment: Plan:    Shany was seen today for follow-up, anxiety, depression and hallucinations.  Diagnoses and all orders for this visit:  Schizoaffective disorder, bipolar type (HCC) -     lithium carbonate (LITHOBID) 300 MG CR tablet; TAKE 2 TABLETS (600MG ) TWO TIMES A DAY -     buPROPion (WELLBUTRIN XL) 150 MG 24 hr tablet; Take 1 tablet (150 mg total) by mouth daily. -     lamoTRIgine (LAMICTAL) 200 MG tablet; Take 1 tablet (200 mg total) by mouth daily.  Generalized anxiety disorder  Panic disorder with agoraphobia  Low serum vitamin D  Lithium use  Lithium-induced tremor  Vitamin D deficiency disease  Nephrogenic diabetes insipidus (HCC)    Greater than 50% of 40 minutes face to face time with patient was spent on counseling and coordination of care.  Ascencion is chronically unstable and prone to extrapyramidal side effects complicating treatment.  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 Specifically the command hallucinations to kill her self have resolved. Better back on loxapine 300 mg daily.  She is not significantly depressed at this time.  She continues maintenance ECT  Continue Cytomel to 1 1/2 of 25 mcg tablets each AM.  She's not having palpitations nor anxiety.     Because we are going to have to change the antipsychotic we will not be considering other medication changes today.  continue Wellbutrin to 150 .  Depression is better so far with  The Wellbutrin.  Disc alternative TMS to ECT.  Just she talked to the ECT team in  [facility] about this. Continue ECT monthly for a few more months at least until spring.   Extensive discussion of clozapine in detail.   Including the risk of severe neutropenia, marked weight gain, sedation, metabolic problems, cardiac risk, etc.  Discussed the need for weekly blood test for at least 6 months.  However we also discussed this is the most effective antipsychotic on the market.  It matter may better control her voices.  She wishes to defer.  Continue Loxapine 300 mg daily.  Overall she is doing the best on this medication as compared  to other antipsychotics.  No evidence of EPS/TD of significance  Most effective option clozapine.  oPTION Fanapt.  She has had less orthostatic hypotension in the last year than she has had in previous years.  She may tolerate this better now and it is much easier to use than clozapine. Keep Caplyta as a back of option.  Her low vitamin D level is supplemented and resolved with supplemental vitamin D.  Low Vitamin D increases the risk of depression  Per [facility] [place], prn for hallucinations bc expects more at the holidays and already on hiogh dose loxapine.  Need one with lower EPS.  Olanzapine SL 10 prn AH.  She took it once and it didn't seem to help  Doctor asked about Accutane.  Disc risk depression and SI. If you have any depression then stop it.  Counseled patient regarding potential benefits, risks, and side effects of lithium to include potential risk of lithium affecting thyroid and renal function.  Discussed need for periodic lab monitoring to determine drug level and to assess for potential adverse effects.  Counseled patient regarding signs and symptoms of lithium toxicity and advised that they notify office immediately or seek urgent medical attention if experiencing these signs and symptoms.  Patient advised to contact office with any questions or concerns. Disc labs from 09/28/19.  Li 1.0, BMP normal 10.3  Labs stable and no indication for  med change.  Follow-up 2-3 mos  Meredith Staggers, MD, DFAPA  Please see After Visit Summary for patient specific instructions.  No future appointments.  No orders of the defined types were placed in this encounter.     -------------------------------

## 2019-12-12 ENCOUNTER — Other Ambulatory Visit: Payer: Self-pay | Admitting: Psychiatry

## 2019-12-18 ENCOUNTER — Other Ambulatory Visit: Payer: Self-pay | Admitting: Psychiatry

## 2019-12-29 ENCOUNTER — Telehealth: Payer: Self-pay | Admitting: Psychiatry

## 2019-12-29 NOTE — Telephone Encounter (Signed)
She's aware. 

## 2019-12-29 NOTE — Telephone Encounter (Signed)
About 2000-2500 mg of fishoil daily

## 2019-12-29 NOTE — Telephone Encounter (Signed)
Laura Mathews is taking fish oil for memory and mood.  She is wondering how much she should take.  Please call.

## 2020-01-24 ENCOUNTER — Telehealth: Payer: Self-pay | Admitting: Psychiatry

## 2020-01-24 ENCOUNTER — Other Ambulatory Visit: Payer: Self-pay

## 2020-01-24 MED ORDER — OLANZAPINE 10 MG PO TBDP: ORAL_TABLET | ORAL | 1 refills | Status: DC

## 2020-01-24 MED ORDER — QUETIAPINE FUMARATE 50 MG PO TABS: ORAL_TABLET | ORAL | 1 refills | Status: DC

## 2020-01-24 NOTE — Telephone Encounter (Signed)
Pt stated she hasn't slept in 7 days. She is starting to feel strange ,and is inquiring if there is something she can take. Please advise.

## 2020-01-24 NOTE — Telephone Encounter (Signed)
Can either increase olanzapine to 20 mg from 10 mg HS and take it 90 mins before sleep or use quetiapine 50-100 mg HS with olanzapine 10 mg HS. Loxapine is at highest dose.

## 2020-01-24 NOTE — Telephone Encounter (Signed)
Rtc to patient and she decided to take the quetiapine 50-100 mg at hs and the olanzapine 10 mg to help with sleep. She said normally she sleeps well but the last week has been terrible. Nothing has happened that would have caused this she said. Instructed to call back if still not helping.

## 2020-01-25 NOTE — Telephone Encounter (Signed)
Dr. Patsey Berthold from [facility] called and wants to share some information on pt. Please call [phone].

## 2020-01-30 NOTE — Telephone Encounter (Signed)
Spoke with Dr. Einar Pheasant who is concerned with her nephrogenic diabetes insipidus and amount of water she's consuming and calcium levels.  All likely related to lithium.  As pt is having maintenance ECT she may tolerate a lower dose of lithium and maintain stability.  Currently lithium levels stable at about 1.0. Dr. Einar Pheasant will continue to try to increase amiloride to treat her DI. Have pt reduce lithium to 1 and 1/2 of Lithobid tablets in the morning and 2 tablets at night.

## 2020-01-30 NOTE — Telephone Encounter (Signed)
Patient aware of instructions and to call back with any concerns or questions.

## 2020-02-09 ENCOUNTER — Other Ambulatory Visit: Payer: Self-pay | Admitting: Psychiatry

## 2020-02-09 DIAGNOSIS — F250 Schizoaffective disorder, bipolar type: Secondary | ICD-10-CM

## 2020-02-15 ENCOUNTER — Other Ambulatory Visit: Payer: Self-pay | Admitting: Psychiatry

## 2020-02-20 ENCOUNTER — Other Ambulatory Visit: Payer: Self-pay | Admitting: Psychiatry

## 2020-02-20 DIAGNOSIS — F250 Schizoaffective disorder, bipolar type: Secondary | ICD-10-CM

## 2020-02-21 ENCOUNTER — Telehealth: Payer: Self-pay | Admitting: Psychiatry

## 2020-02-21 ENCOUNTER — Other Ambulatory Visit: Payer: Self-pay

## 2020-02-21 MED ORDER — LIOTHYRONINE SODIUM 25 MCG PO TABS: ORAL_TABLET | ORAL | 0 refills | Status: DC

## 2020-02-21 NOTE — Telephone Encounter (Signed)
Pt also needs [facility] [place] [facility] [address]

## 2020-02-26 ENCOUNTER — Ambulatory Visit: Payer: Medicare Other | Admitting: Psychiatry

## 2020-02-26 NOTE — Telephone Encounter (Signed)
Prior authorization submitted and approved for Quantity limit for CLONAZEPAM 0.5 mg #10/30 day effective 11/24/2019-07/19/2020 with [facility] with [employer].

## 2020-03-04 ENCOUNTER — Telehealth: Payer: Self-pay | Admitting: Psychiatry

## 2020-03-04 NOTE — Telephone Encounter (Signed)
Laura Mathews called to report that you had decrease her lithium dose and it caused her to become depressed so she went back to the old dose.  She is feeling better mentally, but physically is not feeling well.  Please call to let her know what to do.  [phone]

## 2020-03-04 NOTE — Telephone Encounter (Signed)
Spoke with Dr. Einar Pheasant, nephrologist, who is concerned with her nephrogenic diabetes insipidus and amount of water she's consuming and calcium levels.  All likely related to lithium.  As pt is having maintenance ECT she may tolerate a lower dose of lithium and maintain stability.  Currently lithium levels stable at about 1.0. Dr. Einar Pheasant will continue to try to increase amiloride to treat her DI. Have pt reduce lithium to 1 and 1/2 of Lithobid tablets in the morning and 2 tablets at night.  She needs to reduce the lithium by the slight amount I said in the prior note repeated above and give it more time to adjust.  Don't increase the lithium on her own.  It's potentially dangerous.

## 2020-03-05 NOTE — Telephone Encounter (Signed)
Left detailed message with instructions to decrease lithium as previously recommended. Patient has apt 08/23, Monday and call back if needing anything before then.

## 2020-03-11 ENCOUNTER — Ambulatory Visit: Payer: Medicare Other | Admitting: Psychiatry

## 2020-03-21 ENCOUNTER — Telehealth: Payer: Self-pay | Admitting: Psychiatry

## 2020-03-21 NOTE — Telephone Encounter (Signed)
Laura Mathews called because she called [facility] to get her refills of her medications and they told her they are all on hold and that the dr needed to resent the prescriptions.  This is all her medications.  Zoloft and Trazadone were sent 02/12/20 and she said she hasn't received any medications in some time.  She didn't think it was because it was too early to fill, but she needs them to be processed and sent to her.  Her Seroquel was sent 02/15/30 and all others were sent 02/21/20.  Please call to find out why they won't fill her medications.

## 2020-03-21 NOTE — Telephone Encounter (Signed)
Spoke with [facility] and patient should have received all her medications according to their records except for Trazodone. States it needs a prior authorization but that is not the case. Will contact insurance to see what the issue is.  Representative said for patient to contact member services if she's not received what's been mailed to her.

## 2020-03-22 ENCOUNTER — Other Ambulatory Visit: Payer: Self-pay

## 2020-03-22 MED ORDER — TRAZODONE HCL 100 MG PO TABS: 100.0000 mg | ORAL_TABLET | Freq: Every day | ORAL | 1 refills | Status: DC

## 2020-03-22 NOTE — Telephone Encounter (Signed)
Contacted patient today and she said she did find all her Rx's from [facility]. She does need trazodone sent locally. 30 day supply of [facility]

## 2020-03-26 NOTE — Telephone Encounter (Signed)
Grover Canavan, please follow up with patient and see if she was able to get her trazodone.

## 2020-03-27 NOTE — Telephone Encounter (Signed)
Awesome! Thank you!

## 2020-03-27 NOTE — Telephone Encounter (Signed)
Spoke with her. Patient was able to get her Trazodone.

## 2020-03-29 ENCOUNTER — Telehealth: Payer: Self-pay | Admitting: Psychiatry

## 2020-03-29 ENCOUNTER — Other Ambulatory Visit: Payer: Self-pay | Admitting: Psychiatry

## 2020-03-29 MED ORDER — TRAZODONE HCL 100 MG PO TABS: 100.0000 mg | ORAL_TABLET | Freq: Every day | ORAL | 0 refills | Status: DC

## 2020-03-29 NOTE — Telephone Encounter (Signed)
Pt called and needs a refill for her Trazadone. She needs a 3 month supply. Send to [facility]. [phone]

## 2020-05-01 ENCOUNTER — Other Ambulatory Visit: Payer: Self-pay

## 2020-05-01 DIAGNOSIS — F250 Schizoaffective disorder, bipolar type: Secondary | ICD-10-CM

## 2020-05-01 MED ORDER — LITHIUM CARBONATE ER 300 MG PO TBCR: EXTENDED_RELEASE_TABLET | ORAL | 1 refills | Status: DC

## 2020-05-07 ENCOUNTER — Telehealth (INDEPENDENT_AMBULATORY_CARE_PROVIDER_SITE_OTHER): Payer: Medicare Other | Admitting: Psychiatry

## 2020-05-07 ENCOUNTER — Encounter: Payer: Self-pay | Admitting: Psychiatry

## 2020-05-07 DIAGNOSIS — F250 Schizoaffective disorder, bipolar type: Secondary | ICD-10-CM

## 2020-05-07 DIAGNOSIS — F4001 Agoraphobia with panic disorder: Secondary | ICD-10-CM

## 2020-05-07 DIAGNOSIS — T50905A Adverse effect of unspecified drugs, medicaments and biological substances, initial encounter: Secondary | ICD-10-CM

## 2020-05-07 DIAGNOSIS — F411 Generalized anxiety disorder: Secondary | ICD-10-CM

## 2020-05-07 DIAGNOSIS — F5105 Insomnia due to other mental disorder: Secondary | ICD-10-CM

## 2020-05-07 DIAGNOSIS — G2589 Other specified extrapyramidal and movement disorders: Secondary | ICD-10-CM | POA: Diagnosis not present

## 2020-05-07 MED ORDER — BUPROPION HCL ER (XL) 150 MG PO TB24: 150.0000 mg | ORAL_TABLET | Freq: Every day | ORAL | 1 refills | Status: DC

## 2020-05-07 MED ORDER — CLONAZEPAM 0.5 MG PO TABS: ORAL_TABLET | ORAL | 2 refills | Status: DC

## 2020-05-07 MED ORDER — QUETIAPINE FUMARATE 50 MG PO TABS: ORAL_TABLET | ORAL | 0 refills | Status: DC

## 2020-05-07 MED ORDER — TRAZODONE HCL 100 MG PO TABS: 100.0000 mg | ORAL_TABLET | Freq: Every day | ORAL | 0 refills | Status: DC

## 2020-05-07 MED ORDER — LIOTHYRONINE SODIUM 25 MCG PO TABS: ORAL_TABLET | ORAL | 0 refills | Status: DC

## 2020-05-07 MED ORDER — SERTRALINE HCL 100 MG PO TABS: 200.0000 mg | ORAL_TABLET | Freq: Every day | ORAL | 1 refills | Status: DC

## 2020-05-07 MED ORDER — BENZTROPINE MESYLATE 0.5 MG PO TABS: ORAL_TABLET | ORAL | 0 refills | Status: DC

## 2020-05-07 MED ORDER — LOXAPINE SUCCINATE 50 MG PO CAPS: 300.0000 mg | ORAL_CAPSULE | Freq: Every day | ORAL | 1 refills | Status: DC

## 2020-05-07 NOTE — Progress Notes (Signed)
[facility] [id number] 06-05-1981 39 y.o.   Virtual Visit via [place]  I connected with pt by WebEx and verified that I am speaking with the correct person using two identifiers.   I discussed the limitations, risks, security and privacy concerns of performing an evaluation and management service by Jackquline Denmark and the availability of in person appointments. I also discussed with the patient that there may be a patient responsible charge related to this service. The patient expressed understanding and agreed to proceed.  I discussed the assessment and treatment plan with the patient. The patient was provided an opportunity to ask questions and all were answered. The patient agreed with the plan and demonstrated an understanding of the instructions.   The patient was advised to call back or seek an in-person evaluation if the symptoms worsen or if the condition fails to improve as anticipated.  I provided 30 minutes of video time during this encounter. The call started at 1000 and ended at 10:45. The patient was located at home and the provider was located office.    Subjective:   Patient ID:  NIGEL WESSMAN is a 39 y.o. (DOB 1980/11/06) female.  Chief Complaint:  Chief Complaint  Patient presents with  . Anxiety  . Depression  . Hallucinations  . Follow-up    Medication Refill Associated symptoms include myalgias and neck pain. Pertinent negatives include no fatigue, headaches or weakness.  Anxiety Symptoms include decreased concentration. Patient reports no confusion, dizziness, nervous/anxious behavior or suicidal ideas.      Lekeya Rollings Heuring presents  today for follow-up of schizoaffective depression with hallucinations and anxiety.  When seen August 2020.  We reduced the Cytomel to 37.5 mcg bc of suppressed TSH.  She's noticed no problems.  At visit September 19, 2018.  At that visit we reduced Wellbutrin XL 300 to 150 because of anxiety and recently high  blood pressure.    At her visit in April 2020.  She reported no problems from reducing the Wellbutrin and that her anxiety was somewhat better.  There were no changes made at the visit in April.  At visit October 2020.  No meds were changed.  Have gotten notice about [facility] not having access to loxapine which is a significant risk for this patient. She will try to access it.  Overall Loxapine has been very good to me but some movement disorder issues with toes moving, teeth grinding, Arm stiffness R, swallow involuntary movements.  seen May 25, 2019.  She remained on loxapine 300 mg.  Under stress she was still having hallucinations and she was given olanzapine 10 mg sublingual as needed auditory hallucinations.  She was continued on Wellbutrin XL 150 mg.  She was continued on Cytomel 37.5 mg every morning.  Also continued lithium 1200 mg daily and lamotrigine 200 mg daily. There was concern about the loxapine shortage and that she might have to be switched to another medication hopefully with low EPS risk such as Fanapt.  But she has a history of orthostatic hypotension and it is unclear if she could tolerate that medication.  seen July 28, 2019.  Because of the shortage of loxapine we had to wean and discontinue that medication.  She was given Caplyta as an alternative antipsychotic given multiple other failures and her tendency to be very sensitive to EPS and tardive dyskinesia. Patient took it for about 3 weeks and then called February 1 stating that she was having nausea and involuntary facial movements and had  found loxapine and wanted to return to it.  Therefore Caplyta was stopped and she was restarted on loxapine to quickly titrate back up to 300 mg nightly the same dose that she is taken in the past.  seen FEB 8 seen with her mother.  Ran out of [place] for 3 days bc pharmacy didn't have it.  Just back to 300 mg loxapine as of last night.  Slept fine on Caplyta with less duration  needed. Mood pretty stable and not markedly depressed.  Today feels a ltttle psychotic with voices and feeling people are inside her a little confusion.  Movements from mouth have seemed better.  Has felt a little tight throat that scared her.  Mo notes facial grimacing and lip pursing has almost totally gone.   Mood really well overall with ECT reduced to once monthly. Reports getting "modified bilateral"  ECT.   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 Twin sister had baby boy .  [name] is doing well.  Applying for better job.  Some are in other states.  Not worrying about it.  Together for 13 years.    As of October 02, 2019, Really well overall.   Not manic and "not too psychotic".  Voices down to mostly noise.   Patient reports stable mood and denies depressed or irritable moods.  Anxiety is better and using clonazepam or Seroquel.    Walking more has helped and less depression improved motivation. Patient denies difficulty with sleep initiation or maintenance. Denies appetite disturbance.  Patient reports that energy and motivation have been good.  Patient denies any difficulty with concentration.  Patient denies any suicidal ideation.  No recent panic.   Quiet noise. No med changes.  11/29/19 appt the following noted: Needs trazodone and Seroquel to sleep.  Rare olanzapine for AH.  Still trouble falling asleep with meds and can lay in bed for hours.  Will sleep late to make up for it.   Otherwise well without mania nor depression.  Occ psychotic sx with stressors.  Nephro will leave her on lithium and start amiloride for Nephrogenic Diabetes Insipidus.  Has started and will fu with labs in 2 weeks.Sx polydipsia and polyuria and thirst.  Couple of accidents and 1 in public.  Said renal function was good otherwise.  No SI in a long time.   Tutoring 3 hours weekly.  Enjoys it.   [name] is not planning a job change at  this time.  Will sign lease for apt for another year. Plan: no med changes  05/07/2020 appointment with the following noted: Continued all meds.  Still doing maintenance ECT monthly. No olanzapine lately.  Only twice since had it.   For the most part don't hear voices unless under severe stress. Mood happy without depression. Doing deep breathing from DBT to help with anxiety.  I'm still an anxious person. 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   CO weight gain since here and thinks it's from using the Seroquel 50-100 mg  for sleep and now trying to use less. Sleep well with Seroquel and Klonopin.  SE EPS bc walking with arms bent and this is leading to some pain R >L.  Can straighten it out.  No further near syncope for several mos.   SE overall been OK until lately some neck and back pain ? Related.    Thinks the  depression may be helped by the Wellbutrin.   Started Accutane and no SE yet.    ECT still monthly.  Helped memory.  Asks about stretching it further.  However when she went 5-week intervals this last month she noticed more depression during the week preceding the ECT  Psychiatric medication history includes risperidone 3.5 mg EPS,  Abilify 30 mg withdrawal dyskinesia, perphenazine, Geodon, olanzapine, Saphris 5 mg, Haldol, Latuda 160, loxapine 300 mg, Seroquel constipation and U px,   lamotrigine, Depakote, lithium, clonazepam,  Fetzima, sertraline, Pristiq,, paroxetine,  citalopram, Lexapro,  buspirone, Cytomel, topiramate, buspirone,, gabapentin, trazodone propranolol with no response for anxiety ECT maintenance Under our psychiatric care since 2006.  Remote history of OD Klonopin.  Review of Systems:  Review of Systems  Constitutional: Positive for unexpected weight change. Negative for fatigue.  Endocrine: Positive for polyuria.  Genitourinary: Positive for urgency.  Musculoskeletal: Positive for back pain, myalgias and neck pain.  Neurological:  Positive for tremors. Negative for dizziness, weakness and headaches.  Psychiatric/Behavioral: Positive for decreased concentration. Negative for agitation, behavioral problems, confusion, dysphoric mood, hallucinations, self-injury, sleep disturbance and suicidal ideas. The patient is not nervous/anxious and is not hyperactive.     Medications: I have reviewed the patient's current medications.  Current Outpatient Medications  Medication Sig Dispense Refill  . Acetylcysteine (NAC) 600 MG CAPS Take 600 mg by mouth 2 (two) times daily.    [name] b complex vitamins tablet Take 1 tablet by mouth daily.    . Cholecalciferol (VITAMIN D3) 5000 units CAPS Take 1 capsule by mouth daily.    [name] lamoTRIgine (LAMICTAL) 200 MG tablet Take 1 tablet (200 mg total) by mouth daily. 90 tablet 3  . lithium carbonate (LITHOBID) 300 MG CR tablet TAKE 1.5 TABLETS IN THE MORNING, AND 2 TABLETS IN THE PM 315 tablet 1  . Melatonin 10 MG CAPS Take 10 mg by mouth.    . Multiple Vitamins-Minerals (MULTIVITAMIN ADULT) CHEW Chew by mouth.    . OLANZapine zydis (ZYPREXA) 10 MG disintegrating tablet Place 1 tablet (10 mg) under the tongue, 90 minutes before bedtime 30 tablet 1  . Omega-3 Fatty Acids (FISH OIL) 1200 MG CAPS Take by mouth.    . pyridOXINE (VITAMIN B-6) 100 MG tablet Take 100 mg by mouth daily.    . vitamin E 1000 UNIT capsule Take 1,000 Units by mouth daily.    . benztropine (COGENTIN) 0.5 MG tablet 1-2 twice daily as needed for EPS 180 tablet 0  . buPROPion (WELLBUTRIN XL) 150 MG 24 hr tablet Take 1 tablet (150 mg total) by mouth daily. 90 tablet 1  . clonazePAM (KLONOPIN) 0.5 MG tablet TAKE 1 TABLET BY MOUTH TWICE DAILY, AND TAKE 2 TABLETS AT BEDTIME AS NEEDED 100 tablet 2  . fluticasone (FLONASE) 50 MCG/ACT nasal spray 1 spray by Each Nare route daily.    [name] liothyronine (CYTOMEL) 25 MCG tablet TAKE 1 AND 1/2 TABLETS  EVERY MORNING 135 tablet 0  . loxapine (LOXITANE) 50 MG capsule Take 6 capsules (300 mg total)  by mouth at bedtime. 540 capsule 1  . QUEtiapine (SEROQUEL) 50 MG tablet 1-2 tablets at night for sleep 180 tablet 0  . sertraline (ZOLOFT) 100 MG tablet Take 2 tablets (200 mg total) by mouth daily. 180 tablet 1  . traZODone (DESYREL) 100 MG tablet Take 1 tablet (100 mg total) by mouth at bedtime. 90 tablet 0   No current facility-administered medications for this visit.    Medication Side Effects:  Other: tremor, grinding teeth daytime, blurred vision, mild right arm posturing, sleep 11 hours.  Not much daytime sleepiness.  Allergies:  Allergies  Allergen Reactions  . Metformin And Related   . Naltrexone     History reviewed. No pertinent past medical history.  History reviewed. No pertinent family history.  Social History   Socioeconomic History  . Marital status: Legally Separated    Spouse name: Not on file  . Number of children: Not on file  . Years of education: Not on file  . Highest education level: Not on file  Occupational History  . Not on file  Tobacco Use  . Smoking status: Never Smoker  . Smokeless tobacco: Never Used  Substance and Sexual Activity  . Alcohol use: Not on file  . Drug use: Not on file  . Sexual activity: Not on file  Other Topics Concern  . Not on file  Social History Narrative  . Not on file   Social Determinants of Health   Financial Resource Strain:   . Difficulty of Paying Living Expenses: Not on file  Food Insecurity:   . Worried About Charity fundraiser in the Last Year: Not on file  . Ran Out of Food in the Last Year: Not on file  Transportation Needs:   . Lack of Transportation (Medical): Not on file  . Lack of Transportation (Non-Medical): Not on file  Physical Activity:   . Days of Exercise per Week: Not on file  . Minutes of Exercise per Session: Not on file  Stress:   . Feeling of Stress : Not on file  Social Connections:   . Frequency of Communication with Friends and Family: Not on file  . Frequency of Social  Gatherings with Friends and Family: Not on file  . Attends Religious Services: Not on file  . Active Member of Clubs or Organizations: Not on file  . Attends Archivist Meetings: Not on file  . Marital Status: Not on file  Intimate Partner Violence:   . Fear of Current or Ex-Partner: Not on file  . Emotionally Abused: Not on file  . Physically Abused: Not on file  . Sexually Abused: Not on file    Past Medical History, Surgical history, Social history, and Family history were reviewed and updated as appropriate.   Please see review of systems for further details on the patient's review from today.   Objective:   Physical Exam:  There were no vitals taken for this visit.  Physical Exam Constitutional:      General: She is not in acute distress.    Appearance: She is well-developed.  Neurological:     Mental Status: She is alert and oriented to person, place, and time.     Cranial Nerves: No dysarthria.  Psychiatric:        Attention and Perception: Attention normal. She does not perceive auditory or visual hallucinations.        Mood and Affect: Mood is anxious. Mood is not depressed. Affect is not labile, blunt, angry, tearful or inappropriate.        Speech: Speech normal. Speech is not slurred.        Behavior: Behavior normal. Behavior is cooperative.        Thought Content: Thought content normal. Thought content is not paranoid or delusional. Thought content does not include homicidal or suicidal ideation. Thought content does not include homicidal or suicidal plan.        Cognition and  Memory: Cognition and memory normal.        Judgment: Judgment normal.     Comments: Less depression and anxiety but residual. Less AH back on Loxapine.     Lab Review:     Component Value Date/Time   NA 138 09/28/2019 0954   K 4.2 09/28/2019 0954   CL 103 09/28/2019 0954   CO2 23 09/28/2019 0954   GLUCOSE 93 09/28/2019 0954   GLUCOSE 99 10/10/2010 1150   BUN 16  09/28/2019 0954   CREATININE 0.81 09/28/2019 0954   CALCIUM 10.3 (H) 09/28/2019 0954   PROT 7.5 10/10/2010 0207   ALBUMIN 4.1 10/10/2010 0207   AST 23 10/10/2010 0207   ALT 19 10/10/2010 0207   ALKPHOS 40 10/10/2010 0207   BILITOT 0.4 10/10/2010 0207   GFRNONAA 94 09/28/2019 0954   GFRAA 108 09/28/2019 0954       Component Value Date/Time   WBC 16.7 (H) 10/10/2010 0207   RBC 4.50 10/10/2010 0207   HGB 12.7 04/14/2011 0956   HCT 40.0 10/10/2010 0223   PLT 225 10/10/2010 0207   MCV 86.7 10/10/2010 0207   MCH 28.7 10/10/2010 0207   MCHC 33.1 10/10/2010 0207   RDW 12.9 10/10/2010 0207   LYMPHSABS 3.3 10/10/2010 0207   MONOABS 1.7 (H) 10/10/2010 0207   EOSABS 0.4 10/10/2010 0207   BASOSABS 0.1 10/10/2010 0207    Lithium Lvl  Date Value Ref Range Status  09/28/2019 1.0 0.6 - 1.2 mmol/L Final    Comment:                                     Detection Limit = 0.1                           <0.1 indicates None Detected     TSH low on Cytomel in June 2020 No results found for: PHENYTOIN, PHENOBARB, VALPROATE, CBMZ  lithium level on 06/29/2018 was 1.3 which is typical for her.  She needs high normal lithium level for stability.  Her vitamin D level was 70 which is good.  Last lithium level 03/2019 0.89.  Component 04/17/20 03/06/20 02/13/20 01/09/20 12/25/19 12/07/19  Lithium Lvl 1.2 1.2 1.4High 1.1 1.0 1.0   Component 04/17/20 03/06/20 02/13/20 01/09/20 12/25/19 12/07/19  Sodium 130Low 135 137 136 135 137  Potassium 4.3 4.1 4.0 4.2 4.1 4.1  Chloride 103 105 105 104 105 105  Anion Gap 4Low _0 CO2 23.0 24.9 26.1 27.2 23.5 26.7  BUN 7Low _1 Creatinine 0.88 0.84High 0.80 0.82High 0.72 0.77  BUN/Creatinine Ratio _2 EGFR CKD-EPI Non-African American, Female 84 89 >90 >90 >90 >90  EGFR CKD-EPI African American, Female >90 >90 >90 >90 >90 >90  Glucose 91 99 89 112 95 98  Calcium 10.3 10.9High 10.8High 11.1High 10.9High  10.7High  Albumin 4.5 4.6 4.6 4.1 4.3 4.4  Total Protein 7.6 -- 7.9 -- -- --  Total Bilirubin 0.5 -- 0.5 -- -- --  AST 19 -- 19 -- -- --  ALT 19 -- 18 -- -- --  Alkaline Phosphatase 67 -- 71 -- -- --   Component 04/17/20 02/13/20 01/09/20 12/25/19 12/07/19 11/01/19  Osmolality, Ur 69 220 117 109 199 198     .res Assessment: Plan:    Shayann was  seen today for anxiety, depression, hallucinations and follow-up.  Diagnoses and all orders for this visit:  Schizoaffective disorder, bipolar type ([place]) -     buPROPion (WELLBUTRIN XL) 150 MG 24 hr tablet; Take 1 tablet (150 mg total) by mouth daily. -     liothyronine (CYTOMEL) 25 MCG tablet; TAKE 1 AND 1/2 TABLETS  EVERY MORNING -     loxapine (LOXITANE) 50 MG capsule; Take 6 capsules (300 mg total) by mouth at bedtime. -     QUEtiapine (SEROQUEL) 50 MG tablet; 1-2 tablets at night for sleep  Generalized anxiety disorder -     clonazePAM (KLONOPIN) 0.5 MG tablet; TAKE 1 TABLET BY MOUTH TWICE DAILY, AND TAKE 2 TABLETS AT BEDTIME AS NEEDED -     sertraline (ZOLOFT) 100 MG tablet; Take 2 tablets (200 mg total) by mouth daily.  Panic disorder with agoraphobia -     clonazePAM (KLONOPIN) 0.5 MG tablet; TAKE 1 TABLET BY MOUTH TWICE DAILY, AND TAKE 2 TABLETS AT BEDTIME AS NEEDED -     sertraline (ZOLOFT) 100 MG tablet; Take 2 tablets (200 mg total) by mouth daily.  Extrapyramidal movement disorder, drug-induced -     benztropine (COGENTIN) 0.5 MG tablet; 1-2 twice daily as needed for EPS  Insomnia due to mental condition -     QUEtiapine (SEROQUEL) 50 MG tablet; 1-2 tablets at night for sleep -     traZODone (DESYREL) 100 MG tablet; Take 1 tablet (100 mg total) by mouth at bedtime.    Greater than 50% of 45 minutes face to face time with patient was spent on counseling and coordination of care.  Maryna is chronically unstable and prone to extrapyramidal side effects complicating treatment. Very complicated patient on polypharmacy  and receiving ECT.  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 Specifically the command hallucinations to kill her self have resolved. Better back on loxapine 300 mg daily.  She is not significantly depressed at this time.  She continues maintenance ECT  Continue Cytomel to 1 1/2 of 25 mcg tablets each AM.  She's not having palpitations nor anxiety.     Because we are going to have to change the antipsychotic we will not be considering other medication changes today.  continue Wellbutrin to 150 .  Depression is better so far with  The Wellbutrin.  Disc alternative TMS to ECT.  Just she talked to the ECT team in [facility] about this. Continue ECT monthly for a few more months at least until spring.   Extensive discussion of clozapine in detail.   Including the risk of severe neutropenia, marked weight gain, sedation, metabolic problems, cardiac risk, etc.  Discussed the need for weekly blood test for at least 6 months.  However we also discussed this is the most effective antipsychotic on the market.  It matter may better control her voices.  She wishes to defer.  Continue Loxapine 300 mg daily.  Overall she is doing the best on this medication as compared to other antipsychotics. She cannot reduce this without hallucinating. She is posturing some which is causing some arm pain and neck pain.  This is been a problem in the past with various antipsychotics as well.  Will prescribe the lowest possible dose of benztropine to help counteract this side effect 0.5 to 1 mg twice  daily as needed.  discussed the side effects of this in detail.   Most effective option clozapine.  oPTION Fanapt.  She has had less orthostatic hypotension in the last year than she has had in previous  years.  She may tolerate this better now and it is much easier to use than clozapine. Keep Caplyta as a back up option.  Her low vitamin D level is supplemented and resolved with supplemental vitamin D.  Low Vitamin D increases the risk of depression  Per [facility] [place], prn for hallucinations bc expects more at the holidays and already on hiogh dose loxapine.  Need one with lower EPS.  Olanzapine SL 10 prn AH.  She took it once and it didn't seem to help  Doctor asked about Accutane.  Disc risk depression and SI. If you have any depression then stop it. No problems with it.  Option Ozempic or Saxenda for weight loss.  Disc in detail.  Counseled patient regarding potential benefits, risks, and side effects of lithium to include potential risk of lithium affecting thyroid and renal function.  Discussed need for periodic lab monitoring to determine drug level and to assess for potential adverse effects.  Counseled patient regarding signs and symptoms of lithium toxicity and advised that they notify office immediately or seek urgent medical attention if experiencing these signs and symptoms.  Patient advised to contact office with any questions or concerns. Being followed by nephro and labs inlcuding lithium levels noted above.   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 No indication to change bc lithium helps her stability so much.  Will continue to coordinate with nephrology;  Follow-up 2-3 mos  Lynder Parents, MD, DFAPA  Please see After Visit Summary for patient specific instructions.  No future appointments.  No orders of the defined types were placed in this encounter.     -------------------------------

## 2020-05-14 ENCOUNTER — Other Ambulatory Visit: Payer: Self-pay | Admitting: Psychiatry

## 2020-05-14 DIAGNOSIS — F5105 Insomnia due to other mental disorder: Secondary | ICD-10-CM

## 2020-05-14 DIAGNOSIS — F250 Schizoaffective disorder, bipolar type: Secondary | ICD-10-CM

## 2020-05-15 ENCOUNTER — Other Ambulatory Visit: Payer: Self-pay | Admitting: Psychiatry

## 2020-05-15 DIAGNOSIS — F250 Schizoaffective disorder, bipolar type: Secondary | ICD-10-CM

## 2020-05-15 DIAGNOSIS — F5105 Insomnia due to other mental disorder: Secondary | ICD-10-CM

## 2020-06-26 ENCOUNTER — Other Ambulatory Visit: Payer: Self-pay | Admitting: Psychiatry

## 2020-06-26 DIAGNOSIS — G2589 Other specified extrapyramidal and movement disorders: Secondary | ICD-10-CM

## 2020-06-26 DIAGNOSIS — T50905A Adverse effect of unspecified drugs, medicaments and biological substances, initial encounter: Secondary | ICD-10-CM

## 2020-07-13 ENCOUNTER — Other Ambulatory Visit: Payer: Self-pay | Admitting: Psychiatry

## 2020-07-13 DIAGNOSIS — F250 Schizoaffective disorder, bipolar type: Secondary | ICD-10-CM

## 2020-07-17 ENCOUNTER — Telehealth: Payer: Self-pay | Admitting: Psychiatry

## 2020-07-17 NOTE — Telephone Encounter (Signed)
Received fax from patient for completion of DMV driving privileges. Form placed in Dr. Alwyn Ren box.

## 2020-08-06 ENCOUNTER — Telehealth (INDEPENDENT_AMBULATORY_CARE_PROVIDER_SITE_OTHER): Payer: Medicare Other | Admitting: Psychiatry

## 2020-08-06 ENCOUNTER — Encounter: Payer: Self-pay | Admitting: Psychiatry

## 2020-08-06 DIAGNOSIS — F250 Schizoaffective disorder, bipolar type: Secondary | ICD-10-CM | POA: Diagnosis not present

## 2020-08-06 DIAGNOSIS — F411 Generalized anxiety disorder: Secondary | ICD-10-CM | POA: Diagnosis not present

## 2020-08-06 DIAGNOSIS — R7989 Other specified abnormal findings of blood chemistry: Secondary | ICD-10-CM

## 2020-08-06 DIAGNOSIS — F4001 Agoraphobia with panic disorder: Secondary | ICD-10-CM

## 2020-08-06 DIAGNOSIS — G251 Drug-induced tremor: Secondary | ICD-10-CM

## 2020-08-06 DIAGNOSIS — Z79899 Other long term (current) drug therapy: Secondary | ICD-10-CM

## 2020-08-06 DIAGNOSIS — N251 Nephrogenic diabetes insipidus: Secondary | ICD-10-CM

## 2020-08-06 DIAGNOSIS — F5105 Insomnia due to other mental disorder: Secondary | ICD-10-CM

## 2020-08-06 NOTE — Progress Notes (Signed)
Laura Mathews [facility] [id number] 02/09/1981 40 y.o.   Virtual Visit via [place]  I connected with pt by WebEx and verified that I am speaking with the correct person using two identifiers.   I discussed the limitations, risks, security and privacy concerns of performing an evaluation and management service by Laura Mathews and the availability of in person appointments. I also discussed with the patient that there may be a patient responsible charge related to this service. The patient expressed understanding and agreed to proceed.  I discussed the assessment and treatment plan with the patient. The patient was provided an opportunity to ask questions and all were answered. The patient agreed with the plan and demonstrated an understanding of the instructions.   The patient was advised to call back or seek an in-person evaluation if the symptoms worsen or if the condition fails to improve as anticipated.  I provided 45 minutes of video time during this encounter. The call started at 1000 and ended at 10:45. The patient was located at home and the provider was located home DT weather.   Subjective:   Patient ID:  Laura Mathews is a 40 y.o. (DOB Nov 07, 1980) female.  Chief Complaint:  Chief Complaint  Patient presents with  . Follow-up  . Depression  . Hallucinations  . Anxiety    Medication Refill Associated symptoms include myalgias and neck pain. Pertinent negatives include no fatigue, headaches or weakness.  Anxiety Symptoms include decreased concentration. Patient reports no confusion, dizziness, nervous/anxious behavior or suicidal ideas.      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  When seen August 2020.  We reduced the Cytomel to 37.5 mcg bc of suppressed TSH.  She's noticed no problems.  At visit September 19, 2018.  At that visit we reduced Wellbutrin XL 300 to 150 because of anxiety and recently  high blood pressure.    At her visit in April 2020.  She reported no problems from reducing the Wellbutrin and that her anxiety was somewhat better.  There were no changes made at the visit in April.  At visit October 2020.  No meds were changed.  Have gotten notice about [facility] not having access to loxapine which is a significant risk for this patient. She will try to access it.  Overall Loxapine has been very good to me but some movement disorder issues with toes moving, teeth grinding, Arm stiffness R, swallow involuntary movements.  seen May 25, 2019.  She remained on loxapine 300 mg.  Under stress she was still having hallucinations and she was given olanzapine 10 mg sublingual as needed auditory hallucinations.  She was continued on Wellbutrin XL 150 mg.  She was continued on Cytomel 37.5 mg every morning.  Also continued lithium 1200 mg daily and lamotrigine 200 mg daily. There was concern about the loxapine shortage and that she might have to be switched to another medication hopefully with low EPS risk such as Fanapt.  But she has a history of orthostatic hypotension and it is unclear if she could tolerate that medication.  seen July 28, 2019.  Because of the shortage of loxapine we had to wean and discontinue that medication.  She was given Caplyta as an alternative antipsychotic given multiple other failures and her tendency to be very sensitive to EPS and tardive dyskinesia. Patient took it for about 3 weeks and then called February 1 stating that she was having nausea and involuntary facial movements and  had found loxapine and wanted to return to it.  Therefore Caplyta was stopped and she was restarted on loxapine to quickly titrate back up to 300 mg nightly the same dose that she is taken in the past.  seen FEB 8 seen with her mother.  Ran out of Blairs for 3 days bc pharmacy didn't have it.  Just back to 300 mg loxapine as of last night.  Slept fine on Caplyta with less duration  needed. Mood pretty stable and not markedly depressed.  Today feels a ltttle psychotic with voices and feeling people are inside her a little confusion.  Movements from mouth have seemed better.  Has felt a little tight throat that scared her.  Mo notes facial grimacing and lip pursing has almost totally gone.   Mood really well overall with ECT reduced to once monthly. Reports getting "modified bilateral"  ECT.   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 Twin sister had baby boy .  Laura Mathews is doing well.  Applying for better job.  Some are in other states.  Not worrying about it.  Together for 13 years.    As of October 02, 2019, Really well overall.   Not manic and "not too psychotic".  Voices down to mostly noise.   Patient reports stable mood and denies depressed or irritable moods.  Anxiety is better and using clonazepam or Seroquel.    Walking more has helped and less depression improved motivation. Patient denies difficulty with sleep initiation or maintenance. Denies appetite disturbance.  Patient reports that energy and motivation have been good.  Patient denies any difficulty with concentration.  Patient denies any suicidal ideation.  No recent panic.   Quiet noise. No med changes.  11/29/19 appt the following noted: Needs trazodone and Seroquel to sleep.  Rare olanzapine for AH.  Still trouble falling asleep with meds and can lay in bed for hours.  Will sleep late to make up for it.   Otherwise well without mania nor depression.  Occ psychotic sx with stressors.  Nephro will leave her on lithium and start amiloride for Nephrogenic Diabetes Insipidus.  Has started and will fu with labs in 2 weeks.Sx polydipsia and polyuria and thirst.  Couple of accidents and 1 in public.  Said renal function was good otherwise.  No SI in a long time.   Tutoring 3 hours weekly.  Enjoys it.   Laura Mathews is not planning a job change at  this time.  Will sign lease for apt for another year. Plan: no med changes  05/07/2020 appointment with the following noted: Continued all meds.  Still doing maintenance ECT monthly. No olanzapine lately.  Only twice since had it.   For the most part don't hear voices unless under severe stress. Mood happy without depression. Doing deep breathing from DBT to help with anxiety.  I'm still an anxious person. 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   CO weight gain since here and thinks it's from using the Seroquel 50-100 mg  for sleep and now trying to use less. Sleep well with Seroquel and Klonopin.  SE EPS bc walking with arms bent and this is leading to some pain R >L.  Can straighten it out.  08/06/2020 appt with following noted: No olanzapine used in months. Well overall.  Mood pretty balanced now. No mania or depression now. Last had depression after  severe UTI. Not sure when but anxiety worse in the am and hard to get off the couch until early afternoon.  No specific thoughts or fears associated.   Still going to [employer] to meet friends. Not much caffeine 1 cup am.   Sleep really well.   Started Noom and lost 7 # so far.   Voices only problem when stressed.    No further near syncope for several mos.   SE overall been OK until lately some neck and back pain ? Related.    Thinks the depression may be helped by the Wellbutrin.   Started Accutane and no SE yet.    ECT still monthly.  Helped memory.  Asks about stretching it further.  However when she went 5-week intervals this last month she noticed more depression during the week preceding the ECT  Psychiatric medication history includes risperidone 3.5 mg EPS,  Abilify 30 mg withdrawal dyskinesia, perphenazine, Geodon, olanzapine, Saphris 5 mg, Haldol, Latuda 160, loxapine 300 mg, Seroquel constipation and U px,   lamotrigine, Depakote, lithium, clonazepam,  Fetzima, sertraline, Pristiq,, paroxetine,   citalopram, Lexapro,  Cytomel, topiramate, buspirone, gabapentin, trazodone propranolol with no response for anxiety ECT maintenance Under our psychiatric care since 2006.  Remote history of OD Klonopin.  Review of Systems:  Review of Systems  Constitutional: Positive for unexpected weight change. Negative for fatigue.  Endocrine: Positive for polyuria.  Genitourinary: Positive for urgency.  Musculoskeletal: Positive for back pain, myalgias and neck pain.  Neurological: Positive for tremors. Negative for dizziness, weakness and headaches.  Psychiatric/Behavioral: Positive for decreased concentration. Negative for agitation, behavioral problems, confusion, dysphoric mood, hallucinations, self-injury, sleep disturbance and suicidal ideas. The patient is not nervous/anxious and is not hyperactive.     Medications: I have reviewed the patient's current medications.  Current Outpatient Medications  Medication Sig Dispense Refill  . Acetylcysteine (NAC) 600 MG CAPS Take 600 mg by mouth 2 (two) times daily.    [name] b complex vitamins tablet Take 1 tablet by mouth daily.    [name] buPROPion (WELLBUTRIN XL) 150 MG 24 hr tablet Take 1 tablet (150 mg total) by mouth daily. 90 tablet 1  . Cholecalciferol (VITAMIN D3) 5000 units CAPS Take 1 capsule by mouth daily.    . clonazePAM (KLONOPIN) 0.5 MG tablet TAKE 1 TABLET BY MOUTH TWICE DAILY, AND TAKE 2 TABLETS AT BEDTIME AS NEEDED 100 tablet 2  . lamoTRIgine (LAMICTAL) 200 MG tablet Take 1 tablet (200 mg total) by mouth daily. 90 tablet 3  . liothyronine (CYTOMEL) 25 MCG tablet TAKE 1 AND 1/2 TABLETS EVERY MORNING 135 tablet 0  . lithium carbonate (LITHOBID) 300 MG CR tablet TAKE 1.5 TABLETS IN THE MORNING, AND 2 TABLETS IN THE PM 315 tablet 1  . loxapine (LOXITANE) 50 MG capsule Take 6 capsules (300 mg total) by mouth at bedtime. 540 capsule 1  . Melatonin 10 MG CAPS Take 10 mg by mouth.    . Multiple Vitamins-Minerals (MULTIVITAMIN ADULT) CHEW Chew by mouth.     . Omega-3 Fatty Acids (FISH OIL) 1200 MG CAPS Take by mouth.    . pyridOXINE (VITAMIN B-6) 100 MG tablet Take 100 mg by mouth daily.    . QUEtiapine (SEROQUEL) 50 MG tablet 1-2 tablets at night for sleep 180 tablet 0  . sertraline (ZOLOFT) 100 MG tablet Take 2 tablets (200 mg total) by mouth daily. 180 tablet 1  . traZODone (DESYREL) 100 MG tablet TAKE 1 TABLET AT BEDTIME 90 tablet 0  .  vitamin E 1000 UNIT capsule Take 1,000 Units by mouth daily.    . benztropine (COGENTIN) 0.5 MG tablet TAKE 1-2 TABLETS TWICE     DAILY AS NEEDED FOR        EXTRAPYRAMIDAL SYMPTOMS (Patient not taking: Reported on 08/06/2020) 180 tablet 0  . fluticasone (FLONASE) 50 MCG/ACT nasal spray 1 spray by Each Nare route daily.    [name] OLANZapine zydis (ZYPREXA) 10 MG disintegrating tablet Place 1 tablet (10 mg) under the tongue, 90 minutes before bedtime (Patient not taking: Reported on 08/06/2020) 30 tablet 1   No current facility-administered medications for this visit.    Medication Side Effects: Other: tremor, grinding teeth daytime, blurred vision, mild right arm posturing, sleep 11 hours.  Not much daytime sleepiness.  Allergies:  Allergies  Allergen Reactions  . Metformin And Related   . Naltrexone     History reviewed. No pertinent past medical history.  History reviewed. No pertinent family history.  Social History   Socioeconomic History  . Marital status: Legally Separated    Spouse name: Not on file  . Number of children: Not on file  . Years of education: Not on file  . Highest education level: Not on file  Occupational History  . Not on file  Tobacco Use  . Smoking status: Never Smoker  . Smokeless tobacco: Never Used  Substance and Sexual Activity  . Alcohol use: Not on file  . Drug use: Not on file  . Sexual activity: Not on file  Other Topics Concern  . Not on file  Social History Narrative  . Not on file   Social Determinants of Health   Financial Resource Strain: Not on file   Food Insecurity: Not on file  Transportation Needs: Not on file  Physical Activity: Not on file  Stress: Not on file  Social Connections: Not on file  Intimate Partner Violence: Not on file    Past Medical History, Surgical history, Social history, and Family history were reviewed and updated as appropriate.   Please see review of systems for further details on the patient's review from today.   Objective:   Physical Exam:  There were no vitals taken for this visit.  Physical Exam Constitutional:      General: She is not in acute distress.    Appearance: She is well-developed.  Neurological:     Mental Status: She is alert and oriented to person, place, and time.     Cranial Nerves: No dysarthria.  Psychiatric:        Attention and Perception: Attention normal. She does not perceive auditory or visual hallucinations.        Mood and Affect: Mood is anxious. Mood is not depressed. Affect is not labile, blunt, angry, tearful or inappropriate.        Speech: Speech normal. Speech is not slurred.        Behavior: Behavior normal. Behavior is cooperative.        Thought Content: Thought content normal. Thought content is not paranoid or delusional. Thought content does not include homicidal or suicidal ideation. Thought content does not include homicidal or suicidal plan.        Cognition and Memory: Cognition and memory normal.        Judgment: Judgment normal.     Comments: Less depression and anxiety but residual. Less AH back on Loxapine.     Lab Review:     Component Value Date/Time   NA 138 09/28/2019 0954  K 4.2 09/28/2019 0954   CL 103 09/28/2019 0954   CO2 23 09/28/2019 0954   GLUCOSE 93 09/28/2019 0954   GLUCOSE 99 10/10/2010 1150   BUN 16 09/28/2019 0954   CREATININE 0.81 09/28/2019 0954   CALCIUM 10.3 (H) 09/28/2019 0954   PROT 7.5 10/10/2010 0207   ALBUMIN 4.1 10/10/2010 0207   AST 23 10/10/2010 0207   ALT 19 10/10/2010 0207   ALKPHOS 40 10/10/2010 0207    BILITOT 0.4 10/10/2010 0207   GFRNONAA 94 09/28/2019 0954   GFRAA 108 09/28/2019 0954       Component Value Date/Time   WBC 16.7 (H) 10/10/2010 0207   RBC 4.50 10/10/2010 0207   HGB 12.7 04/14/2011 0956   HCT 40.0 10/10/2010 0223   PLT 225 10/10/2010 0207   MCV 86.7 10/10/2010 0207   MCH 28.7 10/10/2010 0207   MCHC 33.1 10/10/2010 0207   RDW 12.9 10/10/2010 0207   LYMPHSABS 3.3 10/10/2010 0207   MONOABS 1.7 (H) 10/10/2010 0207   EOSABS 0.4 10/10/2010 0207   BASOSABS 0.1 10/10/2010 0207    Lithium Lvl  Date Value Ref Range Status  09/28/2019 1.0 0.6 - 1.2 mmol/L Final    Comment:                                     Detection Limit = 0.1                           <0.1 indicates None Detected     TSH low on Cytomel in June 2020 No results found for: PHENYTOIN, PHENOBARB, VALPROATE, CBMZ  l  Component 04/17/20 03/06/20 02/13/20 01/09/20 12/25/19 12/07/19  Lithium Lvl 1.2 1.2 1.4High 1.1 1.0 1.0   Component 04/17/20 03/06/20 02/13/20 01/09/20 12/25/19 12/07/19  Sodium 130Low 135 137 136 135 137  Potassium 4.3 4.1 4.0 4.2 4.1 4.1  Chloride 103 105 105 104 105 105  Anion Gap 4Low _0 CO2 23.0 24.9 26.1 27.2 23.5 26.7  BUN 7Low _1 Creatinine 0.88 0.84High 0.80 0.82High 0.72 0.77  BUN/Creatinine Ratio _2 EGFR CKD-EPI Non-African American, Female 84 89 >90 >90 >90 >90  EGFR CKD-EPI African American, Female >90 >90 >90 >90 >90 >90  Glucose 91 99 89 112 95 98  Calcium 10.3 10.9High 10.8High 11.1High 10.9High 10.7High  Albumin 4.5 4.6 4.6 4.1 4.3 4.4  Total Protein 7.6 -- 7.9 -- -- --  Total Bilirubin 0.5 -- 0.5 -- -- --  AST 19 -- 19 -- -- --  ALT 19 -- 18 -- -- --  Alkaline Phosphatase 67 -- 71 -- -- --   Component 04/17/20 02/13/20 01/09/20 12/25/19 12/07/19 11/01/19  Osmolality, Ur 69 220 117 109 199 198     07/17/20 05/20/20 04/17/20 03/06/20 02/13/20 02/13/20  Sodium 135 137 130Low 135 137 --   Potassium 4.2 4.9 4.3 4.1 4.0 --  Chloride 104 105 103 105 105 --  CO2 25.9 27.5 23.0 24.9 26.1 --  Anion Gap 5 5 4Low 5 6 --  BUN 15 14 7Low 15 14 --  Creatinine 0.71 0.93 0.88 0.84High 0.80 --  BUN/Creatinine Ratio _3 --  EGFR CKD-EPI Non-African American, Female >90 79 84 89 >90 --  EGFR CKD-EPI African American, Female >90 91 >90 >90 >90 --  Glucose 92 89 91 99 89 --  Calcium 10.7 9.9 10.3 10.9High 10.8High --  Phosphorus 2.7 -- -- 3.4 -- 3.5  Albumin 4.4 -- 4.5 4.6 4.6 --   Lithium level 07/17/2020 was 1.1 on the current dose of lithium. .res Assessment: Plan:    Ozelle was seen today for follow-up, depression, hallucinations and anxiety.  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  Janely is chronically unstable and prone to extrapyramidal side effects complicating treatment. Very complicated patient on polypharmacy and receiving ECT.  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 Specifically the command hallucinations to kill her self have resolved. Better back on loxapine 300 mg daily.  She is not significantly depressed at this time.  She continues maintenance ECT  She has a pattern of marked anxiety in the morning.  She is able to go meet her friends at [facility] which is helpful.  Otherwise she finds her self sitting on the couch until the afternoon when the anxiety  appears to lift some.  It might possibly be related to the Wellbutrin so we will hold that for a week to see if it is related.  She does not appear to be drinking too much caffeine but she may be sensitive to caffeine in part due to the Wellbutrin.  The pattern of anxiety or depression being worse in the morning and better as the day progresses and is not an unusual pattern for organic depression and anxiety and this was discussed.  She has failed to respond to some of the milder things for anxiety and we do not want to use a lot of benzodiazepine if we can avoid it because of cognitive reasons.  We may consider low-dose Luvox as it tends to have less mood cycling than other SSRIs.   Hold  Wellbutrin 150 for 1 week to see if anxiety is better .   Depression seemed better so far with  The Wellbutrin.   For anxiety consider low dose Luvox which has less risk of mood cycling than other SSRIs usually and helps anxiety.  Disc alternative TMS to ECT.  Continue ECT monthly for a few more months at least until spring.   Extensive discussion of clozapine in detail.   Including the risk of severe neutropenia, marked weight gain, sedation, metabolic problems, cardiac risk, etc.  Discussed the need for weekly blood test for at least 6 months.  However we also discussed this is the most effective antipsychotic on the market.  It matter may better control her voices.  She wishes to defer.  Continue Loxapine 300 mg daily.  Overall she is doing the best on this medication as compared to other antipsychotics. She cannot reduce this without hallucinating. She is posturing some which is causing some arm pain and neck pain.  This is been a problem in the past with various antipsychotics as well.  Will prescribe the lowest possible dose of benztropine to help counteract this side effect 0.5 to 1 mg  twice daily as needed.  discussed the side effects of this in detail.   Most effective option clozapine.  oPTION Fanapt.   She has had less orthostatic hypotension in the last year than she has had in previous years.  She may tolerate this better now and it is much easier to use than clozapine. Keep Caplyta as a back up option.  Her low vitamin D level is supplemented and resolved with supplemental vitamin D.  Low Vitamin D increases the risk of depression  Hold olanzapine because it did not seem to help much on a as needed basis and she is concerned about weight gain.  Doctor asked about Accutane.  Disc risk depression and SI. If you have any depression then stop it. No problems with it.  Option Ozempic or Saxenda for weight loss.  Disc in detail.  Counseled patient regarding potential benefits, risks, and side effects of lithium to include potential risk of lithium affecting thyroid and renal function.  Discussed need for periodic lab monitoring to determine drug level and to assess for potential adverse effects.  Counseled patient regarding signs and symptoms of lithium toxicity and advised that they notify office immediately or seek urgent medical attention if experiencing these signs and symptoms.  Patient advised to contact office with any questions or concerns. Being followed by nephro and labs inlcuding lithium levels noted above.   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 No indication to change bc lithium helps her stability so much.  Will continue to coordinate with nephrology; Labs from December reviewed and noted above.  No change indicated.  Follow-up 2 mos  Lynder Parents, MD, DFAPA  Please see After Visit Summary for patient specific instructions.  No future appointments.  No orders of the defined types were placed in this encounter.     -------------------------------

## 2020-08-07 ENCOUNTER — Other Ambulatory Visit: Payer: Self-pay | Admitting: Psychiatry

## 2020-08-07 DIAGNOSIS — F5105 Insomnia due to other mental disorder: Secondary | ICD-10-CM

## 2020-08-07 MED ORDER — TRAZODONE HCL 100 MG PO TABS: 100.0000 mg | ORAL_TABLET | Freq: Every day | ORAL | 1 refills | Status: DC

## 2020-10-01 ENCOUNTER — Other Ambulatory Visit: Payer: Self-pay | Admitting: Psychiatry

## 2020-10-01 DIAGNOSIS — F250 Schizoaffective disorder, bipolar type: Secondary | ICD-10-CM

## 2020-10-01 DIAGNOSIS — F5105 Insomnia due to other mental disorder: Secondary | ICD-10-CM

## 2020-10-02 ENCOUNTER — Telehealth: Payer: Self-pay | Admitting: Psychiatry

## 2020-10-02 NOTE — Telephone Encounter (Signed)
Lithium level checked on 09/27/2020 was 1.0.  Therefore no room to increase that dosage. On sertraline 200 mg daily so no room to increase that dosage.  At the last appointment, I asked her to hold the Wellbutrin for a week to see if her anxiety was better.  I do not know if she stopped it or restarted it.  If she stopped it have her restart the Wellbutrin XL 150 mg 1 AM.  If she is still taking it have her increase the Wellbutrin XL to 1-1/2 tablets every morning.  The bottle will say not to cut the tablets but because her dosage is low it is okay to cut the tablets if needed.

## 2020-10-02 NOTE — Telephone Encounter (Signed)
Rtc to pt and she reports she did stop it but restarted it after 2 weeks, advised her to increase Wellbutrin XL to 1.5 tablets of 150 mg tablet. She will call back with worsening symptoms

## 2020-10-02 NOTE — Telephone Encounter (Signed)
Rtc to pt and she reports having increased depression for about 1 week now, she had ECT on Friday 3/11 and will have ECT again this Friday the 18th. She has been waking up earlier then usual and unable to fall asleep, about an 1-1/2 hours early.  Asking if there is anything she can do to help? Suicidal thoughts but nothing specific or any intent

## 2020-10-02 NOTE — Telephone Encounter (Signed)
Pt has increased depression and suicidal thoughts. No suicidal intent. Please call. Urgent.

## 2020-10-15 ENCOUNTER — Other Ambulatory Visit: Payer: Self-pay | Admitting: Psychiatry

## 2020-10-15 DIAGNOSIS — F250 Schizoaffective disorder, bipolar type: Secondary | ICD-10-CM

## 2020-10-22 ENCOUNTER — Telehealth (INDEPENDENT_AMBULATORY_CARE_PROVIDER_SITE_OTHER): Payer: Medicare Other | Admitting: Psychiatry

## 2020-10-22 ENCOUNTER — Encounter: Payer: Self-pay | Admitting: Psychiatry

## 2020-10-22 DIAGNOSIS — F250 Schizoaffective disorder, bipolar type: Secondary | ICD-10-CM

## 2020-10-22 DIAGNOSIS — F4001 Agoraphobia with panic disorder: Secondary | ICD-10-CM | POA: Diagnosis not present

## 2020-10-22 DIAGNOSIS — G251 Drug-induced tremor: Secondary | ICD-10-CM

## 2020-10-22 DIAGNOSIS — F5105 Insomnia due to other mental disorder: Secondary | ICD-10-CM | POA: Diagnosis not present

## 2020-10-22 DIAGNOSIS — F411 Generalized anxiety disorder: Secondary | ICD-10-CM | POA: Diagnosis not present

## 2020-10-22 DIAGNOSIS — Z79899 Other long term (current) drug therapy: Secondary | ICD-10-CM

## 2020-10-22 DIAGNOSIS — R7989 Other specified abnormal findings of blood chemistry: Secondary | ICD-10-CM

## 2020-10-22 MED ORDER — CAPLYTA 42 MG PO CAPS: 1.0000 | ORAL_CAPSULE | Freq: Every day | ORAL | 1 refills | Status: DC

## 2020-10-22 NOTE — Progress Notes (Signed)
Laura Mathews [facility] [id number] 12-15-1980 40 y.o.   Video Visit via My Chart  I connected with pt by My Chart and verified that I am speaking with the correct person using two identifiers.   I discussed the limitations, risks, security and privacy concerns of performing an evaluation and management service by My Chart  and the availability of in person appointments. I also discussed with the patient that there may be a patient responsible charge related to this service. The patient expressed understanding and agreed to proceed.  I discussed the assessment and treatment plan with the patient. The patient was provided an opportunity to ask questions and all were answered. The patient agreed with the plan and demonstrated an understanding of the instructions.   The patient was advised to call back or seek an in-person evaluation if the symptoms worsen or if the condition fails to improve as anticipated.  I provided 30 minutes of video time during this encounter.  The patient was located at home and the provider was located office. Session from 1100 to 1130.   Subjective:   Patient ID:  Laura Mathews is a 41 y.o. (DOB 12-30-1980) female.  Chief Complaint:  Chief Complaint  Patient presents with  . Follow-up  . Schizoaffective disorder, bipolar type ([place])  . Depression  . Anxiety    Medication Refill Associated symptoms include myalgias and neck pain. Pertinent negatives include no fatigue, headaches or weakness.  Anxiety Symptoms include decreased concentration. Patient reports no confusion, dizziness, nervous/anxious behavior or suicidal ideas.      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  When seen August 2020.  We reduced the Cytomel to 37.5 mcg bc of suppressed TSH.  She's noticed no problems.  At visit September 19, 2018.  At that visit we reduced Wellbutrin XL 300 to 150 because of anxiety  and recently high blood pressure.    At her visit in April 2020.  She reported no problems from reducing the Wellbutrin and that her anxiety was somewhat better.  There were no changes made at the visit in April.  At visit October 2020.  No meds were changed.  Have gotten notice about [facility] not having access to loxapine which is a significant risk for this patient. She will try to access it.  Overall Loxapine has been very good to me but some movement disorder issues with toes moving, teeth grinding, Arm stiffness R, swallow involuntary movements.  seen May 25, 2019.  She remained on loxapine 300 mg.  Under stress she was still having hallucinations and she was given olanzapine 10 mg sublingual as needed auditory hallucinations.  She was continued on Wellbutrin XL 150 mg.  She was continued on Cytomel 37.5 mg every morning.  Also continued lithium 1200 mg daily and lamotrigine 200 mg daily. There was concern about the loxapine shortage and that she might have to be switched to another medication hopefully with low EPS risk such as Fanapt.  But she has a history of orthostatic hypotension and it is unclear if she could tolerate that medication.  seen July 28, 2019.  Because of the shortage of loxapine we had to wean and discontinue that medication.  She was given Caplyta as an alternative antipsychotic given multiple other failures and her tendency to be very sensitive to EPS and tardive dyskinesia. Patient took it for about 3 weeks and then called February 1 stating that she was having nausea and involuntary  facial movements and had found loxapine and wanted to return to it.  Therefore Caplyta was stopped and she was restarted on loxapine to quickly titrate back up to 300 mg nightly the same dose that she is taken in the past.  seen FEB 8 seen with her mother.  Ran out of [place] for 3 days bc pharmacy didn't have it.  Just back to 300 mg loxapine as of last night.  Slept fine on Caplyta with less  duration needed. Mood pretty stable and not markedly depressed.  Today feels a ltttle psychotic with voices and feeling people are inside her a little confusion.  Movements from mouth have seemed better.  Has felt a little tight throat that scared her.  Mo notes facial grimacing and lip pursing has almost totally gone.   Mood really well overall with ECT reduced to once monthly. Reports getting "modified bilateral"  ECT.   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 Twin sister had baby boy .  Laura Mathews is doing well.  Applying for better job.  Some are in other states.  Not worrying about it.  Together for 13 years.    As of October 02, 2019, Really well overall.   Not manic and "not too psychotic".  Voices down to mostly noise.   Patient reports stable mood and denies depressed or irritable moods.  Anxiety is better and using clonazepam or Seroquel.    Walking more has helped and less depression improved motivation. Patient denies difficulty with sleep initiation or maintenance. Denies appetite disturbance.  Patient reports that energy and motivation have been good.  Patient denies any difficulty with concentration.  Patient denies any suicidal ideation.  No recent panic.   Quiet noise. No med changes.  11/29/19 appt the following noted: Needs trazodone and Seroquel to sleep.  Rare olanzapine for AH.  Still trouble falling asleep with meds and can lay in bed for hours.  Will sleep late to make up for it.   Otherwise well without mania nor depression.  Occ psychotic sx with stressors.  Nephro will leave her on lithium and start amiloride for Nephrogenic Diabetes Insipidus.  Has started and will fu with labs in 2 weeks.Sx polydipsia and polyuria and thirst.  Couple of accidents and 1 in public.  Said renal function was good otherwise.  No SI in a long time.   Tutoring 3 hours weekly.  Enjoys it.   Laura Mathews is not planning a job  change at this time.  Will sign lease for apt for another year. Plan: no med changes  05/07/2020 appointment with the following noted: Continued all meds.  Still doing maintenance ECT monthly. No olanzapine lately.  Only twice since had it.   For the most part don't hear voices unless under severe stress. Mood happy without depression. Doing deep breathing from DBT to help with anxiety.  I'm still an anxious person. 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   CO weight gain since here and thinks it's from using the Seroquel 50-100 mg  for sleep and now trying to use less. Sleep well with Seroquel and Klonopin.  SE EPS bc walking with arms bent and this is leading to some pain R >L.  Can straighten it out.  08/06/2020 appt with following noted: No olanzapine used in months. Well overall.  Mood pretty balanced now. No mania or depression now. Last  had depression after severe UTI. Not sure when but anxiety worse in the am and hard to get off the couch until early afternoon.  No specific thoughts or fears associated.   Still going to [employer] to meet friends. Not much caffeine 1 cup am.   Sleep really well.   Started Noom and lost 7 # so far.   Voices only problem when stressed.   Plan: Hold  Wellbutrin 150 for 1 week to see if anxiety is better .    10/02/2020 phone call from patient complaining of worsening depression with suicidal thoughts without intent. Nurse note:Rtc to pt and she reports having increased depression for about 1 week now, she had ECT on Friday 3/11 and will have ECT again this Friday the 18th. She has been waking up earlier then usual and unable to fall asleep, about an 1-1/2 hours early.  Asking if there is anything she can do to help? Suicidal thoughts but nothing specific or any intent MD response:Lithium level checked on 09/27/2020 was 1.0.  Therefore no room to increase that dosage. On sertraline 200 mg daily so no room to increase that dosage. At the  last appointment, I asked her to hold the Wellbutrin for a week to see if her anxiety was better.  I do not know if she stopped it or restarted it.  If she stopped it have her restart the Wellbutrin XL 150 mg 1 AM.  If she is still taking it have her increase the Wellbutrin XL to 1-1/2 tablets every morning.  The bottle will say not to cut the tablets but because her dosage is low it is okay to cut the tablets if needed. Nurse contact with patient:Rtc to pt and she reports she did stop it but restarted it after 2 weeks, advised her to increase Wellbutrin XL to 1.5 tablets of 150 mg tablet. She will call back with worsening symptoms  10/22/2020 appointment with the following noted: Doing a lot better with depression.  Don't feel depressed anymore.  Had extra ECT 2 per week for 3 weeks. Ketamine used and triggered hallucinations for a couple of weeks which were visual shapes and not scary.   Feels like heart is beating too fast and hard to relax and harder to sleep. While off Wellbutrin anxiety feelings got better. Asks if other treatment options exist for depression besides ECT. Mom on session says her memory is Terrible DT ECT. Getting modified bilateral ECT.  No further near syncope for several mos.   SE overall been OK until lately some neck and back pain ? Related.    Thinks the depression may be helped by the Wellbutrin.   Started Accutane and no SE yet.    ECT still monthly.  Helped memory.  Asks about stretching it further.  However when she went 5-week intervals this last month she noticed more depression during the week preceding the ECT  Psychiatric medication history includes risperidone 3.5 mg EPS,  Abilify 30 mg withdrawal dyskinesia, perphenazine, Geodon, olanzapine, Saphris 5 mg, Haldol, Latuda 160, loxapine 300 mg, Seroquel constipation and U px,   lamotrigine, Depakote, lithium, clonazepam,  Fetzima, sertraline, Pristiq,, paroxetine,  citalopram, Lexapro,  Cytomel, topiramate,  buspirone, gabapentin, trazodone propranolol with no response for anxiety ECT maintenance Under our psychiatric care since 2006.  Remote history of OD Klonopin.  Review of Systems:  Review of Systems  Constitutional: Positive for unexpected weight change. Negative for fatigue.  Endocrine: Positive for polyuria.  Genitourinary: Negative for urgency.  Musculoskeletal: Positive for back pain, myalgias and neck pain.  Neurological: Positive for tremors. Negative for dizziness, weakness and headaches.  Psychiatric/Behavioral: Positive for decreased concentration. Negative for agitation, behavioral problems, confusion, dysphoric mood, hallucinations, self-injury, sleep disturbance and suicidal ideas. The patient is not nervous/anxious and is not hyperactive.     Medications: I have reviewed the patient's current medications.  Current Outpatient Medications  Medication Sig Dispense Refill  . b complex vitamins tablet Take 1 tablet by mouth daily.    [name] buPROPion (WELLBUTRIN XL) 150 MG 24 hr tablet TAKE 1 TABLET DAILY (Patient taking differently: 1.5 tablet daily) 90 tablet 1  . Cholecalciferol (VITAMIN D3) 5000 units CAPS Take 1 capsule by mouth daily.    . clonazePAM (KLONOPIN) 0.5 MG tablet TAKE 1 TABLET BY MOUTH TWICE DAILY, AND TAKE 2 TABLETS AT BEDTIME AS NEEDED 100 tablet 2  . lamoTRIgine (LAMICTAL) 200 MG tablet TAKE 1 TABLET DAILY 90 tablet 3  . liothyronine (CYTOMEL) 25 MCG tablet TAKE 1 AND 1/2 TABLETS EVERY MORNING 135 tablet 0  . lithium carbonate (LITHOBID) 300 MG CR tablet TAKE 1.5 TABLETS IN THE MORNING, AND 2 TABLETS IN THE PM 315 tablet 1  . loxapine (LOXITANE) 50 MG capsule TAKE 6 CAPSULES AT BEDTIME 540 capsule 1  . Lumateperone Tosylate (CAPLYTA) 42 MG CAPS Take 1 capsule by mouth daily. 30 capsule 1  . Melatonin 10 MG CAPS Take 10 mg by mouth.    . Omega-3 Fatty Acids (FISH OIL) 1200 MG CAPS Take by mouth.    . QUEtiapine (SEROQUEL) 50 MG tablet TAKE 1 TO 2 TABLETS AT      NIGHT FOR SLEEP 180 tablet 0  . sertraline (ZOLOFT) 100 MG tablet Take 2 tablets (200 mg total) by mouth daily. 180 tablet 1  . traZODone (DESYREL) 100 MG tablet Take 1 tablet (100 mg total) by mouth at bedtime. 90 tablet 1  . Acetylcysteine (NAC) 600 MG CAPS Take 600 mg by mouth 2 (two) times daily. (Patient not taking: Reported on 10/22/2020)    . benztropine (COGENTIN) 0.5 MG tablet TAKE 1-2 TABLETS TWICE     DAILY AS NEEDED FOR        EXTRAPYRAMIDAL SYMPTOMS (Patient not taking: No sig reported) 180 tablet 0  . fluticasone (FLONASE) 50 MCG/ACT nasal spray 1 spray by Each Nare route daily.    . Multiple Vitamins-Minerals (MULTIVITAMIN ADULT) CHEW Chew by mouth. (Patient not taking: Reported on 10/22/2020)    . pyridOXINE (VITAMIN B-6) 100 MG tablet Take 100 mg by mouth daily. (Patient not taking: Reported on 10/22/2020)    . vitamin E 1000 UNIT capsule Take 1,000 Units by mouth daily. (Patient not taking: Reported on 10/22/2020)     No current facility-administered medications for this visit.    Medication Side Effects: Other: tremor, grinding teeth daytime, blurred vision, mild right arm posturing, sleep 11 hours.  Not much daytime sleepiness. Stimulating SE Wellbutrin  Allergies:  Allergies  Allergen Reactions  . Metformin And Related   . Naltrexone     History reviewed. No pertinent past medical history.  History reviewed. No pertinent family history.  Social History   Socioeconomic History  . Marital status: Legally Separated    Spouse name: Not on file  . Number of children: Not on file  . Years of education: Not on file  . Highest education level: Not on file  Occupational History  . Not on file  Tobacco Use  . Smoking status: Never Smoker  .  Smokeless tobacco: Never Used  Substance and Sexual Activity  . Alcohol use: Not on file  . Drug use: Not on file  . Sexual activity: Not on file  Other Topics Concern  . Not on file  Social History Narrative  . Not on file    Social Determinants of Health   Financial Resource Strain: Not on file  Food Insecurity: Not on file  Transportation Needs: Not on file  Physical Activity: Not on file  Stress: Not on file  Social Connections: Not on file  Intimate Partner Violence: Not on file    Past Medical History, Surgical history, Social history, and Family history were reviewed and updated as appropriate.   Please see review of systems for further details on the patient's review from today.   Objective:   Physical Exam:  There were no vitals taken for this visit.  Physical Exam Constitutional:      General: She is not in acute distress.    Appearance: She is well-developed.  Neurological:     Mental Status: She is alert and oriented to person, place, and time.     Cranial Nerves: No dysarthria.  Psychiatric:        Attention and Perception: Attention normal. She does not perceive auditory or visual hallucinations.        Mood and Affect: Mood is anxious and depressed. Affect is not labile, blunt, angry, tearful or inappropriate.        Speech: Speech normal. Speech is not slurred.        Behavior: Behavior normal. Behavior is cooperative.        Thought Content: Thought content normal. Thought content is not paranoid or delusional. Thought content does not include homicidal or suicidal ideation. Thought content does not include homicidal or suicidal plan.        Cognition and Memory: Cognition and memory normal.        Judgment: Judgment normal.     Comments: Less depression and anxiety but residual. Less AH back on Loxapine.     Lab Review:     Component Value Date/Time   NA 138 09/28/2019 0954   K 4.2 09/28/2019 0954   CL 103 09/28/2019 0954   CO2 23 09/28/2019 0954   GLUCOSE 93 09/28/2019 0954   GLUCOSE 99 10/10/2010 1150   BUN 16 09/28/2019 0954   CREATININE 0.81 09/28/2019 0954   CALCIUM 10.3 (H) 09/28/2019 0954   PROT 7.5 10/10/2010 0207   ALBUMIN 4.1 10/10/2010 0207   AST 23  10/10/2010 0207   ALT 19 10/10/2010 0207   ALKPHOS 40 10/10/2010 0207   BILITOT 0.4 10/10/2010 0207   GFRNONAA 94 09/28/2019 0954   GFRAA 108 09/28/2019 0954       Component Value Date/Time   WBC 16.7 (H) 10/10/2010 0207   RBC 4.50 10/10/2010 0207   HGB 12.7 04/14/2011 0956   HCT 40.0 10/10/2010 0223   PLT 225 10/10/2010 0207   MCV 86.7 10/10/2010 0207   MCH 28.7 10/10/2010 0207   MCHC 33.1 10/10/2010 0207   RDW 12.9 10/10/2010 0207   LYMPHSABS 3.3 10/10/2010 0207   MONOABS 1.7 (H) 10/10/2010 0207   EOSABS 0.4 10/10/2010 0207   BASOSABS 0.1 10/10/2010 0207    Lithium Lvl  Date Value Ref Range Status  09/28/2019 1.0 0.6 - 1.2 mmol/L Final    Comment:  Detection Limit = 0.1                           <0.1 indicates None Detected     TSH low on Cytomel in June 2020 No results found for: PHENYTOIN, PHENOBARB, VALPROATE, CBMZ  l  Component 04/17/20 03/06/20 02/13/20 01/09/20 12/25/19 12/07/19  Lithium Lvl 1.2 1.2 1.4High 1.1 1.0 1.0   Component 04/17/20 03/06/20 02/13/20 01/09/20 12/25/19 12/07/19  Sodium 130Low 135 137 136 135 137  Potassium 4.3 4.1 4.0 4.2 4.1 4.1  Chloride 103 105 105 104 105 105  Anion Gap 4Low _0 CO2 23.0 24.9 26.1 27.2 23.5 26.7  BUN 7Low _1 Creatinine 0.88 0.84High 0.80 0.82High 0.72 0.77  BUN/Creatinine Ratio _2 EGFR CKD-EPI Non-African American, Female 84 89 >90 >90 >90 >90  EGFR CKD-EPI African American, Female >90 >90 >90 >90 >90 >90  Glucose 91 99 89 112 95 98  Calcium 10.3 10.9High 10.8High 11.1High 10.9High 10.7High  Albumin 4.5 4.6 4.6 4.1 4.3 4.4  Total Protein 7.6 -- 7.9 -- -- --  Total Bilirubin 0.5 -- 0.5 -- -- --  AST 19 -- 19 -- -- --  ALT 19 -- 18 -- -- --  Alkaline Phosphatase 67 -- 71 -- -- --   Component 04/17/20 02/13/20 01/09/20 12/25/19 12/07/19 11/01/19  Osmolality, Ur 69 220 117 109 199 198     07/17/20 05/20/20 04/17/20  03/06/20 02/13/20 02/13/20  Sodium 135 137 130Low 135 137 --  Potassium 4.2 4.9 4.3 4.1 4.0 --  Chloride 104 105 103 105 105 --  CO2 25.9 27.5 23.0 24.9 26.1 --  Anion Gap 5 5 4Low 5 6 --  BUN 15 14 7Low 15 14 --  Creatinine 0.71 0.93 0.88 0.84High 0.80 --  BUN/Creatinine Ratio _3 --  EGFR CKD-EPI Non-African American, Female >90 79 84 89 >90 --  EGFR CKD-EPI African American, Female >90 91 >90 >90 >90 --  Glucose 92 89 91 99 89 --  Calcium 10.7 9.9 10.3 10.9High 10.8High --  Phosphorus 2.7 -- -- 3.4 -- 3.5  Albumin 4.4 -- 4.5 4.6 4.6 --   Lithium level 07/17/2020 was 1.1 on the current dose of lithium. .res Assessment: Plan:    Ivanell was seen today for follow-up, schizoaffective disorder, bipolar type ([facility]), depression and anxiety.  Diagnoses and all orders for this visit:  Schizoaffective disorder, bipolar type ([place]) -     Lumateperone Tosylate (CAPLYTA) 42 MG CAPS; Take 1 capsule by mouth daily.  Generalized anxiety disorder  Panic disorder with agoraphobia  Insomnia due to mental condition  Lithium-induced tremor  Low serum vitamin D  Lithium use    Greater than 50% of 45 minutes face to face time with patient was spent on counseling and coordination of care.  Meriel is chronically unstable and prone to extrapyramidal side effects complicating treatment. Very complicated patient on polypharmacy and receiving ECT.  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 Specifically the command hallucinations to kill her self have resolved. Better back on loxapine 300 mg daily.  She is not significantly depressed at this time.  She continues maintenance ECT  She has a pattern of marked anxiety in  the morning.  She is able to go meet  her friends at [facility] which is helpful.  Otherwise she finds her self sitting on the couch until the afternoon when the anxiety appears to lift some.  It might possibly be related to the Wellbutrin so we will hold that for a week to see if it is related.  She does not appear to be drinking too much caffeine but she may be sensitive to caffeine in part due to the Wellbutrin.  The pattern of anxiety or depression being worse in the morning and better as the day progresses and is not an unusual pattern for organic depression and anxiety and this was discussed.  She has failed to respond to some of the milder things for anxiety and we do not want to use a lot of benzodiazepine if we can avoid it because of cognitive reasons.  We may consider low-dose Luvox as it tends to have less mood cycling than other SSRIs.   DC Wellbutrin  And start Caplyta 42 mg daily. .    The hope is this will help bipolar depression enough that she could sing consider stopping ECT.  She and her mother are concerned about ECTs effect on her memory which affects her overall quality of life.  We cannot discontinue the loxapine at this time without worsening hallucinations.  However if the Caplyta is helpful for depression then we will attempt to gradually wean loxapine.  We have discussed that is unusual to use 2 antipsychotics together but in this case it is medically necessary.  Discussed side effects. Depression  better after extra ECT in March 2022.  For anxiety consider low dose Luvox which has less risk of mood cycling than other SSRIs usually and helps anxiety.  Extensive discussion of clozapine in detail.   Including the risk of severe neutropenia, marked weight gain, sedation, metabolic problems, cardiac risk, etc.  Discussed the need for weekly blood test for at least 6 months.  However we also discussed this is the most effective antipsychotic on the market.  It matter may better control her voices.  She wishes to  defer.  Continue Loxapine 300 mg daily.  Overall she is doing the best on this medication as compared to other antipsychotics. She cannot reduce this without hallucinating. She is posturing some which is causing some arm pain and neck pain.  This is been a problem in the past with various antipsychotics as well.  Will prescribe the lowest possible dose of benztropine to help counteract this side effect 0.5 to 1 mg twice daily as needed.  discussed the side effects of this in detail.   Most effective option clozapine.  oPTION Fanapt.  She has had less orthostatic hypotension in the last year than she has had in previous years.  She may tolerate this better now and it is much easier to use than clozapine.  Her low vitamin D level is supplemented and resolved with supplemental vitamin D.  Low Vitamin D increases the risk of depression  Option Ozempic or Saxenda for weight loss.  Disc in detail in previous visit  Counseled patient regarding potential benefits, risks, and side effects of lithium to include potential risk of lithium affecting thyroid and renal function.  Discussed need for periodic lab monitoring to determine drug level and to assess for potential adverse effects.  Counseled patient regarding signs and symptoms of lithium toxicity and advised that they notify office immediately or seek urgent medical attention if experiencing these  signs and symptoms.  Patient advised to contact office with any questions or concerns. Being followed by nephro and labs inlcuding lithium levels noted above.   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 No indication to change bc lithium helps her stability so much.  Will continue to coordinate with nephrology; Labs from December reviewed and noted above.  No change indicated.  Follow-up 6 weeks  Lynder Parents, MD, DFAPA  Please see After Visit Summary for patient specific instructions.  No future appointments.  No orders of the  defined types were placed in this encounter.     -------------------------------

## 2020-10-25 ENCOUNTER — Encounter: Payer: Self-pay | Admitting: Psychiatry

## 2020-10-25 ENCOUNTER — Telehealth: Payer: Self-pay | Admitting: Psychiatry

## 2020-10-25 ENCOUNTER — Other Ambulatory Visit: Payer: Self-pay | Admitting: Psychiatry

## 2020-10-25 DIAGNOSIS — T50905A Adverse effect of unspecified drugs, medicaments and biological substances, initial encounter: Secondary | ICD-10-CM

## 2020-10-25 DIAGNOSIS — G2589 Other specified extrapyramidal and movement disorders: Secondary | ICD-10-CM

## 2020-10-25 DIAGNOSIS — F5105 Insomnia due to other mental disorder: Secondary | ICD-10-CM

## 2020-10-25 DIAGNOSIS — F250 Schizoaffective disorder, bipolar type: Secondary | ICD-10-CM

## 2020-10-25 NOTE — Telephone Encounter (Signed)
Prior authorization has already been submitted, waiting for response.

## 2020-10-25 NOTE — Telephone Encounter (Signed)
Caplyta approved

## 2020-10-25 NOTE — Telephone Encounter (Signed)
Pt called and said that the insurance contacted her and said that the caplyta was approved and that she could pick it up at the pharmacy. However the pharmacy said that it needs a PA . Please contact the pharmacy to see what we need to do. I don't see any notes about the Pa

## 2020-10-29 NOTE — Telephone Encounter (Signed)
p 

## 2020-11-19 ENCOUNTER — Telehealth: Payer: Self-pay | Admitting: Psychiatry

## 2020-11-19 NOTE — Telephone Encounter (Signed)
Reviewed

## 2020-11-19 NOTE — Telephone Encounter (Signed)
Phone call from physician assistant at [facility] urgent care.  Patient presented there complaining of acute suicidal thoughts with thoughts of cutting her wrists.  Patient has continued to receive medicines as scheduled and ECT monthly at [facility].  It was suggested to the physician's assistant to send the patient to the emergency room as she is willing to go.  She should be evaluated for admission.  She is scheduled to receive ECT again tomorrow and it is recommended that that be accomplished if possible.  Physician's assistant agreed with the plan.  No med changes were indicated.

## 2020-11-19 NOTE — Telephone Encounter (Signed)
I spoke with urgent care PA and pt has agreed to go to the hospital ER for evaluation and possible admission.  No need to call at this time.

## 2020-11-19 NOTE — Telephone Encounter (Signed)
Last seen 10/22/20. Laura Mathews called to say she is feeling suicidal and is going to the Urgent Care near her. She does have a friend with her and is staying with her. Phone number is [phone].

## 2020-11-19 NOTE — Telephone Encounter (Signed)
FYI.Should I still call her?

## 2020-11-20 NOTE — Telephone Encounter (Signed)
She needs appt scheduled as available.

## 2020-11-27 ENCOUNTER — Telehealth: Payer: Self-pay | Admitting: Psychiatry

## 2020-11-27 ENCOUNTER — Other Ambulatory Visit: Payer: Self-pay

## 2020-11-27 ENCOUNTER — Ambulatory Visit (INDEPENDENT_AMBULATORY_CARE_PROVIDER_SITE_OTHER): Payer: Medicare Other | Admitting: Psychiatry

## 2020-11-27 ENCOUNTER — Encounter: Payer: Self-pay | Admitting: Psychiatry

## 2020-11-27 DIAGNOSIS — Z79899 Other long term (current) drug therapy: Secondary | ICD-10-CM

## 2020-11-27 DIAGNOSIS — F411 Generalized anxiety disorder: Secondary | ICD-10-CM

## 2020-11-27 DIAGNOSIS — R7989 Other specified abnormal findings of blood chemistry: Secondary | ICD-10-CM

## 2020-11-27 DIAGNOSIS — G251 Drug-induced tremor: Secondary | ICD-10-CM

## 2020-11-27 DIAGNOSIS — F5105 Insomnia due to other mental disorder: Secondary | ICD-10-CM

## 2020-11-27 DIAGNOSIS — F250 Schizoaffective disorder, bipolar type: Secondary | ICD-10-CM | POA: Diagnosis not present

## 2020-11-27 DIAGNOSIS — N251 Nephrogenic diabetes insipidus: Secondary | ICD-10-CM

## 2020-11-27 DIAGNOSIS — F4001 Agoraphobia with panic disorder: Secondary | ICD-10-CM

## 2020-11-27 NOTE — Progress Notes (Signed)
Laura Mathews [facility] [id number] [birth year] 40 y.o.    Subjective:   Patient ID:  Laura Mathews is a 40 y.o. (DOB Oct 18, 1980) female.  Chief Complaint:  Chief Complaint  Patient presents with  . Follow-up  . Schizoaffective disorder, bipolar type ([place])  . Depression    Medication Refill Associated symptoms include myalgias and neck pain. Pertinent negatives include no fatigue, headaches or weakness.  Anxiety Symptoms include decreased concentration. Patient reports no confusion, dizziness, nervous/anxious behavior or suicidal ideas.      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  When seen August 2020.  We reduced the Cytomel to 37.5 mcg bc of suppressed TSH.  She's noticed no problems.  At visit September 19, 2018.  At that visit we reduced Wellbutrin XL 300 to 150 because of anxiety and recently high blood pressure.    At her visit in April 2020.  She reported no problems from reducing the Wellbutrin and that her anxiety was somewhat better.  There were no changes made at the visit in April.  At visit October 2020.  No meds were changed.  Have gotten notice about [facility] not having access to loxapine which is a significant risk for this patient. She will try to access it.  Overall Loxapine has been very good to me but some movement disorder issues with toes moving, teeth grinding, Arm stiffness R, swallow involuntary movements.  seen May 25, 2019.  She remained on loxapine 300 mg.  Under stress she was still having hallucinations and she was given olanzapine 10 mg sublingual as needed auditory hallucinations.  She was continued on Wellbutrin XL 150 mg.  She was continued on Cytomel 37.5 mg every morning.  Also continued lithium 1200 mg daily and lamotrigine 200 mg daily. There was concern about the loxapine shortage and that she might have to be switched to another medication hopefully with low EPS  risk such as Fanapt.  But she has a history of orthostatic hypotension and it is unclear if she could tolerate that medication.  seen July 28, 2019.  Because of the shortage of loxapine we had to wean and discontinue that medication.  She was given Caplyta as an alternative antipsychotic given multiple other failures and her tendency to be very sensitive to EPS and tardive dyskinesia. Patient took it for about 3 weeks and then called February 1 stating that she was having nausea and involuntary facial movements and had found loxapine and wanted to return to it.  Therefore Caplyta was stopped and she was restarted on loxapine to quickly titrate back up to 300 mg nightly the same dose that she is taken in the past.  seen FEB 8 seen with her mother.  Ran out of Vardaman for 3 days bc pharmacy didn't have it.  Just back to 300 mg loxapine as of last night.  Slept fine on Caplyta with less duration needed. Mood pretty stable and not markedly depressed.  Today feels a ltttle psychotic with voices and feeling people are inside her a little confusion.  Movements from mouth have seemed better.  Has felt a little tight throat that scared her.  Mo notes facial grimacing and lip pursing has almost totally gone.   Mood really well overall with ECT reduced to once monthly. Reports getting "modified bilateral"  ECT.   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 Twin sister had baby boy .  Laura Mathews is doing well.  Applying for better job.  Some are in other states.  Not worrying about it.  Together for 13 years.    As of October 02, 2019, Really well overall.   Not manic and "not too psychotic".  Voices down to mostly noise.   Patient reports stable mood and denies depressed or irritable moods.  Anxiety is better and using clonazepam or Seroquel.    Walking more has helped and less depression improved motivation. Patient denies difficulty  with sleep initiation or maintenance. Denies appetite disturbance.  Patient reports that energy and motivation have been good.  Patient denies any difficulty with concentration.  Patient denies any suicidal ideation.  No recent panic.   Quiet noise. No med changes.  11/29/19 appt the following noted: Needs trazodone and Seroquel to sleep.  Rare olanzapine for AH.  Still trouble falling asleep with meds and can lay in bed for hours.  Will sleep late to make up for it.   Otherwise well without mania nor depression.  Occ psychotic sx with stressors.  Nephro will leave her on lithium and start amiloride for Nephrogenic Diabetes Insipidus.  Has started and will fu with labs in 2 weeks.Sx polydipsia and polyuria and thirst.  Couple of accidents and 1 in public.  Said renal function was good otherwise.  No SI in a long time.   Tutoring 3 hours weekly.  Enjoys it.   Laura Mathews is not planning a job change at this time.  Will sign lease for apt for another year. Plan: no med changes  05/07/2020 appointment with the following noted: Continued all meds.  Still doing maintenance ECT monthly. No olanzapine lately.  Only twice since had it.   For the most part don't hear voices unless under severe stress. Mood happy without depression. Doing deep breathing from DBT to help with anxiety.  I'm still an anxious person. 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   CO weight gain since here and thinks it's from using the Seroquel 50-100 mg  for sleep and now trying to use less. Sleep well with Seroquel and Klonopin.  SE EPS bc walking with arms bent and this is leading to some pain R >L.  Can straighten it out.  08/06/2020 appt with following noted: No olanzapine used in months. Well overall.  Mood pretty balanced now. No mania or depression now. Last had depression after severe UTI. Not sure when but anxiety worse in the am and hard to get off the couch until early afternoon.  No specific thoughts or  fears associated.   Still going to [employer] to meet friends. Not much caffeine 1 cup am.   Sleep really well.   Started Noom and lost 7 # so far.   Voices only problem when stressed.   Plan: Hold  Wellbutrin 150 for 1 week to see if anxiety is better .    10/02/2020 phone call from patient complaining of worsening depression with suicidal thoughts without intent. Nurse note:Rtc to pt and she reports having increased depression for about 1 week now, she had ECT on Friday 3/11 and will have ECT again this Friday the 18th. She has been waking up earlier then usual and unable to fall asleep, about an 1-1/2 hours early.  Asking if there is anything she can do to help? Suicidal thoughts but nothing specific or any intent MD response:Lithium level checked on 09/27/2020 was 1.0.  Therefore no room to increase that dosage. On sertraline 200 mg daily so no room to increase that dosage. At the last appointment, I asked her to hold the Wellbutrin for a week to see if her anxiety was better.  I do not know if she stopped it or restarted it.  If she stopped it have her restart the Wellbutrin XL 150 mg 1 AM.  If she is still taking it have her increase the Wellbutrin XL to 1-1/2 tablets every morning.  The bottle will say not to cut the tablets but because her dosage is low it is okay to cut the tablets if needed. Nurse contact with patient:Rtc to pt and she reports she did stop it but restarted it after 2 weeks, advised her to increase Wellbutrin XL to 1.5 tablets of 150 mg tablet. She will call back with worsening symptoms  10/22/2020 appointment with the following noted: Doing a lot better with depression.  Don't feel depressed anymore.  Had extra ECT 2 per week for 3 weeks. Ketamine used and triggered hallucinations for a couple of weeks which were visual shapes and not scary.   Feels like heart is beating too fast and hard to relax and harder to sleep. While off Wellbutrin anxiety feelings got better. Asks if  other treatment options exist for depression besides ECT. Mom on session says her memory is Terrible DT ECT. Getting modified bilateral ECT. Plan: In hopes of getting better control of depression with the eventual goal of stopping ECT we will make the following change, DC Wellbutrin  And start Caplyta 42 mg daily. .  11/27/2020 appointment with the following noted:  Seen with mother Patient hospitalized 11/20/2020 [facility] health care for suicidal ideation and depression. ECT 4/29.  Then dep with SI 5/3 and went to ER.  Had ECT and felt better on 11/22/20.   SI seemed to come on gradually.  A lot of stress lately.  Some voices lately.   They thought the ketamine was causing hallucinations and it was changed without full seizue and then switched to something else and then back to ketamine.   Depressed but not suicidal and no where near suicidal.   Doesn't remember any effect of switch to Caplyta.    No further near syncope for several mos.   SE overall been OK until lately some neck and back pain ? Related.    Started Accutane and no SE yet.    ECT still monthly.  Helped memory.  Asks about stretching it further.  However when she went 5-week intervals this last month she noticed more depression during the week preceding the ECT  Psychiatric medication history includes risperidone 3.5 mg EPS,  Abilify 30 mg withdrawal dyskinesia, perphenazine, Geodon, olanzapine, Saphris 5 mg, Haldol, Latuda 160, loxapine 300 mg, Seroquel constipation and U px,   lamotrigine, Depakote, lithium, clonazepam,  Fetzima, sertraline, Pristiq,, paroxetine,  citalopram, Lexapro,  Cytomel, topiramate, buspirone, gabapentin, trazodone propranolol with no response for anxiety benztropine ECT maintenance Under our psychiatric care since 2006.  Remote history of OD Klonopin.  Review of Systems:  Review of Systems  Constitutional: Positive for unexpected weight change. Negative for fatigue.  Endocrine: Positive for polyuria.   Genitourinary: Negative for urgency.  Musculoskeletal: Positive for back pain, myalgias and neck pain.  Neurological: Positive for tremors. Negative for dizziness, weakness and headaches.  Psychiatric/Behavioral: Positive for decreased concentration. Negative for agitation, behavioral problems, confusion, dysphoric mood, hallucinations, self-injury, sleep disturbance and suicidal ideas. The patient is not  nervous/anxious and is not hyperactive.     Medications: I have reviewed the patient's current medications.  Current Outpatient Medications  Medication Sig Dispense Refill  . aMILoride (MIDAMOR) 5 MG tablet Take 5 mg by mouth daily.    [name] b complex vitamins tablet Take 1 tablet by mouth daily.    . Cholecalciferol (VITAMIN D3) 5000 units CAPS Take 1 capsule by mouth daily.    . clonazePAM (KLONOPIN) 0.5 MG tablet TAKE 1 TABLET BY MOUTH TWICE DAILY, AND TAKE 2 TABLETS AT BEDTIME AS NEEDED 100 tablet 2  . lamoTRIgine (LAMICTAL) 200 MG tablet TAKE 1 TABLET DAILY 90 tablet 3  . liothyronine (CYTOMEL) 25 MCG tablet TAKE 1 AND 1/2 TABLETS EVERY MORNING 135 tablet 0  . lithium carbonate (LITHOBID) 300 MG CR tablet TAKE 1 AND 1/2 TABLETS IN  THE MORNING AND 2 TABLETS  IN THE EVENING 315 tablet 1  . loxapine (LOXITANE) 50 MG capsule TAKE 6 CAPSULES AT BEDTIME 540 capsule 1  . Lumateperone Tosylate (CAPLYTA) 42 MG CAPS Take 1 capsule by mouth daily. 30 capsule 1  . Melatonin 10 MG CAPS Take 10 mg by mouth.    . Omega-3 Fatty Acids (FISH OIL) 1200 MG CAPS Take by mouth.    . QUEtiapine (SEROQUEL) 50 MG tablet TAKE 1 TO 2 TABLETS AT     NIGHT FOR SLEEP 180 tablet 0  . sertraline (ZOLOFT) 100 MG tablet Take 2 tablets (200 mg total) by mouth daily. 180 tablet 1  . traZODone (DESYREL) 100 MG tablet Take 1 tablet (100 mg total) by mouth at bedtime. 90 tablet 1  . Acetylcysteine (NAC) 600 MG CAPS Take 600 mg by mouth 2 (two) times daily. (Patient not taking: No sig reported)    . benztropine (COGENTIN) 0.5  MG tablet TAKE 1-2 TABLETS TWICE     DAILY AS NEEDED FOR        EXTRAPYRAMIDAL SYMPTOMS (Patient not taking: Reported on 11/27/2020) 180 tablet 0  . fluticasone (FLONASE) 50 MCG/ACT nasal spray 1 spray by Each Nare route daily.    . Multiple Vitamins-Minerals (MULTIVITAMIN ADULT) CHEW Chew by mouth. (Patient not taking: No sig reported)    . pyridOXINE (VITAMIN B-6) 100 MG tablet Take 100 mg by mouth daily. (Patient not taking: No sig reported)    . vitamin E 1000 UNIT capsule Take 1,000 Units by mouth daily. (Patient not taking: No sig reported)     No current facility-administered medications for this visit.    Medication Side Effects: Other: tremor, grinding teeth daytime, blurred vision, mild right arm posturing, sleep 11 hours.  Not much daytime sleepiness. Stimulating SE Wellbutrin  Allergies:  Allergies  Allergen Reactions  . Metformin And Related   . Naltrexone     History reviewed. No pertinent past medical history.  History reviewed. No pertinent family history.  Social History   Socioeconomic History  . Marital status: Legally Separated    Spouse name: Not on file  . Number of children: Not on file  . Years of education: Not on file  . Highest education level: Not on file  Occupational History  . Not on file  Tobacco Use  . Smoking status: Never Smoker  . Smokeless tobacco: Never Used  Substance and Sexual Activity  . Alcohol use: Not on file  . Drug use: Not on file  . Sexual activity: Not on file  Other Topics Concern  . Not on file  Social History Narrative  . Not on file  Social Determinants of Health   Financial Resource Strain: Not on file  Food Insecurity: Not on file  Transportation Needs: Not on file  Physical Activity: Not on file  Stress: Not on file  Social Connections: Not on file  Intimate Partner Violence: Not on file    Past Medical History, Surgical history, Social history, and Family history were reviewed and updated as appropriate.    Please see review of systems for further details on the patient's review from today.   Objective:   Physical Exam:  There were no vitals taken for this visit.  Physical Exam Constitutional:      General: She is not in acute distress.    Appearance: She is well-developed.  Neurological:     Mental Status: She is alert and oriented to person, place, and time.     Cranial Nerves: No dysarthria.  Psychiatric:        Attention and Perception: Attention normal. She does not perceive auditory or visual hallucinations.        Mood and Affect: Mood is anxious and depressed. Affect is not labile, blunt, angry, tearful or inappropriate.        Speech: Speech normal. Speech is not slurred.        Behavior: Behavior normal. Behavior is cooperative.        Thought Content: Thought content normal. Thought content is not paranoid or delusional. Thought content does not include homicidal or suicidal ideation. Thought content does not include homicidal or suicidal plan.        Cognition and Memory: Cognition and memory normal.        Judgment: Judgment normal.     Lab Review:     Component Value Date/Time   NA 138 09/28/2019 0954   K 4.2 09/28/2019 0954   CL 103 09/28/2019 0954   CO2 23 09/28/2019 0954   GLUCOSE 93 09/28/2019 0954   GLUCOSE 99 10/10/2010 1150   BUN 16 09/28/2019 0954   CREATININE 0.81 09/28/2019 0954   CALCIUM 10.3 (H) 09/28/2019 0954   PROT 7.5 10/10/2010 0207   ALBUMIN 4.1 10/10/2010 0207   AST 23 10/10/2010 0207   ALT 19 10/10/2010 0207   ALKPHOS 40 10/10/2010 0207   BILITOT 0.4 10/10/2010 0207   GFRNONAA 94 09/28/2019 0954   GFRAA 108 09/28/2019 0954       Component Value Date/Time   WBC 16.7 (H) 10/10/2010 0207   RBC 4.50 10/10/2010 0207   HGB 12.7 04/14/2011 0956   HCT 40.0 10/10/2010 0223   PLT 225 10/10/2010 0207   MCV 86.7 10/10/2010 0207   MCH 28.7 10/10/2010 0207   MCHC 33.1 10/10/2010 0207   RDW 12.9 10/10/2010 0207   LYMPHSABS 3.3 10/10/2010  0207   MONOABS 1.7 (H) 10/10/2010 0207   EOSABS 0.4 10/10/2010 0207   BASOSABS 0.1 10/10/2010 0207    Lithium Lvl  Date Value Ref Range Status  09/28/2019 1.0 0.6 - 1.2 mmol/L Final    Comment:                                     Detection Limit = 0.1                           <0.1 indicates None Detected     TSH low on Cytomel in June 2020 No results found for: PHENYTOIN, PHENOBARB, VALPROATE,  CBMZ  l  Component 04/17/20 03/06/20 02/13/20 01/09/20 12/25/19 12/07/19  Lithium Lvl 1.2 1.2 1.4High 1.1 1.0 1.0   Component 04/17/20 03/06/20 02/13/20 01/09/20 12/25/19 12/07/19  Sodium 130Low 135 137 136 135 137  Potassium 4.3 4.1 4.0 4.2 4.1 4.1  Chloride 103 105 105 104 105 105  Anion Gap 4Low _0 CO2 23.0 24.9 26.1 27.2 23.5 26.7  BUN 7Low _1 Creatinine 0.88 0.84High 0.80 0.82High 0.72 0.77  BUN/Creatinine Ratio _2 EGFR CKD-EPI Non-African American, Female 84 89 >90 >90 >90 >90  EGFR CKD-EPI African American, Female >90 >90 >90 >90 >90 >90  Glucose 91 99 89 112 95 98  Calcium 10.3 10.9High 10.8High 11.1High 10.9High 10.7High  Albumin 4.5 4.6 4.6 4.1 4.3 4.4  Total Protein 7.6 -- 7.9 -- -- --  Total Bilirubin 0.5 -- 0.5 -- -- --  AST 19 -- 19 -- -- --  ALT 19 -- 18 -- -- --  Alkaline Phosphatase 67 -- 71 -- -- --   Component 04/17/20 02/13/20 01/09/20 12/25/19 12/07/19 11/01/19  Osmolality, Ur 69 220 117 109 199 198     07/17/20 05/20/20 04/17/20 03/06/20 02/13/20 02/13/20  Sodium 135 137 130Low 135 137 --  Potassium 4.2 4.9 4.3 4.1 4.0 --  Chloride 104 105 103 105 105 --  CO2 25.9 27.5 23.0 24.9 26.1 --  Anion Gap 5 5 4Low 5 6 --  BUN 15 14 7Low 15 14 --  Creatinine 0.71 0.93 0.88 0.84High 0.80 --  BUN/Creatinine Ratio _3 --  EGFR CKD-EPI Non-African American, Female >90 79 84 89 >90 --  EGFR CKD-EPI African American, Female >90 91 >90 >90 >90 --  Glucose 92 89 91 99 89 --  Calcium 10.7  9.9 10.3 10.9High 10.8High --  Phosphorus 2.7 -- -- 3.4 -- 3.5  Albumin 4.4 -- 4.5 4.6 4.6 --   Lithium level 07/17/2020 was 1.1 on the current dose of lithium. .res Assessment: Plan:    Kimie was seen today for follow-up, schizoaffective disorder, bipolar type ([facility]) and depression.  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  Burnette is chronically unstable and prone to extrapyramidal side effects complicating treatment. Very complicated patient on polypharmacy and receiving ECT.  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 Specifically the command hallucinations to kill her self have resolved. Better back on loxapine 300 mg daily.  She is not significantly depressed at this time.  She continues maintenance ECT  She has a pattern of marked anxiety in the morning.  She is able to go meet her friends at [facility] which is helpful.  Otherwise she finds her self sitting on the couch until the afternoon when the anxiety appears to lift some.  It might possibly be related to the Wellbutrin so we will hold that for a week to see if it is related.  She does not appear to  be drinking too much caffeine but she may be sensitive to caffeine in part due to the Wellbutrin.  The pattern of anxiety or depression being worse in the morning and better as the day progresses and is not an unusual pattern for organic depression  and anxiety and this was discussed.  She has failed to respond to some of the milder things for anxiety and we do not want to use a lot of benzodiazepine if we can avoid it because of cognitive reasons.  We may consider low-dose Luvox as it tends to have less mood cycling than other SSRIs.   Continue trial of Caplyta 42 mg daily. .    The hope is this will help bipolar depression enough that she could sing consider stopping ECT.  She and her mother are concerned about ECTs effect on her memory which affects her overall quality of life.  We cannot discontinue the loxapine at this time without worsening hallucinations.  However if the Caplyta is helpful for depression then we will attempt to gradually wean loxapine.  We have discussed that is unusual to use 2 antipsychotics together but in this case it is medically necessary.  Discussed side effects. Depression  better after extra ECT in March 2022.  For anxiety consider low dose Luvox which has less risk of mood cycling than other SSRIs usually and helps anxiety.  Extensive discussion of clozapine in detail.   Including the risk of severe neutropenia, marked weight gain, sedation, metabolic problems, cardiac risk, etc.  Discussed the need for weekly blood test for at least 6 months.  However we also discussed this is the most effective antipsychotic on the market.  It matter may better control her voices.  She wishes to defer.  Reduce Loxapine  With goal of going on Caplyta alone.  Caplyta might work better for depression without the loxapine. Reduce loxapine to 5 capsules daily for 2 weeks,  Then reduce to 4 capsules daily for 2 weeks,  Then reduce to 3 capsules daily. Disc risk increased AH.  Most effective option clozapine.  oPTION Fanapt.  She has had less orthostatic hypotension in the last year than she has had in previous years.  She may tolerate this better now and it is much easier to use than clozapine.  Her low vitamin D level is  supplemented and resolved with supplemental vitamin D.  Low Vitamin D increases the risk of depression  Option Ozempic or Saxenda for weight loss.  Disc in detail in previous visit  Counseled patient regarding potential benefits, risks, and side effects of lithium to include potential risk of lithium affecting thyroid and renal function.  Discussed need for periodic lab monitoring to determine drug level and to assess for potential adverse effects.  Counseled patient regarding signs and symptoms of lithium toxicity and advised that they notify office immediately or seek urgent medical attention if experiencing these signs and symptoms.  Patient advised to contact office with any questions or concerns. Being followed by nephro and labs inlcuding lithium levels noted above.   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 No indication to change bc lithium helps her stability so much.  Will continue to coordinate with nephrology; Labs from December reviewed and noted above.  No change indicated.  Follow-up 6 weeks  Lynder Parents, MD, DFAPA  Please see After Visit Summary for patient specific instructions.  Future Appointments  Date Time Provider [place]  02/17/2021  2:00 PM Cottle, Billey Co., MD  CP-CP None    No orders of the defined types were placed in this encounter.     -------------------------------

## 2020-11-27 NOTE — Telephone Encounter (Signed)
Laura Mathews called and said that she was recently in the hospital. She made an appt for 02/17/21.She would like to be seen sooner. Will you see her in a work in slot?

## 2020-11-27 NOTE — Telephone Encounter (Signed)
Please review

## 2020-11-27 NOTE — Patient Instructions (Addendum)
Reduce loxapine to 5 capsules daily for 2 weeks,  Then reduce to 4 capsules daily for 2 weeks,  Then reduce to 3 capsules daily.

## 2020-11-27 NOTE — Telephone Encounter (Signed)
Pt is coming in today after a cancellation. Disregard

## 2020-12-19 ENCOUNTER — Other Ambulatory Visit: Payer: Self-pay | Admitting: Psychiatry

## 2020-12-19 ENCOUNTER — Telehealth: Payer: Self-pay | Admitting: Psychiatry

## 2020-12-19 DIAGNOSIS — F250 Schizoaffective disorder, bipolar type: Secondary | ICD-10-CM

## 2020-12-19 MED ORDER — LAMOTRIGINE 200 MG PO TABS: 200.0000 mg | ORAL_TABLET | Freq: Every day | ORAL | 1 refills | Status: DC

## 2020-12-19 NOTE — Telephone Encounter (Signed)
Pt called having trouble submitting refill request for Lamotrigine on line to [facility]. Requesting CC to send Rx to [facility] for Lamotrigine.

## 2020-12-24 ENCOUNTER — Telehealth: Payer: Self-pay | Admitting: Psychiatry

## 2020-12-24 NOTE — Telephone Encounter (Signed)
She will call DM[employer] and let us know

## 2020-12-24 NOTE — Telephone Encounter (Signed)
Next visit is 02/17/21. Laura Mathews called and has been using a cane that her PT recommended due to extra pyramidal side effects. She said the cane has helped a lot. She wants to know if Dr. Jennelle Human could give her a form for a temporary or permanent handicap sticker because of the extra pyramidal side effects she is having. Her phone number is [phone].

## 2020-12-24 NOTE — Telephone Encounter (Signed)
I do not have the form.  If she gets the form I will sign it.

## 2020-12-24 NOTE — Telephone Encounter (Signed)
Please review

## 2020-12-28 ENCOUNTER — Other Ambulatory Visit: Payer: Self-pay | Admitting: Psychiatry

## 2020-12-28 DIAGNOSIS — F250 Schizoaffective disorder, bipolar type: Secondary | ICD-10-CM

## 2021-01-01 ENCOUNTER — Other Ambulatory Visit: Payer: Self-pay | Admitting: Psychiatry

## 2021-01-01 DIAGNOSIS — F5105 Insomnia due to other mental disorder: Secondary | ICD-10-CM

## 2021-01-01 DIAGNOSIS — F250 Schizoaffective disorder, bipolar type: Secondary | ICD-10-CM

## 2021-01-01 DIAGNOSIS — F411 Generalized anxiety disorder: Secondary | ICD-10-CM

## 2021-01-01 DIAGNOSIS — F4001 Agoraphobia with panic disorder: Secondary | ICD-10-CM

## 2021-01-02 ENCOUNTER — Telehealth: Payer: Self-pay | Admitting: Psychiatry

## 2021-01-02 ENCOUNTER — Other Ambulatory Visit: Payer: Self-pay | Admitting: Psychiatry

## 2021-01-02 DIAGNOSIS — F5105 Insomnia due to other mental disorder: Secondary | ICD-10-CM

## 2021-01-02 MED ORDER — TRAZODONE HCL 100 MG PO TABS: 100.0000 mg | ORAL_TABLET | Freq: Every day | ORAL | 0 refills | Status: DC

## 2021-01-02 NOTE — Telephone Encounter (Signed)
Please review

## 2021-01-02 NOTE — Telephone Encounter (Signed)
Next visit is 02/17/21. Laura Mathews called and said that she is waiting on her mail order on Trazodone from [facility]. Would it be possible for her to get a one month supply of Trazodone called in to [facility] as she is running low now. [address] [place], [place]. Phone number is [phone].

## 2021-01-13 ENCOUNTER — Other Ambulatory Visit: Payer: Self-pay | Admitting: Psychiatry

## 2021-01-13 DIAGNOSIS — F250 Schizoaffective disorder, bipolar type: Secondary | ICD-10-CM

## 2021-01-17 ENCOUNTER — Other Ambulatory Visit: Payer: Self-pay | Admitting: Psychiatry

## 2021-01-17 DIAGNOSIS — F250 Schizoaffective disorder, bipolar type: Secondary | ICD-10-CM

## 2021-01-21 ENCOUNTER — Telehealth: Payer: Self-pay | Admitting: Psychiatry

## 2021-01-21 NOTE — Telephone Encounter (Signed)
Pt mailed in a form for DMV that she has asked [place] to complete. It is for a handicapped placard. Placed in provider's box.

## 2021-01-23 ENCOUNTER — Telehealth: Payer: Self-pay | Admitting: Psychiatry

## 2021-01-23 NOTE — Telephone Encounter (Signed)
Pt did not go into detail but is having some personal issues with her partner.She is having increased depression and psychosis.She stated she is very tired all the time.She wants to know if anything can be done before her appt on 8/1.

## 2021-01-23 NOTE — Telephone Encounter (Signed)
Pt reports she is not doing well. She is supposed to go to [place] in about a week with her partner. She is wanting to review her medications. No appts available on Dr. Renaye Rakers schedule- please advise. She is still doing ECT treatments and has one Monday 7/11 in the am.

## 2021-01-25 ENCOUNTER — Other Ambulatory Visit: Payer: Self-pay | Admitting: Psychiatry

## 2021-01-25 DIAGNOSIS — F5105 Insomnia due to other mental disorder: Secondary | ICD-10-CM

## 2021-01-28 ENCOUNTER — Other Ambulatory Visit: Payer: Self-pay

## 2021-01-28 DIAGNOSIS — F4001 Agoraphobia with panic disorder: Secondary | ICD-10-CM

## 2021-01-28 DIAGNOSIS — F411 Generalized anxiety disorder: Secondary | ICD-10-CM

## 2021-01-28 MED ORDER — CLONAZEPAM 0.5 MG PO TABS: ORAL_TABLET | ORAL | 2 refills | Status: DC

## 2021-01-29 ENCOUNTER — Other Ambulatory Visit: Payer: Self-pay | Admitting: Psychiatry

## 2021-01-29 DIAGNOSIS — F411 Generalized anxiety disorder: Secondary | ICD-10-CM

## 2021-01-29 DIAGNOSIS — F4001 Agoraphobia with panic disorder: Secondary | ICD-10-CM

## 2021-01-29 MED ORDER — CLONAZEPAM 0.5 MG PO TABS: ORAL_TABLET | ORAL | 1 refills | Status: DC

## 2021-01-29 NOTE — Telephone Encounter (Signed)
Clonazepam sent.

## 2021-02-03 NOTE — Telephone Encounter (Signed)
I'm sorry there's nothing I can do before her apppt.

## 2021-02-17 ENCOUNTER — Telehealth (INDEPENDENT_AMBULATORY_CARE_PROVIDER_SITE_OTHER): Payer: Medicare Other | Admitting: Psychiatry

## 2021-02-17 ENCOUNTER — Encounter: Payer: Self-pay | Admitting: Psychiatry

## 2021-02-17 DIAGNOSIS — G251 Drug-induced tremor: Secondary | ICD-10-CM

## 2021-02-17 DIAGNOSIS — F250 Schizoaffective disorder, bipolar type: Secondary | ICD-10-CM | POA: Diagnosis not present

## 2021-02-17 DIAGNOSIS — G2589 Other specified extrapyramidal and movement disorders: Secondary | ICD-10-CM | POA: Diagnosis not present

## 2021-02-17 DIAGNOSIS — F411 Generalized anxiety disorder: Secondary | ICD-10-CM | POA: Diagnosis not present

## 2021-02-17 DIAGNOSIS — F5105 Insomnia due to other mental disorder: Secondary | ICD-10-CM

## 2021-02-17 DIAGNOSIS — F4001 Agoraphobia with panic disorder: Secondary | ICD-10-CM | POA: Diagnosis not present

## 2021-02-17 DIAGNOSIS — Z79899 Other long term (current) drug therapy: Secondary | ICD-10-CM

## 2021-02-17 MED ORDER — AMANTADINE HCL 100 MG PO CAPS
100.0000 mg | ORAL_CAPSULE | Freq: Two times a day (BID) | ORAL | 1 refills | Status: DC
Start: 2021-02-17 — End: 2021-03-18

## 2021-02-17 NOTE — Progress Notes (Signed)
Laura Mathews [id number] [birth year] 40 y.o.   Video Visit via My Chart  I connected with pt by My Chart and verified that I am speaking with the correct person using two identifiers.   I discussed the limitations, risks, security and privacy concerns of performing an evaluation and management service by My Chart  and the availability of in person appointments. I also discussed with the patient that there may be a patient responsible charge related to this service. The patient expressed understanding and agreed to proceed.  I discussed the assessment and treatment plan with the patient. The patient was provided an opportunity to ask questions and all were answered. The patient agreed with the plan and demonstrated an understanding of the instructions.   The patient was advised to call back or seek an in-person evaluation if the symptoms worsen or if the condition fails to improve as anticipated.  I provided 30 minutes of video time during this encounter.  The patient was located at home and the provider was located office. Session [id number]  Subjective:   Patient ID:  Laura Mathews is a 40 y.o. (DOB [birth year]) female.  Chief Complaint:  Chief Complaint  Patient presents with   Follow-up   Schizoaffective disorder, bipolar type ([place])   Medication Problem    EPS      Laura Mathews presents  today for follow-up of schizoaffective depression with hallucinations and anxiety.  When seen August 2020.  We reduced the Cytomel to 37.5 mcg bc of suppressed TSH.  She's noticed no problems.  At visit September 19, 2018.  At that visit we reduced Wellbutrin XL 300 to 150 because of anxiety and recently high blood pressure.    At her visit in April 2020.  She reported no problems from reducing the Wellbutrin and that her anxiety was somewhat better.  There were no changes made at the visit in April.  At visit October 2020.  No meds were changed.  Have gotten notice about  [facility] not having access to loxapine which is a significant risk for this patient. She will try to access it.  Overall Loxapine has been very good to me but some movement disorder issues with toes moving, teeth grinding, Arm stiffness R, swallow involuntary movements.  seen May 25, 2019.  She remained on loxapine 300 mg.  Under stress she was still having hallucinations and she was given olanzapine 10 mg sublingual as needed auditory hallucinations.  She was continued on Wellbutrin XL 150 mg.  She was continued on Cytomel 37.5 mg every morning.  Also continued lithium 1200 mg daily and lamotrigine 200 mg daily. There was concern about the loxapine shortage and that she might have to be switched to another medication hopefully with low EPS risk such as Fanapt.  But she has a history of orthostatic hypotension and it is unclear if she could tolerate that medication.  seen July 28, 2019.  Because of the shortage of loxapine we had to wean and discontinue that medication.  She was given Caplyta as an alternative antipsychotic given multiple other failures and her tendency to be very sensitive to EPS and tardive dyskinesia. Patient took it for about 3 weeks and then called February 1 stating that she was having nausea and involuntary facial movements and had found loxapine and wanted to return to it.  Therefore Caplyta was stopped and she was restarted on loxapine to quickly titrate back up to 300 mg nightly the same dose that she is  taken in the past.  seen FEB 8 seen with her mother.  Ran out of [place] for 3 days bc pharmacy didn't have it.  Just back to 300 mg loxapine as of last night.  Slept fine on Caplyta with less duration needed. Mood pretty stable and not markedly depressed.  Today feels a ltttle psychotic with voices and feeling people are inside her a little confusion.  Movements from mouth have seemed better.  Has felt a little tight throat that scared her.  Mo notes facial grimacing and lip  pursing has almost totally gone.   Mood really well overall with ECT reduced to once monthly. Reports getting "modified bilateral"  ECT.   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 Twin sister had baby boy .  Laura Mathews is doing well.  Applying for better job.  Some are in other states.  Not worrying about it.  Together for 13 years.    As of October 02, 2019, Really well overall.   Not manic and "not too psychotic".  Voices down to mostly noise.   Patient reports stable mood and denies depressed or irritable moods.  Anxiety is better and using clonazepam or Seroquel.    Walking more has helped and less depression improved motivation. Patient denies difficulty with sleep initiation or maintenance. Denies appetite disturbance.  Patient reports that energy and motivation have been good.  Patient denies any difficulty with concentration.  Patient denies any suicidal ideation.  No recent panic.   Quiet noise. No med changes.  11/29/19 appt the following noted: Needs trazodone and Seroquel to sleep.  Rare olanzapine for AH.  Still trouble falling asleep with meds and can lay in bed for hours.  Will sleep late to make up for it.   Otherwise well without mania nor depression.  Occ psychotic sx with stressors.  Nephro will leave her on lithium and start amiloride for Nephrogenic Diabetes Insipidus.  Has started and will fu with labs in 2 weeks.Sx polydipsia and polyuria and thirst.  Couple of accidents and 1 in public.  Said renal function was good otherwise.  No SI in a long time.   Tutoring 3 hours weekly.  Enjoys it.   Laura Mathews is not planning a job change at this time.  Will sign lease for apt for another year. Plan: no med changes  05/07/2020 appointment with the following noted: Continued all meds.  Still doing maintenance ECT monthly. No olanzapine lately.  Only twice since had it.   For the most part don't hear voices  unless under severe stress. Mood happy without depression. Doing deep breathing from DBT to help with anxiety.  I'm still an anxious person. 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   CO weight gain since here and thinks it's from using the Seroquel 50-100 mg  for sleep and now trying to use less. Sleep well with Seroquel and Klonopin.  SE EPS bc walking with arms bent and this is leading to some pain R >L.  Can straighten it out.  08/06/2020 appt with following noted: No olanzapine used in months. Well overall.  Mood pretty balanced now. No mania or depression now. Last had depression after severe UTI. Not sure when but anxiety worse in the am and hard to get off the couch until early afternoon.  No specific thoughts or fears associated.   Still going to [employer] to  meet friends. Not much caffeine 1 cup am.   Sleep really well.   Started Noom and lost 7 # so far.   Voices only problem when stressed.   Plan: Hold  Wellbutrin 150 for 1 week to see if anxiety is better .    10/02/2020 phone call from patient complaining of worsening depression with suicidal thoughts without intent. Nurse note:Rtc to pt and she reports having increased depression for about 1 week now, she had ECT on Friday 3/11 and will have ECT again this Friday the 18th. She has been waking up earlier then usual and unable to fall asleep, about an 1-1/2 hours early.  Asking if there is anything she can do to help? Suicidal thoughts but nothing specific or any intent MD response:Lithium level checked on 09/27/2020 was 1.0.  Therefore no room to increase that dosage. On sertraline 200 mg daily so no room to increase that dosage. At the last appointment, I asked her to hold the Wellbutrin for a week to see if her anxiety was better.  I do not know if she stopped it or restarted it.  If she stopped it have her restart the Wellbutrin XL 150 mg 1 AM.  If she is still taking it have her increase the Wellbutrin XL to  1-1/2 tablets every morning.  The bottle will say not to cut the tablets but because her dosage is low it is okay to cut the tablets if needed. Nurse contact with patient:Rtc to pt and she reports she did stop it but restarted it after 2 weeks, advised her to increase Wellbutrin XL to 1.5 tablets of 150 mg tablet. She will call back with worsening symptoms  10/22/2020 appointment with the following noted: Doing a lot better with depression.  Don't feel depressed anymore.  Had extra ECT 2 per week for 3 weeks. Ketamine used and triggered hallucinations for a couple of weeks which were visual shapes and not scary.   Feels like heart is beating too fast and hard to relax and harder to sleep. While off Wellbutrin anxiety feelings got better. Asks if other treatment options exist for depression besides ECT. Mom on session says her memory is Terrible DT ECT. Getting modified bilateral ECT. Plan: In hopes of getting better control of depression with the eventual goal of stopping ECT we will make the following change, DC Wellbutrin  And start Caplyta 42 mg daily. .  11/27/2020 appointment with the following noted:  Seen with mother Patient hospitalized 11/20/2020 [facility] health care for suicidal ideation and depression. ECT 4/29.  Then dep with SI 5/3 and went to ER.  Had ECT and felt better on 11/22/20.   SI seemed to come on gradually.  A lot of stress lately.  Some voices lately.   They thought the ketamine was causing hallucinations and it was changed without full seizue and then switched to something else and then back to ketamine.   Depressed but not suicidal and no where near suicidal.   Doesn't remember any effect of switch to Caplyta.   Plan: Reduce Loxapine  With goal of going on Caplyta alone.  Caplyta might work better for depression without the loxapine. Reduce loxapine to 5 capsules daily for 2 weeks,  Then reduce to 4 capsules daily for 2 weeks,  Then reduce to 3 capsules daily. Disc risk  increased AH.  02/17/2021 appointment with the following noted: BF David on the session Got psychotic off loxapine and had to raise dose to 300  mg HS.  No hospitalization. Stopped Caplyta 3 days ago.  Has had excessive sleepiness.   Get lightheaded and dizzy and feels exhausted.  BF says she's collapsed at times. Some of the time she feels anxious but not all the time. Better mood in [place] last couple of weeks.  Has had a lot of depression in the last few weeks before the trip. Still some tiredness.   CO stiff arms walking and hurts to walk. Likes loxapine with control voices generally.  SE overall been OK until lately some neck and back pain ? Related.    Started Accutane and no SE yet.    ECT still monthly.  Helped memory.  Asks about stretching it further.  However when she went 5-week intervals this last month she noticed more depression during the week preceding the ECT  Psychiatric medication history includes risperidone 3.5 mg EPS,  Abilify 30 mg withdrawal dyskinesia, perphenazine, Geodon, olanzapine, Saphris 5 mg, Haldol, Latuda 160, loxapine 300 mg, Seroquel constipation and U px,   Caplyta NR lamotrigine, Depakote, lithium, clonazepam,  Fetzima, sertraline, Pristiq, paroxetine,  citalopram, Lexapro,  Cytomel, topiramate, buspirone, gabapentin, trazodone  propranolol with no response for anxiety benztropine ECT maintenance Under our psychiatric care since 2006.  Remote history of OD Klonopin.  Review of Systems:  Review of Systems  Constitutional:  Positive for unexpected weight change. Negative for fatigue.  Endocrine: Positive for polyuria.  Genitourinary:  Negative for urgency.  Musculoskeletal:  Positive for back pain, myalgias and neck pain.  Neurological:  Positive for tremors. Negative for dizziness, weakness and headaches.  Psychiatric/Behavioral:  Positive for decreased concentration. Negative for agitation, behavioral problems, confusion, dysphoric mood,  hallucinations, self-injury, sleep disturbance and suicidal ideas. The patient is not nervous/anxious and is not hyperactive.    Medications: I have reviewed the patient's current medications.  Current Outpatient Medications  Medication Sig Dispense Refill   Acetylcysteine (NAC) 600 MG CAPS Take 600 mg by mouth 2 (two) times daily.     amantadine (SYMMETREL) 100 MG capsule Take 1 capsule (100 mg total) by mouth 2 (two) times daily. 60 capsule 1   aMILoride (MIDAMOR) 5 MG tablet Take 5 mg by mouth daily.     b complex vitamins tablet Take 1 tablet by mouth daily.     Cholecalciferol (VITAMIN D3) 5000 units CAPS Take 1 capsule by mouth daily.     clonazePAM (KLONOPIN) 0.5 MG tablet TAKE 1 TABLET BY MOUTH TWICE DAILY, AND TAKE 2 TABLETS AT BEDTIME AS NEEDED 60 tablet 1   lamoTRIgine (LAMICTAL) 200 MG tablet Take 1 tablet (200 mg total) by mouth daily. 90 tablet 1   liothyronine (CYTOMEL) 25 MCG tablet TAKE 1 AND 1/2 TABLETS BY MOUTH EVERY MORNING 135 tablet 0   lithium carbonate (LITHOBID) 300 MG CR tablet TAKE 1 AND 1/2 TABLETS IN  THE MORNING AND 2 TABLETS  IN THE EVENING 315 tablet 1   loxapine (LOXITANE) 50 MG capsule TAKE 6 CAPSULES AT BEDTIME 540 capsule 1   Melatonin 10 MG CAPS Take 10 mg by mouth.     Omega-3 Fatty Acids (FISH OIL) 1200 MG CAPS Take by mouth.     QUEtiapine (SEROQUEL) 50 MG tablet TAKE 1 TO 2 TABLETS AT     NIGHT FOR SLEEP 180 tablet 0   sertraline (ZOLOFT) 100 MG tablet TAKE 2 TABLETS(200MG TOTAL)DAILY 180 tablet 1   traZODone (DESYREL) 100 MG tablet TAKE 1 TABLET BY MOUTH EVERYDAY AT BEDTIME 90 tablet 1  fluticasone (FLONASE) 50 MCG/ACT nasal spray 1 spray by Each Nare route daily.     Multiple Vitamins-Minerals (MULTIVITAMIN ADULT) CHEW Chew by mouth. (Patient not taking: No sig reported)     pyridOXINE (VITAMIN B-6) 100 MG tablet Take 100 mg by mouth daily. (Patient not taking: No sig reported)     vitamin E 1000 UNIT capsule Take 1,000 Units by mouth daily.  (Patient not taking: No sig reported)     No current facility-administered medications for this visit.    Medication Side Effects: Other: tremor , grinding teeth daytime, blurred vision, mild right arm posturing, sleep 11 hours.  Not much daytime sleepiness. Stimulating SE Wellbutrin  Allergies:  Allergies  Allergen Reactions   Metformin And Related    Naltrexone     History reviewed. No pertinent past medical history.  History reviewed. No pertinent family history.  Social History   Socioeconomic History   Marital status: Legally Separated    Spouse name: Not on file   Number of children: Not on file   Years of education: Not on file   Highest education level: Not on file  Occupational History   Not on file  Tobacco Use   Smoking status: Never   Smokeless tobacco: Never  Substance and Sexual Activity   Alcohol use: Not on file   Drug use: Not on file   Sexual activity: Not on file  Other Topics Concern   Not on file  Social History Narrative   Not on file   Social Determinants of Health   Financial Resource Strain: Not on file  Food Insecurity: Not on file  Transportation Needs: Not on file  Physical Activity: Not on file  Stress: Not on file  Social Connections: Not on file  Intimate Partner Violence: Not on file    Past Medical History, Surgical history, Social history, and Family history were reviewed and updated as appropriate.   Please see review of systems for further details on the patient's review from today.   Objective:   Physical Exam:  There were no vitals taken for this visit.  Physical Exam Constitutional:      General: She is not in acute distress.    Appearance: She is well-developed.  Neurological:     Mental Status: She is alert and oriented to person, place, and time.     Cranial Nerves: No dysarthria.  Psychiatric:        Attention and Perception: Attention normal. She does not perceive auditory or visual hallucinations.         Mood and Affect: Mood is anxious and depressed. Affect is not labile, blunt, angry, tearful or inappropriate.        Speech: Speech normal. Speech is not slurred.        Behavior: Behavior normal. Behavior is cooperative.        Thought Content: Thought content normal. Thought content is not paranoid or delusional. Thought content does not include homicidal or suicidal ideation. Thought content does not include homicidal or suicidal plan.        Cognition and Memory: Cognition and memory normal.        Judgment: Judgment normal.    Lab Review:     Component Value Date/Time   NA 138 09/28/2019 0954   K 4.2 09/28/2019 0954   CL 103 09/28/2019 0954   CO2 23 09/28/2019 0954   GLUCOSE 93 09/28/2019 0954   GLUCOSE 99 10/10/2010 1150   BUN 16 09/28/2019 0954  CREATININE 0.81 09/28/2019 0954   CALCIUM 10.3 (H) 09/28/2019 0954   PROT 7.5 10/10/2010 0207   ALBUMIN 4.1 10/10/2010 0207   AST 23 10/10/2010 0207   ALT 19 10/10/2010 0207   ALKPHOS 40 10/10/2010 0207   BILITOT 0.4 10/10/2010 0207   GFRNONAA 94 09/28/2019 0954   GFRAA 108 09/28/2019 0954       Component Value Date/Time   WBC 16.7 (H) 10/10/2010 0207   RBC 4.50 10/10/2010 0207   HGB 12.7 04/14/2011 0956   HCT 40.0 10/10/2010 0223   PLT 225 10/10/2010 0207   MCV 86.7 10/10/2010 0207   MCH 28.7 10/10/2010 0207   MCHC 33.1 10/10/2010 0207   RDW 12.9 10/10/2010 0207   LYMPHSABS 3.3 10/10/2010 0207   MONOABS 1.7 (H) 10/10/2010 0207   EOSABS 0.4 10/10/2010 0207   BASOSABS 0.1 10/10/2010 0207    Lithium Lvl  Date Value Ref Range Status  09/28/2019 1.0 0.6 - 1.2 mmol/L Final    Comment:                                     Detection Limit = 0.1                           <0.1 indicates None Detected     TSH low on Cytomel in June 2020 No results found for: PHENYTOIN, PHENOBARB, VALPROATE, CBMZ  l  Component 04/17/20 03/06/20 02/13/20 01/09/20 12/25/19 12/07/19  Lithium Lvl 1.2 1.2 1.4 High  1.1 1.0 1.0   Component  04/17/20 03/06/20 02/13/20 01/09/20 12/25/19 12/07/19  Sodium 130 Low  135 137 136 135 137  Potassium 4.3 4.1 4.0 4.2 4.1 4.1  Chloride 103 105 105 104 105 105  Anion Gap 4 Low  '5 6 5 7 5  ' CO2 23.0 24.9 26.1 27.2 23.5 26.7  BUN 7 Low  '15 14 18 16 16  ' Creatinine 0.88 0.84 High  0.80 0.82 High  0.72 0.77  BUN/Creatinine Ratio '8 18 18 22 22 21  ' EGFR CKD-EPI Non-African American, Female 84 89 >90 >90 >90 >90  EGFR CKD-EPI African American, Female >90 >90 >90 >90 >90 >90  Glucose 91 99 89 112 95 98  Calcium 10.3 10.9 High  10.8 High  11.1 High  10.9 High  10.7 High   Albumin 4.5 4.6 4.6 4.1 4.3 4.4  Total Protein 7.6 -- 7.9 -- -- --  Total Bilirubin 0.5 -- 0.5 -- -- --  AST 19 -- 19 -- -- --  ALT 19 -- 18 -- -- --  Alkaline Phosphatase 67 -- 71 -- -- --   Component 04/17/20 02/13/20 01/09/20 12/25/19 12/07/19 11/01/19  Osmolality, Ur 69 220 117 109 199 198     07/17/20 05/20/20 04/17/20 03/06/20 02/13/20 02/13/20  Sodium 135 137 130 Low  135 137 --  Potassium 4.2 4.9 4.3 4.1 4.0 --  Chloride 104 105 103 105 105 --  CO2 25.9 27.5 23.0 24.9 26.1 --  Anion Gap '5 5 4 ' Low  5 6 --  BUN '15 14 7 ' Low  15 14 --  Creatinine 0.71 0.93 0.88 0.84 High  0.80 --  BUN/Creatinine Ratio '21 15 8 18 18 ' --  EGFR CKD-EPI Non-African American, Female >90 79 84 89 >90 --  EGFR CKD-EPI African American, Female >90 91 >90 >90 >90 --  Glucose 92 89 91 99  89 --  Calcium 10.7 9.9 10.3 10.9 High  10.8 High  --  Phosphorus 2.7 -- -- 3.4 -- 3.5  Albumin 4.4 -- 4.5 4.6 4.6 --   Lithium level 07/17/2020 was 1.1 on the current dose of lithium. .res Assessment: Plan:    Karlen was seen today for follow-up, schizoaffective disorder, bipolar type ([facility]) and medication problem.  Diagnoses and all orders for this visit:  Schizoaffective disorder, bipolar type ([place])  Generalized anxiety disorder  Panic disorder with agoraphobia  Extrapyramidal movement disorder, drug-induced -     amantadine (SYMMETREL)  100 MG capsule; Take 1 capsule (100 mg total) by mouth 2 (two) times daily.  Insomnia due to mental condition  Lithium-induced tremor  Lithium use   Greater than 50% of 45 minutes face to face time with patient was spent on counseling and coordination of care.  Janisse is chronically unstable and prone to extrapyramidal side effects complicating treatment. Very complicated patient on polypharmacy and receiving ECT.  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 Specifically the command hallucinations to kill her self have resolved. Better back on loxapine 300 mg daily.  EPS problems: Trial amantadine 100 mg BID  She is not significantly depressed at this time but has been lately.  She continues maintenance ECT  She has a pattern of marked anxiety in the morning.  She is able to go meet her friends at [facility] which is helpful.  Otherwise she finds her self sitting on the couch until the afternoon when the anxiety appears to lift some.  It might possibly be related to the Wellbutrin so we will hold that for a week to see if it is related.  She does not appear to be drinking too much caffeine but she may be sensitive to caffeine in part due to the Wellbutrin.  The pattern of anxiety or depression being worse in the morning and better as the day progresses and is not an unusual pattern for organic depression and anxiety and this was discussed.  She has failed to respond to some of the milder things for anxiety and we do not want to use a lot of benzodiazepine if we can avoid it because of cognitive reasons.  We may consider low-dose Luvox as it tends to have less mood cycling than other SSRIs.   Failed trial of Caplyta 42 mg daily. .    The hope is this will help bipolar  depression enough that she could sing consider stopping ECT.  She and her mother are concerned about ECTs effect on her memory which affects her overall quality of life.  We cannot discontinue the loxapine at this time without worsening hallucinations.  However if the Caplyta is helpful for depression then we will attempt to gradually wean loxapine.  We have discussed that is unusual to use 2 antipsychotics together but in this case it is medically necessary.  Discussed side effects. Depression  better after extra ECT in March 2022.  For anxiety consider low dose Luvox which has less risk of mood cycling than other SSRIs usually and helps anxiety.  Extensive discussion of clozapine in detail.   Including the risk of severe neutropenia, marked weight gain, sedation, metabolic problems, cardiac risk, etc.  Discussed the need for weekly blood test for at least 6 months.  However we also discussed  this is the most effective antipsychotic on the market.  It matter may better control her voices.  She wishes to defer.  Failed attempt to wean loxapine. Continue 300 mg daily. Add salt tablets to each meal to stop orthostasis  Most effective option clozapine.    Her low vitamin D level is supplemented and resolved with supplemental vitamin D.  Low Vitamin D increases the risk of depression  Option Ozempic or Saxenda for weight loss.  Disc in detail in previous visit. Disc  Counseled patient regarding potential benefits, risks, and side effects of lithium to include potential risk of lithium affecting thyroid and renal function.  Discussed need for periodic lab monitoring to determine drug level and to assess for potential adverse effects.  Counseled patient regarding signs and symptoms of lithium toxicity and advised that they notify office immediately or seek urgent medical attention if experiencing these signs and symptoms.  Patient advised to contact office with any questions or concerns. Being followed by  nephro and labs inlcuding lithium levels noted above.   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 No indication to change bc lithium helps her stability so much.  Will continue to coordinate with nephrology;  Follow-up 6 weeks  Lynder Parents, MD, DFAPA  Please see After Visit Summary for patient specific instructions.  No future appointments.   No orders of the defined types were placed in this encounter.     -------------------------------

## 2021-02-28 ENCOUNTER — Other Ambulatory Visit: Payer: Self-pay | Admitting: Psychiatry

## 2021-02-28 DIAGNOSIS — F250 Schizoaffective disorder, bipolar type: Secondary | ICD-10-CM

## 2021-03-04 ENCOUNTER — Encounter: Payer: Self-pay | Admitting: Psychiatry

## 2021-03-04 ENCOUNTER — Telehealth: Payer: Self-pay | Admitting: Psychiatry

## 2021-03-04 NOTE — Telephone Encounter (Signed)
OK to stop amantadine.  Make sure she RS appt also.

## 2021-03-04 NOTE — Telephone Encounter (Signed)
Laura Mathews is feeling like she is weaker and passing out more with the most recent medication change.  She would like to come off the medication.

## 2021-03-04 NOTE — Telephone Encounter (Signed)
Pt has been taking amantadine 100 mg BID for a couple weeks and it is causing her to feel dizzy and fuzzy.She stated she is passing out more and wants to stop the medication.

## 2021-03-05 NOTE — Telephone Encounter (Signed)
Received after hours call from pt who reports that dizziness has continued and she would like to stop or taper off Amantadine. Relayed to pt that Dr. Jennelle Human had advised that she could stop Amantadine without needing to taper and that he also recommended she RS. Pt verbalized understanding.

## 2021-03-06 NOTE — Telephone Encounter (Signed)
reviewed

## 2021-03-13 ENCOUNTER — Telehealth: Payer: Self-pay | Admitting: Adult Health

## 2021-03-13 NOTE — Telephone Encounter (Signed)
Do you want her to restart the Amantadine?

## 2021-03-13 NOTE — Telephone Encounter (Signed)
Received call from after hours service indicating patient had "akathesia". Returned call to patient - indicated symptoms worsening since d/c of Amantadine. Felt like the Amantadine was making her feel "sick: - now realizes it was not the Amantadine. Had taken 2 Clonazepam for symptom relief, but was still symptomatic. Advised to also take a Benadryl for the night. She call call this morning for further instructions. Denied SI or HI. Denies AH or VH. Felt safe in current environment/situation.

## 2021-03-18 ENCOUNTER — Other Ambulatory Visit: Payer: Self-pay | Admitting: Psychiatry

## 2021-03-18 DIAGNOSIS — G2589 Other specified extrapyramidal and movement disorders: Secondary | ICD-10-CM

## 2021-03-21 NOTE — Telephone Encounter (Signed)
She can try amantadine 1 daily to see if it helps the akathisia.

## 2021-03-25 NOTE — Telephone Encounter (Signed)
LVM with info and to rtc with any questions  

## 2021-04-02 ENCOUNTER — Ambulatory Visit: Payer: Medicare Other | Admitting: Psychiatry

## 2021-04-05 ENCOUNTER — Other Ambulatory Visit: Payer: Self-pay | Admitting: Psychiatry

## 2021-04-05 DIAGNOSIS — F250 Schizoaffective disorder, bipolar type: Secondary | ICD-10-CM

## 2021-04-10 ENCOUNTER — Other Ambulatory Visit: Payer: Self-pay | Admitting: Psychiatry

## 2021-04-10 DIAGNOSIS — G2589 Other specified extrapyramidal and movement disorders: Secondary | ICD-10-CM

## 2021-04-10 DIAGNOSIS — T50905A Adverse effect of unspecified drugs, medicaments and biological substances, initial encounter: Secondary | ICD-10-CM

## 2021-04-18 ENCOUNTER — Telehealth: Payer: Self-pay | Admitting: Psychiatry

## 2021-04-18 NOTE — Telephone Encounter (Signed)
Pt called and said that she is experiencing a side effect where her muscles move all the time. Amantadine that she takes for that is not working anymore.Please call her at [phone] and let her know what to do

## 2021-04-18 NOTE — Telephone Encounter (Signed)
Pt stated the amantadine is no longer helping with akathisia.She wants to know what can be done to help with involuntary muscle movement.

## 2021-04-21 NOTE — Telephone Encounter (Signed)
Have her continue the amantadine and add B6 500 mg twice daily.  She can get that off of [employer].  I learned that the conference this weekend that B6 can help akathisia.

## 2021-04-21 NOTE — Telephone Encounter (Signed)
Pt informed

## 2021-04-25 ENCOUNTER — Telehealth: Payer: Self-pay | Admitting: Psychiatry

## 2021-04-25 NOTE — Telephone Encounter (Signed)
Pt LVM that she was in the emergency room a few days ago because of trouble breathing.  ER Dr said nothing serious but it might have been meds she is on.  She wanted to know Dr. Alwyn Ren thoughts.

## 2021-04-25 NOTE — Telephone Encounter (Signed)
Please review

## 2021-04-25 NOTE — Telephone Encounter (Signed)
Next appt 11/22

## 2021-04-28 NOTE — Telephone Encounter (Signed)
Shortness of breath is not a particularly common side effect with any psychiatric medication.  I do not know of any particular change to the dressed this.  This kind of problem is best addressed through her primary care doctor's office because they can pursue various tests to evaluate this beyond even what the emergency room can do.

## 2021-04-29 NOTE — Telephone Encounter (Signed)
Pt stated symptoms have resolved.

## 2021-05-02 ENCOUNTER — Other Ambulatory Visit: Payer: Self-pay

## 2021-05-02 ENCOUNTER — Telehealth: Payer: Self-pay | Admitting: Psychiatry

## 2021-05-02 DIAGNOSIS — F4001 Agoraphobia with panic disorder: Secondary | ICD-10-CM

## 2021-05-02 DIAGNOSIS — F411 Generalized anxiety disorder: Secondary | ICD-10-CM

## 2021-05-02 MED ORDER — CLONAZEPAM 0.5 MG PO TABS: ORAL_TABLET | ORAL | 0 refills | Status: DC

## 2021-05-02 NOTE — Telephone Encounter (Signed)
Pended.

## 2021-05-02 NOTE — Telephone Encounter (Signed)
Patient called in for refill on Klonopin 0.5mg . Appt 11/22. Phone [phone]. Pharmacy [facility] [address] [place], [place]

## 2021-05-07 ENCOUNTER — Other Ambulatory Visit: Payer: Self-pay | Admitting: Psychiatry

## 2021-05-07 DIAGNOSIS — T50905A Adverse effect of unspecified drugs, medicaments and biological substances, initial encounter: Secondary | ICD-10-CM

## 2021-05-07 DIAGNOSIS — G2589 Other specified extrapyramidal and movement disorders: Secondary | ICD-10-CM

## 2021-05-22 ENCOUNTER — Other Ambulatory Visit: Payer: Self-pay | Admitting: Psychiatry

## 2021-05-22 DIAGNOSIS — F5105 Insomnia due to other mental disorder: Secondary | ICD-10-CM

## 2021-05-22 DIAGNOSIS — F250 Schizoaffective disorder, bipolar type: Secondary | ICD-10-CM

## 2021-05-26 ENCOUNTER — Other Ambulatory Visit: Payer: Self-pay | Admitting: Psychiatry

## 2021-05-26 DIAGNOSIS — F250 Schizoaffective disorder, bipolar type: Secondary | ICD-10-CM

## 2021-05-26 NOTE — Telephone Encounter (Signed)
Please review message from pharmacist

## 2021-05-28 NOTE — Telephone Encounter (Signed)
Pharmacy is objecting to the tablets being cut.  There is no danger to the tablets being cut but the pharmacy is not wanting to fill the prescription written as 1-1/2 in the morning and 2 in the evening.  Will write the prescription as to twice a day.  But call the patient and tell her to continue 1-1/2 in the morning and 2 at night in order to avoid lithium toxicity.

## 2021-05-29 ENCOUNTER — Telehealth: Payer: Self-pay

## 2021-05-29 NOTE — Telephone Encounter (Signed)
Contacted pt with clarification on her dosing of Lithium, she verbalized understanding and has been taking that dose.

## 2021-05-29 NOTE — Telephone Encounter (Signed)
-----   Message from Lauraine Rinne., MD sent at 05/28/2021 11:28 AM EST ----- Regarding: Lithium dosage Pharmacy is objecting to the tablets being cut.  There is no danger to the tablets being cut but the pharmacy is not wanting to fill the prescription written as 1-1/2 in the morning and 2 in the evening.  Will write the prescription as to twice a day.    Please call the patient and tell her to continue 1-1/2 in the morning and 2 at night in order to avoid lithium toxicity.

## 2021-06-02 ENCOUNTER — Other Ambulatory Visit: Payer: Self-pay

## 2021-06-02 DIAGNOSIS — G2589 Other specified extrapyramidal and movement disorders: Secondary | ICD-10-CM

## 2021-06-02 DIAGNOSIS — T50905A Adverse effect of unspecified drugs, medicaments and biological substances, initial encounter: Secondary | ICD-10-CM

## 2021-06-02 MED ORDER — AMANTADINE HCL 100 MG PO CAPS
100.0000 mg | ORAL_CAPSULE | Freq: Two times a day (BID) | ORAL | 0 refills | Status: DC
Start: 2021-06-02 — End: 2021-07-03

## 2021-06-03 ENCOUNTER — Other Ambulatory Visit: Payer: Self-pay | Admitting: Psychiatry

## 2021-06-03 DIAGNOSIS — F4001 Agoraphobia with panic disorder: Secondary | ICD-10-CM

## 2021-06-03 DIAGNOSIS — G2589 Other specified extrapyramidal and movement disorders: Secondary | ICD-10-CM

## 2021-06-03 DIAGNOSIS — F250 Schizoaffective disorder, bipolar type: Secondary | ICD-10-CM

## 2021-06-03 DIAGNOSIS — F5105 Insomnia due to other mental disorder: Secondary | ICD-10-CM

## 2021-06-03 DIAGNOSIS — T50905A Adverse effect of unspecified drugs, medicaments and biological substances, initial encounter: Secondary | ICD-10-CM

## 2021-06-03 DIAGNOSIS — F411 Generalized anxiety disorder: Secondary | ICD-10-CM

## 2021-06-04 ENCOUNTER — Telehealth: Payer: Self-pay | Admitting: Psychiatry

## 2021-06-04 ENCOUNTER — Other Ambulatory Visit: Payer: Self-pay | Admitting: Psychiatry

## 2021-06-04 DIAGNOSIS — G2589 Other specified extrapyramidal and movement disorders: Secondary | ICD-10-CM

## 2021-06-04 NOTE — Telephone Encounter (Signed)
Please review.I know you are unable to rx anything for pain but not sure if anything else can be done

## 2021-06-04 NOTE — Telephone Encounter (Signed)
I cannot prescribe pain medicines.  She needs to see her primary care doctor.  If she is seeing a specialist for this in the past she could consider seeing that Dr.  Furthermore if she suspects that Seroquel is causing the pain she should try to stop the Seroquel and see if the problem goes away.  If her insomnia becomes a problem off Seroquel she can try again at the lowest possible dose.

## 2021-06-04 NOTE — Telephone Encounter (Signed)
Pt LVM stating she is experiencing pain in her pelvic floor muscle.  She said she has had the pain before but she thinks it's from the [facility].  She is asking if Dr. Jennelle Human can prescribe her something for the pain.   Next appt 11/12

## 2021-06-04 NOTE — Telephone Encounter (Signed)
Pt informed

## 2021-06-10 ENCOUNTER — Ambulatory Visit: Payer: Medicare Other | Admitting: Psychiatry

## 2021-06-17 ENCOUNTER — Telehealth: Payer: Self-pay | Admitting: Psychiatry

## 2021-06-17 NOTE — Telephone Encounter (Signed)
OK to schedule her Friday 1100

## 2021-06-17 NOTE — Telephone Encounter (Signed)
Next visit is 08/14/21. Laura Mathews called and said that she is having a lot more problems with jerking movements, really tired, very depressed and losing her job. She has no suicidal thoughts. She has wanted to decrease her medication but hasn't done so yet. She is taking her medications as prescribed. She can't wait until her next visit and wants to be seen earlier with Dr. Jennelle Human. She would like a call back at [phone].

## 2021-06-17 NOTE — Telephone Encounter (Signed)
Pt stated she stopped amantadine a week ago due to how tired it made her.She also wants to decrease loxapine.She stated she is afraid she will lose her job if she does not see you.

## 2021-06-18 NOTE — Telephone Encounter (Signed)
I called hear and scheduled her for Fri at 11

## 2021-06-20 ENCOUNTER — Other Ambulatory Visit: Payer: Self-pay

## 2021-06-20 ENCOUNTER — Ambulatory Visit (INDEPENDENT_AMBULATORY_CARE_PROVIDER_SITE_OTHER): Payer: Medicare Other | Admitting: Psychiatry

## 2021-06-20 ENCOUNTER — Encounter: Payer: Self-pay | Admitting: Psychiatry

## 2021-06-20 DIAGNOSIS — F411 Generalized anxiety disorder: Secondary | ICD-10-CM

## 2021-06-20 DIAGNOSIS — F250 Schizoaffective disorder, bipolar type: Secondary | ICD-10-CM

## 2021-06-20 DIAGNOSIS — G251 Drug-induced tremor: Secondary | ICD-10-CM

## 2021-06-20 DIAGNOSIS — F4001 Agoraphobia with panic disorder: Secondary | ICD-10-CM | POA: Diagnosis not present

## 2021-06-20 DIAGNOSIS — Z79899 Other long term (current) drug therapy: Secondary | ICD-10-CM

## 2021-06-20 DIAGNOSIS — G2589 Other specified extrapyramidal and movement disorders: Secondary | ICD-10-CM

## 2021-06-20 DIAGNOSIS — T50905A Adverse effect of unspecified drugs, medicaments and biological substances, initial encounter: Secondary | ICD-10-CM

## 2021-06-20 DIAGNOSIS — R7989 Other specified abnormal findings of blood chemistry: Secondary | ICD-10-CM

## 2021-06-20 DIAGNOSIS — R55 Syncope and collapse: Secondary | ICD-10-CM

## 2021-06-20 NOTE — Progress Notes (Signed)
Laura Mathews [id number] 1980/08/23 40 y.o.     Subjective:   Patient ID:  Laura Mathews is a 40 y.o. (DOB [birth year]) female.  Chief Complaint:  Chief Complaint  Patient presents with   Follow-up    Schizoaffective disorder, bipolar type ([place])      Laura Mathews presents  today for follow-up of schizoaffective depression with hallucinations and anxiety.  When seen August 2020.  We reduced the Cytomel to 37.5 mcg bc of suppressed TSH.  She's noticed no problems.  At visit September 19, 2018.  At that visit we reduced Wellbutrin XL 300 to 150 because of anxiety and recently high blood pressure.    At her visit in April 2020.  She reported no problems from reducing the Wellbutrin and that her anxiety was somewhat better.  There were no changes made at the visit in April.  At visit October 2020.  No meds were changed.  [facility] not [facility] which is a significant risk for this patient. She will try to access it.  Overall Loxapine has been very good to me but some movement disorder issues with toes moving, teeth grinding, Arm stiffness R, swallow involuntary movements.  seen May 25, 2019.  She remained on loxapine 300 mg.  Under stress she was still having hallucinations and she was given olanzapine 10 mg sublingual as needed auditory hallucinations.  She was continued on Wellbutrin XL 150 mg.  She was continued on Cytomel 37.5 mg every morning.  Also continued lithium 1200 mg daily and lamotrigine 200 mg daily. There was concern about the loxapine shortage and that she might have to be switched to another medication hopefully with low EPS risk such as Fanapt.  But she has a history of orthostatic hypotension and it is unclear if she could tolerate that medication.  seen July 28, 2019.  Because of the shortage of loxapine we had to wean and discontinue that medication.  She was given Caplyta as an alternative antipsychotic  given multiple other failures and her tendency to be very sensitive to EPS and tardive dyskinesia. Patient took it for about 3 weeks and then called February 1 stating that she was having nausea and involuntary facial movements and had found loxapine and wanted to return to it.  Therefore Caplyta was stopped and she was restarted on loxapine to quickly titrate back up to 300 mg nightly the same dose that she is taken in the past.  seen FEB 8 seen with her mother.  Ran out of [place] for 3 days bc pharmacy didn't have it.  Just back to 300 mg loxapine as of last night.  Slept fine on Caplyta with less duration needed. Mood pretty stable and not markedly depressed.  Today feels a ltttle psychotic with voices and feeling people are inside her a little confusion.  Movements from mouth have seemed better.  Has felt a little tight throat that scared her.  Mo notes facial grimacing and lip pursing has almost totally gone.   Mood really well overall with ECT reduced to once monthly. Reports getting "modified bilateral"  ECT.   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 Twin sister had baby boy .  Laura Mathews is doing well.  Applying for better job.  Some are in other states.  Not worrying about it.  Together for 13 years.    As  of October 02, 2019, Really well overall.   Not manic and "not too psychotic".  Voices down to mostly noise.   Patient reports stable mood and denies depressed or irritable moods.  Anxiety is better and using clonazepam or Seroquel.    Walking more has helped and less depression improved motivation. Patient denies difficulty with sleep initiation or maintenance. Denies appetite disturbance.  Patient reports that energy and motivation have been good.  Patient denies any difficulty with concentration.  Patient denies any suicidal ideation.  No recent panic.   Quiet noise. No med changes.  11/29/19 appt the  following noted: Needs trazodone and Seroquel to sleep.  Rare olanzapine for AH.  Still trouble falling asleep with meds and can lay in bed for hours.  Will sleep late to make up for it.   Otherwise well without mania nor depression.  Occ psychotic sx with stressors.  Nephro will leave her on lithium and start amiloride for Nephrogenic Diabetes Insipidus.  Has started and will fu with labs in 2 weeks.Sx polydipsia and polyuria and thirst.  Couple of accidents and 1 in public.  Said renal function was good otherwise.  No SI in a long time.   Tutoring 3 hours weekly.  Enjoys it.   Laura Mathews is not planning a job change at this time.  Will sign lease for apt for another year. Plan: no med changes  05/07/2020 appointment with the following noted: Continued all meds.  Still doing maintenance ECT monthly. No olanzapine lately.  Only twice since had it.   For the most part don't hear voices unless under severe stress. Mood happy without depression. Doing deep breathing from DBT to help with anxiety.  I'm still an anxious person. 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   CO weight gain since here and thinks it's from using the Seroquel 50-100 mg  for sleep and now trying to use less. Sleep well with Seroquel and Klonopin.  SE EPS bc walking with arms bent and this is leading to some pain R >L.  Can straighten it out.  08/06/2020 appt with following noted: No olanzapine used in months. Well overall.  Mood pretty balanced now. No mania or depression now. Last had depression after severe UTI. Not sure when but anxiety worse in the am and hard to get off the couch until early afternoon.  No specific thoughts or fears associated.   Still going to [employer] to meet friends. Not much caffeine 1 cup am.   Sleep really well.   Started Noom and lost 7 # so far.   Voices only problem when stressed.   Plan: Hold  Wellbutrin 150 for 1 week to see if anxiety is better .    10/02/2020 phone call from  patient complaining of worsening depression with suicidal thoughts without intent. Nurse note:Rtc to pt and she reports having increased depression for about 1 week now, she had ECT on Friday 3/11 and will have ECT again this Friday the 18th. She has been waking up earlier then usual and unable to fall asleep, about an 1-1/2 hours early.  Asking if there is anything she can do to help? Suicidal thoughts but nothing specific or any intent MD response:Lithium level checked on 09/27/2020 was 1.0.  Therefore no room to increase that dosage. On sertraline 200 mg daily so no room to increase that dosage. At the last appointment, I asked her to hold the Wellbutrin for a week to see if her  anxiety was better.  I do not know if she stopped it or restarted it.  If she stopped it have her restart the Wellbutrin XL 150 mg 1 AM.  If she is still taking it have her increase the Wellbutrin XL to 1-1/2 tablets every morning.  The bottle will say not to cut the tablets but because her dosage is low it is okay to cut the tablets if needed. Nurse contact with patient:Rtc to pt and she reports she did stop it but restarted it after 2 weeks, advised her to increase Wellbutrin XL to 1.5 tablets of 150 mg tablet. She will call back with worsening symptoms  10/22/2020 appointment with the following noted: Doing a lot better with depression.  Don't feel depressed anymore.  Had extra ECT 2 per week for 3 weeks. Ketamine used and triggered hallucinations for a couple of weeks which were visual shapes and not scary.   Feels like heart is beating too fast and hard to relax and harder to sleep. While off Wellbutrin anxiety feelings got better. Asks if other treatment options exist for depression besides ECT. Mom on session says her memory is Terrible DT ECT. Getting modified bilateral ECT. Plan: In hopes of getting better control of depression with the eventual goal of stopping ECT we will make the following change, DC Wellbutrin   And start Caplyta 42 mg daily. .  11/27/2020 appointment with the following noted:  Seen with mother Patient hospitalized 11/20/2020 [facility] care for suicidal ideation and depression. ECT 4/29.  Then dep with SI 5/3 and went to ER.  Had ECT and felt better on 11/22/20.   SI seemed to come on gradually.  A lot of stress lately.  Some voices lately.   They thought the ketamine was causing hallucinations and it was changed without full seizue and then switched to something else and then back to ketamine.   Depressed but not suicidal and no where near suicidal.   Doesn't remember any effect of switch to Caplyta.   Plan: Reduce Loxapine  With goal of going on Caplyta alone.  Caplyta might work better for depression without the loxapine. Reduce loxapine to 5 capsules daily for 2 weeks,  Then reduce to 4 capsules daily for 2 weeks,  Then reduce to 3 capsules daily. Disc risk increased AH.  02/17/2021 appointment with the following noted: BF David on the session Got psychotic off loxapine and had to raise dose to 300 mg HS.  No hospitalization. Stopped Caplyta 3 days ago.  Has had excessive sleepiness.   Get lightheaded and dizzy and feels exhausted.  BF says she's collapsed at times. Some of the time she feels anxious but not all the time. Better mood in [place] last couple of weeks.  Has had a lot of depression in the last few weeks before the trip. Still some tiredness.   CO stiff arms walking and hurts to walk. Likes loxapine with control voices generally. SE overall been OK until lately some neck and back pain ? Related.    06/20/21 appt noted:  seen with H and M Really depressed, anxious and psychotic.  Voices worse when depressed and anxious. ECT every 2-3 weeks since march.   Voices of people and God tell her to hurt herself and I don't want to.  Trying to act like a normal person.  2 weeks ago stopped  amantadine bc SE and not hlelping that much. Missing work a lot lately with anxiety and  depression and  pain in arms and legs she thinks related to stopping amantadine.  Passing out also more the past month per Laura Mathews but Jerilynn Mages thinks it has been longer. M concerns frequency of ECT and memory, pain episodes that come and go maybe related to meds, and needs med change. BF notices generally worse over the last month but sees it as cyclical.      Helped memory.  Asks about stretching it further.  However when she went 5-week intervals this last month she noticed more depression during the week preceding the ECT  Psychiatric medication history includes risperidone 3.5 mg EPS,  Abilify 30 mg withdrawal dyskinesia, perphenazine, Geodon, olanzapine, Saphris 5 mg, Haldol, Latuda 160, loxapine 300 mg, Seroquel constipation and U px,   Caplyta NR lamotrigine, Depakote, lithium, clonazepam,  Fetzima, sertraline, Pristiq, paroxetine,  citalopram, Lexapro,  Cytomel, topiramate, buspirone, gabapentin, trazodone  propranolol with no response for anxiety benztropine ECT maintenance Under our psychiatric care since 2006.  Remote history of OD Klonopin.  Review of Systems:  Review of Systems  Constitutional:  Positive for fatigue and unexpected weight change.  Endocrine: Positive for polyuria.  Genitourinary:  Negative for urgency.  Musculoskeletal:  Positive for back pain, myalgias and neck pain.  Neurological:  Positive for tremors. Negative for dizziness, weakness and headaches.  Psychiatric/Behavioral:  Positive for decreased concentration. Negative for agitation, behavioral problems, confusion, dysphoric mood, hallucinations, self-injury, sleep disturbance and suicidal ideas. The patient is not nervous/anxious and is not hyperactive.    Medications: I have reviewed the patient's current medications.  Current Outpatient Medications  Medication Sig Dispense Refill   aMILoride (MIDAMOR) 5 MG tablet Take 5 mg by mouth daily.     b complex vitamins tablet Take 1 tablet by mouth daily.      Cholecalciferol (VITAMIN D3) 5000 units CAPS Take 1 capsule by mouth daily.     clonazePAM (KLONOPIN) 0.5 MG tablet TAKE 1 TABLET BY MOUTH TWICE DAILY, AND TAKE 2 TABLETS AT BEDTIME AS NEEDED 60 tablet 0   lamoTRIgine (LAMICTAL) 200 MG tablet TAKE 1 TABLET DAILY 90 tablet 1   liothyronine (CYTOMEL) 25 MCG tablet TAKE 1 AND 1/2 TABLETS BY MOUTH EVERY MORNING 135 tablet 0   lithium carbonate (LITHOBID) 300 MG CR tablet Take 2 tablets (600 mg total) by mouth 2 (two) times daily. TAKE 1 AND 1/2 TABLETS IN  THE MORNING AND 2 TABLETS  IN THE EVENING 360 tablet 0   loxapine (LOXITANE) 50 MG capsule TAKE 6 CAPSULES AT BEDTIME 540 capsule 1   Melatonin 10 MG CAPS Take 10 mg by mouth.     Omega-3 Fatty Acids (FISH OIL) 1200 MG CAPS Take by mouth.     QUEtiapine (SEROQUEL) 50 MG tablet TAKE 1 TO 2 TABLETS AT     NIGHT FOR SLEEP 180 tablet 0   sertraline (ZOLOFT) 100 MG tablet TAKE 2 TABLETS DAILY 180 tablet 1   traZODone (DESYREL) 100 MG tablet TAKE 1 TABLET BY MOUTH EVERYDAY AT BEDTIME 90 tablet 1   Acetylcysteine (NAC) 600 MG CAPS Take 600 mg by mouth 2 (two) times daily. (Patient not taking: Reported on 06/20/2021)     amantadine (SYMMETREL) 100 MG capsule Take 1 capsule (100 mg total) by mouth 2 (two) times daily. (Patient not taking: Reported on 06/20/2021) 60 capsule 0   fluticasone (FLONASE) 50 MCG/ACT nasal spray 1 spray by Each Nare route daily.     Multiple Vitamins-Minerals (MULTIVITAMIN ADULT) CHEW Chew by mouth. (Patient not taking: Reported on  10/22/2020)     pyridOXINE (VITAMIN B-6) 100 MG tablet Take 100 mg by mouth daily. (Patient not taking: Reported on 10/22/2020)     vitamin E 1000 UNIT capsule Take 1,000 Units by mouth daily. (Patient not taking: Reported on 10/22/2020)     No current facility-administered medications for this visit.    Medication Side Effects: Other: tremor , grinding teeth daytime, blurred vision, mild right arm posturing, sleep 11 hours.  Not much daytime sleepiness.  Stimulating SE Wellbutrin  Allergies:  Allergies  Allergen Reactions   Metformin And Related    Naltrexone     History reviewed. No pertinent past medical history.  History reviewed. No pertinent family history.  Social History   Socioeconomic History   Marital status: Legally Separated    Spouse name: Not on file   Number of children: Not on file   Years of education: Not on file   Highest education level: Not on file  Occupational History   Not on file  Tobacco Use   Smoking status: Never   Smokeless tobacco: Never  Substance and Sexual Activity   Alcohol use: Not on file   Drug use: Not on file   Sexual activity: Not on file  Other Topics Concern   Not on file  Social History Narrative   Not on file   Social Determinants of Health   Financial Resource Strain: Not on file  Food Insecurity: Not on file  Transportation Needs: Not on file  Physical Activity: Not on file  Stress: Not on file  Social Connections: Not on file  Intimate Partner Violence: Not on file    Past Medical History, Surgical history, Social history, and Family history were reviewed and updated as appropriate.   Please see review of systems for further details on the patient's review from today.   Objective:   Physical Exam:  There were no vitals taken for this visit.  Physical Exam Constitutional:      General: She is not in acute distress.    Appearance: She is well-developed.  Neurological:     Mental Status: She is alert and oriented to person, place, and time.     Cranial Nerves: No dysarthria.  Psychiatric:        Attention and Perception: Attention normal. She perceives auditory hallucinations. She does not perceive visual hallucinations.        Mood and Affect: Mood is anxious and depressed. Affect is not labile, blunt, angry, tearful or inappropriate.        Speech: Speech normal. Speech is not slurred.        Behavior: Behavior normal. Behavior is cooperative.         Thought Content: Thought content normal. Thought content is not paranoid or delusional. Thought content does not include homicidal or suicidal ideation. Thought content does not include homicidal or suicidal plan.        Cognition and Memory: Cognition and memory normal.        Judgment: Judgment normal.     Comments: More depessed    Lab Review:     Component Value Date/Time   NA 138 09/28/2019 0954   K 4.2 09/28/2019 0954   CL 103 09/28/2019 0954   CO2 23 09/28/2019 0954   GLUCOSE 93 09/28/2019 0954   GLUCOSE 99 10/10/2010 1150   BUN 16 09/28/2019 0954   CREATININE 0.81 09/28/2019 0954   CALCIUM 10.3 (H) 09/28/2019 0954   PROT 7.5 10/10/2010 0207   ALBUMIN 4.1  10/10/2010 0207   AST 23 10/10/2010 0207   ALT 19 10/10/2010 0207   ALKPHOS 40 10/10/2010 0207   BILITOT 0.4 10/10/2010 0207   GFRNONAA 94 09/28/2019 0954   GFRAA 108 09/28/2019 0954       Component Value Date/Time   WBC 16.7 (H) 10/10/2010 0207   RBC 4.50 10/10/2010 0207   HGB 12.7 04/14/2011 0956   HCT 40.0 10/10/2010 0223   PLT 225 10/10/2010 0207   MCV 86.7 10/10/2010 0207   MCH 28.7 10/10/2010 0207   MCHC 33.1 10/10/2010 0207   RDW 12.9 10/10/2010 0207   LYMPHSABS 3.3 10/10/2010 0207   MONOABS 1.7 (H) 10/10/2010 0207   EOSABS 0.4 10/10/2010 0207   BASOSABS 0.1 10/10/2010 0207    Lithium Lvl  Date Value Ref Range Status  09/28/2019 1.0 0.6 - 1.2 mmol/L Final    Comment:                                     Detection Limit = 0.1                           <0.1 indicates None Detected     TSH low on Cytomel in June 2020 No results found for: PHENYTOIN, PHENOBARB, VALPROATE, CBMZ  l  Component 04/17/20 03/06/20 02/13/20 01/09/20 12/25/19 12/07/19  Lithium Lvl 1.2 1.2 1.4 High  1.1 1.0 1.0   Component 04/17/20 03/06/20 02/13/20 01/09/20 12/25/19 12/07/19  Sodium 130 Low  135 137 136 135 137  Potassium 4.3 4.1 4.0 4.2 4.1 4.1  Chloride 103 105 105 104 105 105  Anion Gap 4 Low  '5 6 5 7 5  ' CO2 23.0  24.9 26.1 27.2 23.5 26.7  BUN 7 Low  '15 14 18 16 16  ' Creatinine 0.88 0.84 High  0.80 0.82 High  0.72 0.77  BUN/Creatinine Ratio '8 18 18 22 22 21  ' EGFR CKD-EPI Non-African American, Female 84 89 >90 >90 >90 >90  EGFR CKD-EPI African American, Female >90 >90 >90 >90 >90 >90  Glucose 91 99 89 112 95 98  Calcium 10.3 10.9 High  10.8 High  11.1 High  10.9 High  10.7 High   Albumin 4.5 4.6 4.6 4.1 4.3 4.4  Total Protein 7.6 -- 7.9 -- -- --  Total Bilirubin 0.5 -- 0.5 -- -- --  AST 19 -- 19 -- -- --  ALT 19 -- 18 -- -- --  Alkaline Phosphatase 67 -- 71 -- -- --   Component 04/17/20 02/13/20 01/09/20 12/25/19 12/07/19 11/01/19  Osmolality, Ur 69 220 117 109 199 198     07/17/20 05/20/20 04/17/20 03/06/20 02/13/20 02/13/20  Sodium 135 137 130 Low  135 137 --  Potassium 4.2 4.9 4.3 4.1 4.0 --  Chloride 104 105 103 105 105 --  CO2 25.9 27.5 23.0 24.9 26.1 --  Anion Gap '5 5 4 ' Low  5 6 --  BUN '15 14 7 ' Low  15 14 --  Creatinine 0.71 0.93 0.88 0.84 High  0.80 --  BUN/Creatinine Ratio '21 15 8 18 18 ' --  EGFR CKD-EPI Non-African American, Female >90 79 84 89 >90 --  EGFR CKD-EPI African American, Female >90 91 >90 >90 >90 --  Glucose 92 89 91 99 89 --  Calcium 10.7 9.9 10.3 10.9 High  10.8 High  --  Phosphorus 2.7 -- -- 3.4 --  3.5  Albumin 4.4 -- 4.5 4.6 4.6 --   Lithium level 07/17/2020 was 1.1 on the current dose of lithium. .res Assessment: Plan:    There are no diagnoses linked to this encounter.   Greater than 50% of 55 minutes face to face time with patient was spent on counseling and coordination of care.  Ramandeep is chronically unstable and prone to extrapyramidal side effects complicating treatment. Very complicated patient on polypharmacy and receiving ECT.  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  Patient's mother would really like her off ECT because of concern it may be affecting memory.  Also there is concern because her depression is getting worse and none of them want to increase the frequency of ECT to deal with it.  There has been some increased ECT lately but not sufficient response.  Discussed the possibility of clozapine again because whenever she is more depressed she hears voices more.  The voices can tell her to hurt herself but she commits to not doing so.  EPS problems: amantadine 100 mg BID  She is not significantly depressed at this time but has been lately.  She continues maintenance ECT  Start Caplyta 42 mg daily vs Vraylar.  The benefit may be obscured by the loxapine but cannot stop it all at once.  The hope is this will help bipolar depression enough that she could sing consider stopping ECT.  She and her mother are concerned about ECTs effect on her memory which affects her overall quality of life.  We cannot discontinue the loxapine at this time without worsening hallucinations.  However if the Caplyta is helpful for depression then we will attempt to gradually wean loxapine.  We have discussed that is unusual to use 2 antipsychotics together but in this case it is medically necessary.  Discussed side effects.  For anxiety consider low dose Luvox which has less risk of mood cycling than other SSRIs usually and helps anxiety.  Extensive discussion of clozapine in detail.   Including the risk of severe neutropenia, marked weight gain, sedation, metabolic problems, cardiac risk, etc.  Discussed the need for weekly blood test for at least 6 months.  However we also discussed this is the most effective antipsychotic on the market.  It matter may better control her voices.  She wishes to defer.  Failed attempt to wean loxapine. Continue 300 mg daily. Add salt tablets to each meal to stop orthostasis  Most effective option  clozapine.    Reduce sertraline to 1.5 tablets daily Start Vraylar 1.5 mg daily for 2 weeks, then increase to 3 mg daily Reduce loxapine to 5 capsules daily for 2 weeks then reduce to 4 capsules daily.  Her low vitamin D level is supplemented and resolved with supplemental vitamin D.  Low Vitamin D increases the risk of depression  Option Ozempic or Saxenda for weight loss.  Disc in detail in previous visit. Disc  Counseled patient regarding potential benefits, risks, and side effects of lithium to include potential risk of lithium affecting thyroid and renal function.  Discussed need for periodic lab monitoring to determine drug level and to assess for potential adverse effects.  Counseled patient regarding signs and symptoms of lithium toxicity and advised that they notify office immediately or seek urgent medical attention if experiencing these signs and symptoms.  Patient advised to contact office with any questions or concerns. Being followed by nephro and labs inlcuding lithium levels noted above.   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 No indication to change bc lithium helps her stability so much.  Will continue to coordinate with nephrology;  Follow-up 2 weeks  Lynder Parents, MD, DFAPA  Please see After Visit Summary for patient specific instructions.  Future Appointments  Date Time Provider [place]  08/14/2021  2:00 PM Cottle, Billey Co., MD [facility] None     No orders of the defined types were placed in this encounter.     -------------------------------

## 2021-06-20 NOTE — Patient Instructions (Signed)
Reduce sertraline to 1.5 tablets daily Start Vraylar 1.5 mg daily for 2 weeks, then increase to 3 mg daily Reduce loxapine to 5 capsules daily for 2 weeks then reduce to 4 capsules daily.

## 2021-06-26 ENCOUNTER — Telehealth: Payer: Self-pay | Admitting: Psychiatry

## 2021-06-26 ENCOUNTER — Other Ambulatory Visit: Payer: Self-pay

## 2021-06-26 DIAGNOSIS — F250 Schizoaffective disorder, bipolar type: Secondary | ICD-10-CM

## 2021-06-26 DIAGNOSIS — F5105 Insomnia due to other mental disorder: Secondary | ICD-10-CM

## 2021-06-26 MED ORDER — QUETIAPINE FUMARATE 50 MG PO TABS: ORAL_TABLET | ORAL | 0 refills | Status: DC

## 2021-06-26 NOTE — Telephone Encounter (Signed)
Should I send #30 locally or about 10 days worth until mail order comes?

## 2021-06-26 NOTE — Telephone Encounter (Signed)
Rx sent 

## 2021-06-26 NOTE — Telephone Encounter (Signed)
Please send in [facility] day [facility] to [facility].  But will need #[phone] sent locally also b/c she will run out: [facility] [address], [place], [place]

## 2021-06-26 NOTE — Telephone Encounter (Signed)
10-day supplies should be adequate.  Thank you

## 2021-06-29 ENCOUNTER — Other Ambulatory Visit: Payer: Self-pay | Admitting: Psychiatry

## 2021-06-29 DIAGNOSIS — F5105 Insomnia due to other mental disorder: Secondary | ICD-10-CM

## 2021-06-30 NOTE — Telephone Encounter (Signed)
90 day ok?

## 2021-07-03 ENCOUNTER — Encounter: Payer: Self-pay | Admitting: Psychiatry

## 2021-07-03 ENCOUNTER — Telehealth (INDEPENDENT_AMBULATORY_CARE_PROVIDER_SITE_OTHER): Payer: Medicare Other | Admitting: Psychiatry

## 2021-07-03 DIAGNOSIS — F4001 Agoraphobia with panic disorder: Secondary | ICD-10-CM

## 2021-07-03 DIAGNOSIS — G2589 Other specified extrapyramidal and movement disorders: Secondary | ICD-10-CM | POA: Diagnosis not present

## 2021-07-03 DIAGNOSIS — F250 Schizoaffective disorder, bipolar type: Secondary | ICD-10-CM

## 2021-07-03 DIAGNOSIS — F411 Generalized anxiety disorder: Secondary | ICD-10-CM | POA: Diagnosis not present

## 2021-07-03 DIAGNOSIS — R7989 Other specified abnormal findings of blood chemistry: Secondary | ICD-10-CM

## 2021-07-03 DIAGNOSIS — R55 Syncope and collapse: Secondary | ICD-10-CM

## 2021-07-03 DIAGNOSIS — G251 Drug-induced tremor: Secondary | ICD-10-CM

## 2021-07-03 DIAGNOSIS — F5105 Insomnia due to other mental disorder: Secondary | ICD-10-CM

## 2021-07-03 DIAGNOSIS — T50905A Adverse effect of unspecified drugs, medicaments and biological substances, initial encounter: Secondary | ICD-10-CM

## 2021-07-03 DIAGNOSIS — Z79899 Other long term (current) drug therapy: Secondary | ICD-10-CM

## 2021-07-03 MED ORDER — LITHIUM CARBONATE ER 300 MG PO TBCR: 600.0000 mg | EXTENDED_RELEASE_TABLET | Freq: Two times a day (BID) | ORAL | 0 refills | Status: DC

## 2021-07-03 NOTE — Patient Instructions (Signed)
Increase Vraylar to 3 mg daily Reduce loxapine to 4 capsules daily

## 2021-07-03 NOTE — Progress Notes (Signed)
Laura Mathews [id number] Apr 11, 1981 40 y.o.   Video Visit via My Chart  I connected with pt by My Chart and verified that I am speaking with the correct person using two identifiers.   I discussed the limitations, risks, security and privacy concerns of performing an evaluation and management service by My Chart  and the availability of in person appointments. I also discussed with the patient that there may be a patient responsible charge related to this service. The patient expressed understanding and agreed to proceed.  I discussed the assessment and treatment plan with the patient. The patient was provided an opportunity to ask questions and all were answered. The patient agreed with the plan and demonstrated an understanding of the instructions.   The patient was advised to call back or seek an in-person evaluation if the symptoms worsen or if the condition fails to improve as anticipated.  I provided 30 minutes of video time during this encounter.  The patient was located at home and the provider was located office. Session from 5 until 530  Subjective:   Patient ID:  Laura Mathews is a 40 y.o. (DOB 1981-07-02) female.  Chief Complaint:  Chief Complaint  Patient presents with   Follow-up    Schizoaffective disorder, bipolar type Laura Mathews)   Depression   Anxiety      Laura Mathews presents  today for follow-up of schizoaffective depression with hallucinations and anxiety.  When seen August 2020.  We reduced the Cytomel to 37.5 mcg bc of suppressed TSH.  She's noticed no problems.  At visit September 19, 2018.  At that visit we reduced Wellbutrin XL 300 to 150 because of anxiety and recently high blood pressure.    At her visit in April 2020.  She reported no problems from reducing the Wellbutrin and that her anxiety was somewhat better.  There were no changes made at the visit in April.  At visit October 2020.  No meds were changed.  Have gotten notice  about [facility] not having access to loxapine which is a significant risk for this patient. She will try to access it.  Overall Loxapine has been very good to me but some movement disorder issues with toes moving, teeth grinding, Arm stiffness R, swallow involuntary movements.  seen May 25, 2019.  She remained on loxapine 300 mg.  Under stress she was still having hallucinations and she was given olanzapine 10 mg sublingual as needed auditory hallucinations.  She was continued on Wellbutrin XL 150 mg.  She was continued on Cytomel 37.5 mg every morning.  Also continued lithium 1200 mg daily and lamotrigine 200 mg daily. There was concern about the loxapine shortage and that she might have to be switched to another medication hopefully with low EPS risk such as Fanapt.  But she has a history of orthostatic hypotension and it is unclear if she could tolerate that medication.  seen July 28, 2019.  Because of the shortage of loxapine we had to wean and discontinue that medication.  She was given Caplyta as an alternative antipsychotic given multiple other failures and her tendency to be very sensitive to EPS and tardive dyskinesia. Patient took it for about 3 weeks and then called February 1 stating that she was having nausea and involuntary facial movements and had found loxapine and wanted to return to it.  Therefore Caplyta was stopped and she was restarted on loxapine to quickly titrate back up to 300 mg nightly the same dose that  she is taken in the past.  seen FEB 8 seen with her mother.  Ran out of Caplyta for 3 days bc pharmacy didn't have it.  Just back to 300 mg loxapine as of last night.  Slept fine on Caplyta with less duration needed. Mood pretty stable and not markedly depressed.  Today feels a ltttle psychotic with voices and feeling people are inside her a little confusion.  Movements from mouth have seemed better.  Has felt a little tight throat that scared her.  Mo notes facial grimacing and  lip pursing has almost totally gone.   Mood really well overall with ECT reduced to once monthly. Reports getting "modified bilateral"  ECT.   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 Twin sister had baby boy .  Laura Mathews is doing well.  Applying for better job.  Some are in other states.  Not worrying about it.  Together for 13 years.    As of October 02, 2019, Really well overall.   Not manic and "not too psychotic".  Voices down to mostly noise.   Patient reports stable mood and denies depressed or irritable moods.  Anxiety is better and using clonazepam or Seroquel.    Walking more has helped and less depression improved motivation. Patient denies difficulty with sleep initiation or maintenance. Denies appetite disturbance.  Patient reports that energy and motivation have been good.  Patient denies any difficulty with concentration.  Patient denies any suicidal ideation.  No recent panic.   Quiet noise. No med changes.  11/29/19 appt the following noted: Needs trazodone and Seroquel to sleep.  Rare olanzapine for AH.  Still trouble falling asleep with meds and can lay in bed for hours.  Will sleep late to make up for it.   Otherwise well without mania nor depression.  Occ psychotic sx with stressors.  Nephro will leave her on lithium and start amiloride for Nephrogenic Diabetes Insipidus.  Has started and will fu with labs in 2 weeks.Sx polydipsia and polyuria and thirst.  Couple of accidents and 1 in public.  Said renal function was good otherwise.  No SI in a long time.   Tutoring 3 hours weekly.  Enjoys it.   Laura Mathews is not planning a job change at this time.  Will sign lease for apt for another year. Plan: no med changes  05/07/2020 appointment with the following noted: Continued all meds.  Still doing maintenance ECT monthly. No olanzapine lately.  Only twice since had it.   For the most part don't hear voices  unless under severe stress. Mood happy without depression. Doing deep breathing from DBT to help with anxiety.  I'm still an anxious person. 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   CO weight gain since here and thinks it's from using the Seroquel 50-100 mg  for sleep and now trying to use less. Sleep well with Seroquel and Klonopin.  SE EPS bc walking with arms bent and this is leading to some pain R >L.  Can straighten it out.  08/06/2020 appt with following noted: No olanzapine used in months. Well overall.  Mood pretty balanced now. No mania or depression now. Last had depression after severe UTI. Not sure when but anxiety worse in the am and hard to get off the couch until early afternoon.  No specific thoughts or fears associated.   Still going to  [employer] to meet friends. Not much caffeine 1 cup am.   Sleep really well.   Started Noom and lost 7 # so far.   Voices only problem when stressed.   Plan: Hold  Wellbutrin 150 for 1 week to see if anxiety is better .    10/02/2020 phone call from patient complaining of worsening depression with suicidal thoughts without intent. Nurse note:Rtc to pt and she reports having increased depression for about 1 week now, she had ECT on Friday 3/11 and will have ECT again this Friday the 18th. She has been waking up earlier then usual and unable to fall asleep, about an 1-1/2 hours early.  Asking if there is anything she can do to help? Suicidal thoughts but nothing specific or any intent MD response:Lithium level checked on 09/27/2020 was 1.0.  Therefore no room to increase that dosage. On sertraline 200 mg daily so no room to increase that dosage. At the last appointment, I asked her to hold the Wellbutrin for a week to see if her anxiety was better.  I do not know if she stopped it or restarted it.  If she stopped it have her restart the Wellbutrin XL 150 mg 1 AM.  If she is still taking it have her increase the Wellbutrin XL to  1-1/2 tablets every morning.  The bottle will say not to cut the tablets but because her dosage is low it is okay to cut the tablets if needed. Nurse contact with patient:Rtc to pt and she reports she did stop it but restarted it after 2 weeks, advised her to increase Wellbutrin XL to 1.5 tablets of 150 mg tablet. She will call back with worsening symptoms  10/22/2020 appointment with the following noted: Doing a lot better with depression.  Don't feel depressed anymore.  Had extra ECT 2 per week for 3 weeks. Ketamine used and triggered hallucinations for a couple of weeks which were visual shapes and not scary.   Feels like heart is beating too fast and hard to relax and harder to sleep. While off Wellbutrin anxiety feelings got better. Asks if other treatment options exist for depression besides ECT. Mom on session says her memory is Terrible DT ECT. Getting modified bilateral ECT. Plan: In hopes of getting better control of depression with the eventual goal of stopping ECT we will make the following change, DC Wellbutrin  And start Caplyta 42 mg daily. .  11/27/2020 appointment with the following noted:  Seen with mother Patient hospitalized 11/20/2020 [facility] for suicidal ideation and depression. ECT 4/29.  Then dep with SI 5/3 and went to ER.  Had ECT and felt better on 11/22/20.   SI seemed to come on gradually.  A lot of stress lately.  Some voices lately.   They thought the ketamine was causing hallucinations and it was changed without full seizue and then switched to something else and then back to ketamine.   Depressed but not suicidal and no where near suicidal.   Doesn't remember any effect of switch to Caplyta.   Plan: Reduce Loxapine  With goal of going on Caplyta alone.  Caplyta might work better for depression without the loxapine. Reduce loxapine to 5 capsules daily for 2 weeks,  Then reduce to 4 capsules daily for 2 weeks,  Then reduce to 3 capsules daily. Disc risk  increased AH.  02/17/2021 appointment with the following noted: BF David on the session Got psychotic off loxapine and had to raise dose  to 300 mg HS.  No hospitalization. Stopped Caplyta 3 days ago.  Has had excessive sleepiness.   Get lightheaded and dizzy and feels exhausted.  BF says she's collapsed at times. Some of the time she feels anxious but not all the time. Better mood in [place] last couple of weeks.  Has had a lot of depression in the last few weeks before the trip. Still some tiredness.   CO stiff arms walking and hurts to walk. Likes loxapine with control voices generally. SE overall been OK until lately some neck and back pain ? Related.    06/20/21 appt noted:  seen with H and M Really depressed, anxious and psychotic.  Voices worse when depressed and anxious. ECT every 2-3 weeks since march.   Voices of people and God tell her to hurt herself and I don't want to.  Trying to act like a normal person.  2 weeks ago stopped  amantadine bc SE and not hlelping that much. Missing work a lot lately with anxiety and depression and pain in arms and legs she thinks related to stopping amantadine.  Passing out also more the past month per Laura Mathews but Judie Petit thinks it has been longer. M concerns frequency of ECT and memory, pain episodes that come and go maybe related to meds, and needs med change. BF notices generally worse over the last month but sees it as cyclical.  07/03/21 appt noted: Had ECT last 2 weeks. On Vraylar 1.5 mg daily and will increase to 3 mg next week. Some stiffness in neck and arms. Less depressed than she was.  Voices are a lot better so far but not gone completely.    Helped memory.  Asks about stretching it further.  However when she went 5-week intervals this last month she noticed more depression during the week preceding the ECT  Psychiatric medication history includes risperidone 3.5 mg EPS,  Abilify 30 mg withdrawal dyskinesia, perphenazine, Geodon, olanzapine,  Saphris 5 mg, Haldol, Latuda 160, loxapine 300 mg, Seroquel constipation and U px,   Caplyta NR lamotrigine, Depakote, lithium, clonazepam,  Fetzima, sertraline, Pristiq, paroxetine,  citalopram, Lexapro,  Cytomel, topiramate, buspirone, gabapentin, trazodone  propranolol with no response for anxiety benztropine ECT maintenance Under our psychiatric care since 2006.  Remote history of OD Klonopin.  Review of Systems:  Review of Systems  Constitutional:  Positive for fatigue. Negative for unexpected weight change.  Endocrine: Positive for polyuria.  Genitourinary:  Negative for urgency.  Musculoskeletal:  Positive for back pain, myalgias and neck pain.  Neurological:  Positive for tremors. Negative for dizziness, weakness and headaches.  Psychiatric/Behavioral:  Positive for decreased concentration. Negative for agitation, behavioral problems, confusion, dysphoric mood, hallucinations, self-injury, sleep disturbance and suicidal ideas. The patient is not nervous/anxious and is not hyperactive.    Medications: I have reviewed the patient's current medications.  Current Outpatient Medications  Medication Sig Dispense Refill   aMILoride (MIDAMOR) 5 MG tablet Take 5 mg by mouth daily.     b complex vitamins tablet Take 1 tablet by mouth daily.     cariprazine (VRAYLAR) 1.5 MG capsule Take 1.5 mg by mouth daily.     Cholecalciferol (VITAMIN D3) 5000 units CAPS Take 1 capsule by mouth daily.     clonazePAM (KLONOPIN) 0.5 MG tablet TAKE 1 TABLET BY MOUTH TWICE DAILY, AND TAKE 2 TABLETS AT BEDTIME AS NEEDED 60 tablet 0   lamoTRIgine (LAMICTAL) 200 MG tablet TAKE 1 TABLET DAILY 90 tablet 1   liothyronine (  CYTOMEL) 25 MCG tablet TAKE 1 AND 1/2 TABLETS BY MOUTH EVERY MORNING 135 tablet 0   loxapine (LOXITANE) 50 MG capsule TAKE 6 CAPSULES AT BEDTIME (Patient taking differently: 5 capsules nightly) 540 capsule 1   Melatonin 10 MG CAPS Take 10 mg by mouth.     Omega-3 Fatty Acids (FISH OIL) 1200 MG  CAPS Take by mouth.     QUEtiapine (SEROQUEL) 50 MG tablet TAKE 1 TO 2 TABLETS AT NIGHT FOR SLEEP 20 tablet 0   sertraline (ZOLOFT) 100 MG tablet TAKE 2 TABLETS DAILY (Patient taking differently: 1.5 tabs) 180 tablet 1   traZODone (DESYREL) 100 MG tablet TAKE 1 TABLET AT BEDTIME 90 tablet 0   fluticasone (FLONASE) 50 MCG/ACT nasal spray 1 spray by Each Nare route daily.     lithium carbonate (LITHOBID) 300 MG CR tablet Take 2 tablets (600 mg total) by mouth 2 (two) times daily. TAKE 1 AND 1/2 TABLETS IN  THE MORNING AND 2 TABLETS  IN THE EVENING 360 tablet 0   Multiple Vitamins-Minerals (MULTIVITAMIN ADULT) CHEW Chew by mouth. (Patient not taking: Reported on 10/22/2020)     pyridOXINE (VITAMIN B-6) 100 MG tablet Take 100 mg by mouth daily. (Patient not taking: Reported on 07/03/2021)     No current facility-administered medications for this visit.    Medication Side Effects: Other: tremor , grinding teeth daytime, blurred vision, mild right arm posturing, sleep 11 hours.  Not much daytime sleepiness. Stimulating SE Wellbutrin  Allergies:  Allergies  Allergen Reactions   Metformin And Related    Naltrexone     History reviewed. No pertinent past medical history.  History reviewed. No pertinent family history.  Social History   Socioeconomic History   Marital status: Legally Separated    Spouse name: Not on file   Number of children: Not on file   Years of education: Not on file   Highest education level: Not on file  Occupational History   Not on file  Tobacco Use   Smoking status: Never   Smokeless tobacco: Never  Substance and Sexual Activity   Alcohol use: Not on file   Drug use: Not on file   Sexual activity: Not on file  Other Topics Concern   Not on file  Social History Narrative   Not on file   Social Determinants of Health   Financial Resource Strain: Not on file  Food Insecurity: Not on file  Transportation Needs: Not on file  Physical Activity: Not on file   Stress: Not on file  Social Connections: Not on file  Intimate Partner Violence: Not on file    Past Medical History, Surgical history, Social history, and Family history were reviewed and updated as appropriate.   Please see review of systems for further details on the patient's review from today.   Objective:   Physical Exam:  There were no vitals taken for this visit.  Physical Exam Neurological:     Mental Status: She is alert and oriented to person, place, and time.     Cranial Nerves: No dysarthria.  Psychiatric:        Attention and Perception: Attention normal. She perceives auditory hallucinations.        Mood and Affect: Mood is depressed. Mood is not anxious.        Speech: Speech normal.        Behavior: Behavior is slowed. Behavior is cooperative.        Thought Content: Thought content normal. Thought  content is not paranoid or delusional. Thought content does not include homicidal or suicidal ideation. Thought content does not include suicidal plan.        Cognition and Memory: Cognition and memory normal.        Judgment: Judgment normal.     Comments: Insight intact More hypoverbal but not severe    Lab Review:     Component Value Date/Time   NA 138 09/28/2019 0954   K 4.2 09/28/2019 0954   CL 103 09/28/2019 0954   CO2 23 09/28/2019 0954   GLUCOSE 93 09/28/2019 0954   GLUCOSE 99 10/10/2010 1150   BUN 16 09/28/2019 0954   CREATININE 0.81 09/28/2019 0954   CALCIUM 10.3 (H) 09/28/2019 0954   PROT 7.5 10/10/2010 0207   ALBUMIN 4.1 10/10/2010 0207   AST 23 10/10/2010 0207   ALT 19 10/10/2010 0207   ALKPHOS 40 10/10/2010 0207   BILITOT 0.4 10/10/2010 0207   GFRNONAA 94 09/28/2019 0954   GFRAA 108 09/28/2019 0954       Component Value Date/Time   WBC 16.7 (H) 10/10/2010 0207   RBC 4.50 10/10/2010 0207   HGB 12.7 04/14/2011 0956   HCT 40.0 10/10/2010 0223   PLT 225 10/10/2010 0207   MCV 86.7 10/10/2010 0207   MCH 28.7 10/10/2010 0207   MCHC 33.1  10/10/2010 0207   RDW 12.9 10/10/2010 0207   LYMPHSABS 3.3 10/10/2010 0207   MONOABS 1.7 (H) 10/10/2010 0207   EOSABS 0.4 10/10/2010 0207   BASOSABS 0.1 10/10/2010 0207    Lithium Lvl  Date Value Ref Range Status  09/28/2019 1.0 0.6 - 1.2 mmol/L Final    Comment:                                     Detection Limit = 0.1                           <0.1 indicates None Detected     TSH low on Cytomel in June 2020 No results found for: PHENYTOIN, PHENOBARB, VALPROATE, CBMZ  l  Component 03/26/21 10/30/20 07/17/20 04/17/20 03/06/20 02/13/20  Lithium Lvl 1.1 1.1 1.1 1.2 1.2 1.4 High    04/23/21 Cr 0.74  .res Assessment: Plan:    Nolyn was seen today for follow-up, depression and anxiety.  Diagnoses and all orders for this visit:  Schizoaffective disorder, bipolar type (HCC) -     Lithium level -     lithium carbonate (LITHOBID) 300 MG CR tablet; Take 2 tablets (600 mg total) by mouth 2 (two) times daily. TAKE 1 AND 1/2 TABLETS IN  THE MORNING AND 2 TABLETS  IN THE EVENING  Generalized anxiety disorder  Panic disorder with agoraphobia  Extrapyramidal movement disorder, drug-induced  Lithium-induced tremor  Lithium use  Low serum vitamin D  Syncope and collapse  Insomnia due to mental condition   Greater than 50% of 55 minutes face to face time with patient was spent on counseling and coordination of care.  Marygrace is chronically unstable and prone to extrapyramidal side effects complicating treatment. Very complicated patient on polypharmacy and receiving ECT.  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  Patient's mother would really like her  off ECT because of concern it may be affecting memory.  Also there is concern because her depression is  getting worse and none of them want to increase the frequency of ECT to deal with it.  There has been some increased ECT lately but not sufficient response.  Discussed the possibility of clozapine again because whenever she is more depressed she hears voices more.  The voices can tell her to hurt herself but she commits to not doing so.  EPS problems: amantadine 100 mg BID  She is not significantly depressed at this time but has been lately.  She continues maintenance ECT  Start Vraylar.  The benefit may be obscured by the loxapine but cannot stop it all at once.  The hope is this will help bipolar depression enough that she could sing consider stopping ECT.  She and her mother are concerned about ECTs effect on her memory which affects her overall quality of life.  We cannot discontinue the loxapine at this time without worsening hallucinations.  However if the Caplyta is helpful for depression then we will attempt to gradually wean loxapine.  We have discussed that is unusual to use 2 antipsychotics together but in this case it is medically necessary.  Discussed side effects.  For anxiety consider low dose Luvox which has less risk of mood cycling than other SSRIs usually and helps anxiety.  Extensive discussion of clozapine in detail.   Including the risk of severe neutropenia, marked weight gain, sedation, metabolic problems, cardiac risk, etc.  Discussed the need for weekly blood test for at least 6 months.  However we also discussed this is the most effective antipsychotic on the market.  It matter may better control her voices.  She wishes to defer.  Add salt tablets to each meal to stop orthostasis  Most effective option clozapine.    continue sertraline to 1.5 tablets daily  Vraylar increase to 3 mg daily Reduce loxapine to 4 capsules daily.  Her low vitamin D level is supplemented and resolved with supplemental vitamin D.  Low Vitamin D increases the risk of depression  Option  Ozempic or Saxenda for weight loss.  Disc in detail in previous visit. Disc  Counseled patient regarding potential benefits, risks, and side effects of lithium to include potential risk of lithium affecting thyroid and renal function.  Discussed need for periodic lab monitoring to determine drug level and to assess for potential adverse effects.  Counseled patient regarding signs and symptoms of lithium toxicity and advised that they notify office immediately or seek urgent medical attention if experiencing these signs and symptoms.  Patient advised to contact office with any questions or concerns. Being followed by nephro and labs inlcuding lithium levels noted above.   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 No indication to change bc lithium helps her stability so much.  Will continue to coordinate with nephrology; Check lithium level.  Follow-up 4 weeks  Meredith Staggers, MD, DFAPA  Please see After Visit Summary for patient specific instructions.  No future appointments.    Orders Placed This Encounter  Procedures   Lithium level       -------------------------------

## 2021-07-04 ENCOUNTER — Other Ambulatory Visit: Payer: Self-pay | Admitting: Psychiatry

## 2021-07-04 DIAGNOSIS — F250 Schizoaffective disorder, bipolar type: Secondary | ICD-10-CM

## 2021-07-04 MED ORDER — LITHIUM CARBONATE ER 300 MG PO TBCR: EXTENDED_RELEASE_TABLET | ORAL | 0 refills | Status: DC

## 2021-07-04 NOTE — Telephone Encounter (Signed)
Please see note from pharmacy

## 2021-07-08 ENCOUNTER — Telehealth: Payer: Self-pay | Admitting: Psychiatry

## 2021-07-08 NOTE — Telephone Encounter (Signed)
°  Pt left a message that she started on a new medication last week. She is experiencing severe side effects. She is having neck and back pain that is severe. She wants to know does she go back on the old medicine she was on. Give her a call at [phone]

## 2021-07-08 NOTE — Telephone Encounter (Signed)
Yes she can take amantadine once or twice daily

## 2021-07-08 NOTE — Telephone Encounter (Signed)
Pt stated the back and neck pain started 3 days ago and is getting better.She does see benefit from vraylar but wants to know if she can take amantadine with it as well.

## 2021-07-08 NOTE — Telephone Encounter (Signed)
Pt informed

## 2021-07-10 ENCOUNTER — Other Ambulatory Visit: Payer: Self-pay | Admitting: Psychiatry

## 2021-07-10 DIAGNOSIS — F411 Generalized anxiety disorder: Secondary | ICD-10-CM

## 2021-07-10 DIAGNOSIS — F250 Schizoaffective disorder, bipolar type: Secondary | ICD-10-CM

## 2021-07-10 DIAGNOSIS — F4001 Agoraphobia with panic disorder: Secondary | ICD-10-CM

## 2021-07-11 ENCOUNTER — Telehealth: Payer: Self-pay | Admitting: Psychiatry

## 2021-07-11 DIAGNOSIS — F250 Schizoaffective disorder, bipolar type: Secondary | ICD-10-CM

## 2021-07-16 ENCOUNTER — Telehealth: Payer: Self-pay | Admitting: Psychiatry

## 2021-07-16 MED ORDER — CARIPRAZINE HCL 3 MG PO CAPS: 3.0000 mg | ORAL_CAPSULE | Freq: Every day | ORAL | 0 refills | Status: DC

## 2021-07-16 NOTE — Telephone Encounter (Signed)
Pt called and said that she is suppose to see dr, cottle in January due to a medicine change. She has an appt in February. Please call her and let her know if she can be seen in January . [phone]

## 2021-07-16 NOTE — Telephone Encounter (Signed)
Pt needs a a script of vraylar 3 mng to be sent to [facility] [address] in [place]. She has been taking samples

## 2021-07-17 NOTE — Telephone Encounter (Signed)
If there is availability in January can she be seen?or should we add her to the cancellation list?

## 2021-07-18 ENCOUNTER — Telehealth: Payer: Self-pay | Admitting: Psychiatry

## 2021-07-18 NOTE — Telephone Encounter (Signed)
Next visit is 07/22/21. Laura Mathews called and states she is having a side effect from her Vraylar 3 mg. She has been having muscle movements in her throat causing it to be sore. Could someone please call her at [phone].

## 2021-07-18 NOTE — Telephone Encounter (Signed)
Called patient back. She said that she is having the muscles in her neck tensing up, which is making her throat sore. She attributes this to [employer]. When I asked how long this had been an issue she said 3 days. She then told me she had run out of Vraylar and did not have it in her pill box. When she realized she was out she got more and started taking it 3 days ago, but she said she had gone 3-4 days without any. She started back at the 3 mg dose.

## 2021-07-18 NOTE — Telephone Encounter (Signed)
Please let her know that the loxapine is adding to the side effects of Vraylar.  We are attempting to get her off of loxapine and onto Vraylar in hopes of a better antidepressant effect. Therefore reduce the loxapine to 3 capsules daily.  For now we will try to continue the Vraylar at 1 capsule every day.  This is not a high dose  Is she taking the amantadine to help with this particular side effect?  If so what is the effect of the amantadine?

## 2021-07-18 NOTE — Telephone Encounter (Signed)
Called patient with recommendations. She expressed understanding. She said she is taking the amantadine, but she doesn't know what it is for and can't say what SE it may be helping with.

## 2021-07-22 NOTE — Telephone Encounter (Signed)
Can address her work and Friday morning on the sixth or the 13th Friday morning

## 2021-07-22 NOTE — Telephone Encounter (Signed)
Called patient to see how she was doing. She stated she was doing well, but could only walk about a mile. She attributes this to the Vraylar. She says she doesn't have SOB though. She does have an appt with you 1/4.

## 2021-07-22 NOTE — Telephone Encounter (Signed)
Amantadine is for the side effect of neck muscles tensing up which she attributed to the Vraylar.  Of course there are other causes of muscle tension.

## 2021-07-23 ENCOUNTER — Encounter: Payer: Self-pay | Admitting: Psychiatry

## 2021-07-23 ENCOUNTER — Telehealth (INDEPENDENT_AMBULATORY_CARE_PROVIDER_SITE_OTHER): Payer: Medicare Other | Admitting: Psychiatry

## 2021-07-23 DIAGNOSIS — F4001 Agoraphobia with panic disorder: Secondary | ICD-10-CM | POA: Diagnosis not present

## 2021-07-23 DIAGNOSIS — T50905A Adverse effect of unspecified drugs, medicaments and biological substances, initial encounter: Secondary | ICD-10-CM

## 2021-07-23 DIAGNOSIS — G251 Drug-induced tremor: Secondary | ICD-10-CM

## 2021-07-23 DIAGNOSIS — F5105 Insomnia due to other mental disorder: Secondary | ICD-10-CM

## 2021-07-23 DIAGNOSIS — F250 Schizoaffective disorder, bipolar type: Secondary | ICD-10-CM | POA: Diagnosis not present

## 2021-07-23 DIAGNOSIS — F411 Generalized anxiety disorder: Secondary | ICD-10-CM | POA: Diagnosis not present

## 2021-07-23 DIAGNOSIS — G2589 Other specified extrapyramidal and movement disorders: Secondary | ICD-10-CM

## 2021-07-23 DIAGNOSIS — R7989 Other specified abnormal findings of blood chemistry: Secondary | ICD-10-CM

## 2021-07-23 DIAGNOSIS — R55 Syncope and collapse: Secondary | ICD-10-CM

## 2021-07-23 NOTE — Progress Notes (Signed)
Laura Mathews [id number] Aug 20, 1980 41 y.o.   Video Visit via My Chart  I connected with pt by My Chart and verified that I am speaking with the correct person using two identifiers.   I discussed the limitations, risks, security and privacy concerns of performing an evaluation and management service by My Chart  and the availability of in person appointments. I also discussed with the patient that there may be a patient responsible charge related to this service. The patient expressed understanding and agreed to proceed.  I discussed the assessment and treatment plan with the patient. The patient was provided an opportunity to ask questions and all were answered. The patient agreed with the plan and demonstrated an understanding of the instructions.   The patient was advised to call back or seek an in-person evaluation if the symptoms worsen or if the condition fails to improve as anticipated.  I provided 30 minutes of video time during this encounter.  The patient was located at home and the provider was located office. Session from 5 until 530  Subjective:   Patient ID:  Laura Mathews is a 41 y.o. (DOB 21-Oct-1980) female.  Chief Complaint:  Chief Complaint  Patient presents with   Follow-up   Schizoaffective disorder, bipolar type      [name] presents  today for follow-up of schizoaffective depression with hallucinations and anxiety.  When seen August 2020.  We reduced the Cytomel to 37.5 mcg bc of suppressed TSH.  She's noticed no problems.  At visit September 19, 2018.  At that visit we reduced Wellbutrin XL 300 to 150 because of anxiety and recently high blood pressure.    At her visit in April 2020.  She reported no problems from reducing the Wellbutrin and that her anxiety was somewhat better.  There were no changes made at the visit in April.  At visit October 2020.  No meds were changed.  [facility] which is a significant risk for this patient. She will try to access it.  Overall Loxapine has been very good to me but some movement disorder issues with toes moving, teeth grinding, Arm stiffness R, swallow involuntary movements.  seen May 25, 2019.  She remained on loxapine 300 mg.  Under stress she was still having hallucinations and she was given olanzapine 10 mg sublingual as needed auditory hallucinations.  She was continued on Wellbutrin XL 150 mg.  She was continued on Cytomel 37.5 mg every morning.  Also continued lithium 1200 mg daily and lamotrigine 200 mg daily. There was concern about the loxapine shortage and that she might have to be switched to another medication hopefully with low EPS risk such as Fanapt.  But she has a history of orthostatic hypotension and it is unclear if she could tolerate that medication.  seen July 28, 2019.  Because of the shortage of loxapine we had to wean and discontinue that medication.  She was given Caplyta as an alternative antipsychotic given multiple other failures and her tendency to be very sensitive to EPS and tardive dyskinesia. Patient took it for about 3 weeks and then called February 1 stating that she was having nausea and involuntary facial movements and had found loxapine and wanted to return to it.  Therefore Caplyta was stopped and she was restarted on loxapine to quickly titrate back up to 300 mg nightly the same dose that she is taken in the past.  seen  FEB 8 seen with her mother.  Ran out of [place] for 3 days bc pharmacy didn't have it.  Just back to 300 mg loxapine as of last night.  Slept fine on Caplyta with less duration needed. Mood pretty stable and not markedly depressed.  Today feels a ltttle psychotic with voices and feeling people are inside her a little confusion.  Movements from mouth have seemed better.  Has felt a little tight throat that scared her.  Mo notes facial grimacing and lip pursing has almost totally  gone.   Mood really well overall with ECT reduced to once monthly. Reports getting "modified bilateral"  ECT.   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 Twin sister had baby boy .  Laura Mathews is doing well.  Applying for better job.  Some are in other states.  Not worrying about it.  Together for 13 years.    As of October 02, 2019, Really well overall.   Not manic and "not too psychotic".  Voices down to mostly noise.   Patient reports stable mood and denies depressed or irritable moods.  Anxiety is better and using clonazepam or Seroquel.    Walking more has helped and less depression improved motivation. Patient denies difficulty with sleep initiation or maintenance. Denies appetite disturbance.  Patient reports that energy and motivation have been good.  Patient denies any difficulty with concentration.  Patient denies any suicidal ideation.  No recent panic.   Quiet noise. No med changes.  11/29/19 appt the following noted: Needs trazodone and Seroquel to sleep.  Rare olanzapine for AH.  Still trouble falling asleep with meds and can lay in bed for hours.  Will sleep late to make up for it.   Otherwise well without mania nor depression.  Occ psychotic sx with stressors.  Nephro will leave her on lithium and start amiloride for Nephrogenic Diabetes Insipidus.  Has started and will fu with labs in 2 weeks.Sx polydipsia and polyuria and thirst.  Couple of accidents and 1 in public.  Said renal function was good otherwise.  No SI in a long time.   Tutoring 3 hours weekly.  Enjoys it.   Laura Mathews is not planning a job change at this time.  Will sign lease for apt for another year. Plan: no med changes  05/07/2020 appointment with the following noted: Continued all meds.  Still doing maintenance ECT monthly. No olanzapine lately.  Only twice since had it.   For the most part don't hear voices unless under severe  stress. Mood happy without depression. Doing deep breathing from DBT to help with anxiety.  I'm still an anxious person. 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   CO weight gain since here and thinks it's from using the Seroquel 50-100 mg  for sleep and now trying to use less. Sleep well with Seroquel and Klonopin.  SE EPS bc walking with arms bent and this is leading to some pain R >L.  Can straighten it out.  08/06/2020 appt with following noted: No olanzapine used in months. Well overall.  Mood pretty balanced now. No mania or depression now. Last had depression after severe UTI. Not sure when but anxiety worse in the am and hard to get off the couch until early afternoon.  No specific thoughts or fears associated.   Still going to [employer] to meet friends. Not much caffeine 1  cup am.   Sleep really well.   Started Noom and lost 7 # so far.   Voices only problem when stressed.   Plan: Hold  Wellbutrin 150 for 1 week to see if anxiety is better .    10/02/2020 phone call from patient complaining of worsening depression with suicidal thoughts without intent. Nurse note:Rtc to pt and she reports having increased depression for about 1 week now, she had ECT on Friday 3/11 and will have ECT again this Friday the 18th. She has been waking up earlier then usual and unable to fall asleep, about an 1-1/2 hours early.  Asking if there is anything she can do to help? Suicidal thoughts but nothing specific or any intent MD response:Lithium level checked on 09/27/2020 was 1.0.  Therefore no room to increase that dosage. On sertraline 200 mg daily so no room to increase that dosage. At the last appointment, I asked her to hold the Wellbutrin for a week to see if her anxiety was better.  I do not know if she stopped it or restarted it.  If she stopped it have her restart the Wellbutrin XL 150 mg 1 AM.  If she is still taking it have her increase the Wellbutrin XL to 1-1/2 tablets every  morning.  The bottle will say not to cut the tablets but because her dosage is low it is okay to cut the tablets if needed. Nurse contact with patient:Rtc to pt and she reports she did stop it but restarted it after 2 weeks, advised her to increase Wellbutrin XL to 1.5 tablets of 150 mg tablet. She will call back with worsening symptoms  10/22/2020 appointment with the following noted: Doing a lot better with depression.  Don't feel depressed anymore.  Had extra ECT 2 per week for 3 weeks. Ketamine used and triggered hallucinations for a couple of weeks which were visual shapes and not scary.   Feels like heart is beating too fast and hard to relax and harder to sleep. While off Wellbutrin anxiety feelings got better. Asks if other treatment options exist for depression besides ECT. Mom on session says her memory is Terrible DT ECT. Getting modified bilateral ECT. Plan: In hopes of getting better control of depression with the eventual goal of stopping ECT we will make the following change, DC Wellbutrin  And start Caplyta 42 mg daily. .  11/27/2020 appointment with the following noted:  Seen with mother Patient hospitalized 11/20/2020 [facility] health care for suicidal ideation and depression. ECT 4/29.  Then dep with SI 5/3 and went to ER.  Had ECT and felt better on 11/22/20.   SI seemed to come on gradually.  A lot of stress lately.  Some voices lately.   They thought the ketamine was causing hallucinations and it was changed without full seizue and then switched to something else and then back to ketamine.   Depressed but not suicidal and no where near suicidal.   Doesn't remember any effect of switch to Caplyta.   Plan: Reduce Loxapine  With goal of going on Caplyta alone.  Caplyta might work better for depression without the loxapine. Reduce loxapine to 5 capsules daily for 2 weeks,  Then reduce to 4 capsules daily for 2 weeks,  Then reduce to 3 capsules daily. Disc risk increased AH.  02/17/2021  appointment with the following noted: BF David on the session Got psychotic off loxapine and had to raise dose to 300 mg HS.  No hospitalization. Stopped  Caplyta 3 days ago.  Has had excessive sleepiness.   Get lightheaded and dizzy and feels exhausted.  BF says she's collapsed at times. Some of the time she feels anxious but not all the time. Better mood in [place] last couple of weeks.  Has had a lot of depression in the last few weeks before the trip. Still some tiredness.   CO stiff arms walking and hurts to walk. Likes loxapine with control voices generally. SE overall been OK until lately some neck and back pain ? Related.    06/20/21 appt noted:  seen with H and M Really depressed, anxious and psychotic.  Voices worse when depressed and anxious. ECT every 2-3 weeks since march.   Voices of people and God tell her to hurt herself and I don't want to.  Trying to act like a normal person.  2 weeks ago stopped  amantadine bc SE and not hlelping that much. Missing work a lot lately with anxiety and depression and pain in arms and legs she thinks related to stopping amantadine.  Passing out also more the past month per Laura Mathews but Jerilynn Mages thinks it has been longer. M concerns frequency of ECT and memory, pain episodes that come and go maybe related to meds, and needs med change. BF notices generally worse over the last month but sees it as cyclical.  0000000 appt noted: Had ECT last 2 weeks. On Vraylar 1.5 mg daily and will increase to 3 mg next week. Some stiffness in neck and arms. Less depressed than she was.  Voices are a lot better so far but not gone completely. Plan: continue sertraline to 1.5 tablets daily  Vraylar increase to 3 mg daily Reduce loxapine to 4 capsules daily.  07/23/2021 appt noted: Reduced loxapine to 150 mg daily and Vraylar 3 mg daily. At first neck pain but now it is better.   Mood has been good.  Getting more things done like cleaning and cooking. Also had increased  treatments of ECT. Next sched is 10 days from now. Still some stiffness and posturing arms R over left. Voices are ok unless really stressed out and there's noise at the same time. Sleep is harder falling asleep.  Needing some Seroquel and occ Klonopin.   Memory is still bad. Not much dizziness or falling.     Psychiatric medication history includes risperidone 3.5 mg EPS,  Abilify 30 mg withdrawal dyskinesia, perphenazine, Geodon, olanzapine, Saphris 5 mg, Haldol, Latuda 160, loxapine 300 mg, Seroquel constipation and U px,   Caplyta NR lamotrigine, Depakote, lithium, clonazepam,  Fetzima, sertraline, Pristiq, paroxetine,  citalopram, Lexapro,  Cytomel, topiramate, buspirone, gabapentin, trazodone  propranolol with no response for anxiety benztropine ECT maintenance Under our psychiatric care since 2006.  Remote history of OD Klonopin.  Review of Systems:  Review of Systems  Constitutional:  Positive for fatigue. Negative for unexpected weight change.  Cardiovascular:  Negative for chest pain and palpitations.  Endocrine: Positive for polyuria.  Genitourinary:  Negative for urgency.  Musculoskeletal:  Positive for back pain, myalgias and neck pain.  Neurological:  Positive for dizziness and tremors. Negative for weakness and headaches.  Psychiatric/Behavioral:  Positive for decreased concentration. Negative for agitation, behavioral problems, confusion, dysphoric mood, hallucinations, self-injury, sleep disturbance and suicidal ideas. The patient is not nervous/anxious and is not hyperactive.    Medications: I have reviewed the patient's current medications.  Current Outpatient Medications  Medication Sig Dispense Refill   aMILoride (MIDAMOR) 5 MG tablet Take 5 mg  by mouth daily.     b complex vitamins tablet Take 1 tablet by mouth daily.     cariprazine (VRAYLAR) 3 MG capsule Take 1 capsule (3 mg total) by mouth daily. 30 capsule 0   Cholecalciferol (VITAMIN D3) 5000 units CAPS  Take 1 capsule by mouth daily.     clonazePAM (KLONOPIN) 0.5 MG tablet TAKE 1 TABLET BY MOUTH TWICE A DAY AND TAKE 2 TABLETS AT BEDTIME AS NEEDED 60 tablet 0   lamoTRIgine (LAMICTAL) 200 MG tablet TAKE 1 TABLET DAILY 90 tablet 1   liothyronine (CYTOMEL) 25 MCG tablet TAKE 1 AND 1/2 TABLETS BY MOUTH EVERY MORNING 135 tablet 0   lithium carbonate (LITHOBID) 300 MG CR tablet TAKE 1 AND 1/2 TABLETS IN  THE MORNING AND 2 TABLETS  IN THE EVENING 315 tablet 0   loxapine (LOXITANE) 50 MG capsule TAKE 6 CAPSULES AT BEDTIME (Patient taking differently: 3 capsules nightly) 540 capsule 1   Melatonin 10 MG CAPS Take 10 mg by mouth.     Omega-3 Fatty Acids (FISH OIL) 1200 MG CAPS Take by mouth.     QUEtiapine (SEROQUEL) 50 MG tablet TAKE 1 TO 2 TABLETS AT NIGHT FOR SLEEP 20 tablet 0   sertraline (ZOLOFT) 100 MG tablet TAKE 2 TABLETS DAILY (Patient taking differently: 1.5 tabs) 180 tablet 1   traZODone (DESYREL) 100 MG tablet TAKE 1 TABLET AT BEDTIME 90 tablet 0   No current facility-administered medications for this visit.    Medication Side Effects: Other: tremor , grinding teeth daytime, blurred vision, mild right arm posturing, sleep 11 hours.  Not much daytime sleepiness. Stimulating SE Wellbutrin  Allergies:  Allergies  Allergen Reactions   Metformin And Related    Naltrexone     History reviewed. No pertinent past medical history.  History reviewed. No pertinent family history.  Social History   Socioeconomic History   Marital status: Legally Separated    Spouse name: Not on file   Number of children: Not on file   Years of education: Not on file   Highest education level: Not on file  Occupational History   Not on file  Tobacco Use   Smoking status: Never   Smokeless tobacco: Never  Substance and Sexual Activity   Alcohol use: Not on file   Drug use: Not on file   Sexual activity: Not on file  Other Topics Concern   Not on file  Social History Narrative   Not on file    Social Determinants of Health   Financial Resource Strain: Not on file  Food Insecurity: Not on file  Transportation Needs: Not on file  Physical Activity: Not on file  Stress: Not on file  Social Connections: Not on file  Intimate Partner Violence: Not on file    Past Medical History, Surgical history, Social history, and Family history were reviewed and updated as appropriate.   Please see review of systems for further details on the patient's review from today.   Objective:   Physical Exam:  There were no vitals taken for this visit.  Physical Exam Neurological:     Mental Status: She is alert and oriented to person, place, and time.     Cranial Nerves: No dysarthria.  Psychiatric:        Attention and Perception: Attention normal. She perceives auditory hallucinations.        Mood and Affect: Mood is depressed. Mood is not anxious.        Speech: Speech  normal.        Behavior: Behavior is not slowed. Behavior is cooperative.        Thought Content: Thought content normal. Thought content is not paranoid or delusional. Thought content does not include homicidal or suicidal ideation. Thought content does not include suicidal plan.        Cognition and Memory: Cognition and memory normal.        Judgment: Judgment normal.     Comments: Insight intact Less hypoverbal  AH with stress    Lab Review:     Component Value Date/Time   NA 138 09/28/2019 0954   K 4.2 09/28/2019 0954   CL 103 09/28/2019 0954   CO2 23 09/28/2019 0954   GLUCOSE 93 09/28/2019 0954   GLUCOSE 99 10/10/2010 1150   BUN 16 09/28/2019 0954   CREATININE 0.81 09/28/2019 0954   CALCIUM 10.3 (H) 09/28/2019 0954   PROT 7.5 10/10/2010 0207   ALBUMIN 4.1 10/10/2010 0207   AST 23 10/10/2010 0207   ALT 19 10/10/2010 0207   ALKPHOS 40 10/10/2010 0207   BILITOT 0.4 10/10/2010 0207   GFRNONAA 94 09/28/2019 0954   GFRAA 108 09/28/2019 0954       Component Value Date/Time   WBC 16.7 (H) 10/10/2010  0207   RBC 4.50 10/10/2010 0207   HGB 12.7 04/14/2011 0956   HCT 40.0 10/10/2010 0223   PLT 225 10/10/2010 0207   MCV 86.7 10/10/2010 0207   MCH 28.7 10/10/2010 0207   MCHC 33.1 10/10/2010 0207   RDW 12.9 10/10/2010 0207   LYMPHSABS 3.3 10/10/2010 0207   MONOABS 1.7 (H) 10/10/2010 0207   EOSABS 0.4 10/10/2010 0207   BASOSABS 0.1 10/10/2010 0207    Lithium Lvl  Date Value Ref Range Status  09/28/2019 1.0 0.6 - 1.2 mmol/L Final    Comment:                                     Detection Limit = 0.1                           <0.1 indicates None Detected     TSH low on Cytomel in June 2020 No results found for: PHENYTOIN, PHENOBARB, VALPROATE, CBMZ  l  Component 03/26/21 10/30/20 07/17/20 04/17/20 03/06/20 02/13/20  Lithium Lvl 1.1 1.1 1.1 1.2 1.2 1.4 High    04/23/21 Cr 0.74  .res Assessment: Plan:    Sheyli was seen today for follow-up and schizoaffective disorder, bipolar type.  Diagnoses and all orders for this visit:  Schizoaffective disorder, bipolar type ([place])  Generalized anxiety disorder  Panic disorder with agoraphobia  Extrapyramidal movement disorder, drug-induced  Lithium-induced tremor  Low serum vitamin D  Insomnia due to mental condition  Syncope and collapse    Greater than 50% of 30 minutes face to face time with patient was spent on counseling and coordination of care.  [place] is chronically unstable and prone to extrapyramidal side effects complicating treatment. Very complicated patient on polypharmacy and receiving ECT.  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  Patient's mother would really like her off ECT because of concern it may be affecting memory.  Also there is concern because her  depression is getting worse  and none of them want to increase the frequency of ECT to deal with it.  There has been some increased ECT lately but not sufficient response. Ongoing ECT in [facility]  Discussed the possibility of clozapine again because whenever she is more depressed she hears voices more.  The voices can tell her to hurt herself but she commits to not doing so.  EPS problems: amantadine 100 mg BID  She is not significantly depressed at this time but has been lately.  She continues maintenance ECT  Start Vraylar.  The benefit may be obscured by the loxapine but cannot stop it all at once.  The hope is this will help bipolar depression enough that she could sing consider stopping ECT.  She and her mother are concerned about ECTs effect on her memory which affects her overall quality of life.  We cannot discontinue the loxapine at this time without worsening hallucinations.  However if the Caplyta is helpful for depression then we will attempt to gradually wean loxapine.  We have discussed that is unusual to use 2 antipsychotics together but in this case it is medically necessary.  Discussed side effects.  For anxiety consider low dose Luvox which has less risk of mood cycling than other SSRIs usually and helps anxiety.  Extensive discussion of clozapine in detail.   Including the risk of severe neutropenia, marked weight gain, sedation, metabolic problems, cardiac risk, etc.  Discussed the need for weekly blood test for at least 6 months.  However we also discussed this is the most effective antipsychotic on the market.  It matter may better control her voices.  She wishes to defer.  salt tablets to each meal to stop orthostasis  Most effective option clozapine.    continue sertraline to 1.5 tablets daily  Vraylar 3 mg daily Reduce loxapine to 2 of the 50 mg  capsules daily for 1 week, then 1 daily for a week then stop it.. If voices get worse call.    May need to take some limited SERoquel dose for sleep esp  off loxapine  Her low vitamin D level is supplemented and resolved with supplemental vitamin D.  Low Vitamin D increases the risk of depression  Option Ozempic or Saxenda for weight loss.  Disc in detail in previous visit. Disc  Counseled patient regarding potential benefits, risks, and side effects of lithium to include potential risk of lithium affecting thyroid and renal function.  Discussed need for periodic lab monitoring to determine drug level and to assess for potential adverse effects.  Counseled patient regarding signs and symptoms of lithium toxicity and advised that they notify office immediately or seek urgent medical attention if experiencing these signs and symptoms.  Patient advised to contact office with any questions or concerns. Being followed by nephro and labs inlcuding lithium levels noted above.   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 No indication to change bc lithium helps her stability so much.  Will continue to coordinate with nephrology; Check lithium level.  Follow-up 4 weeks  Lynder Parents, MD, DFAPA  Please see After Visit Summary for patient specific instructions.  Future Appointments  Date Time Provider [place]  08/21/2021  4:00 PM Cottle, Billey Co., MD CP-CP None      No orders of the defined types were placed in this encounter.      -------------------------------

## 2021-07-25 LAB — LITHIUM LEVEL: Lithium Lvl: 1.1 mmol/L (ref 0.5–1.2)

## 2021-07-29 ENCOUNTER — Telehealth: Payer: Self-pay | Admitting: Psychiatry

## 2021-07-29 NOTE — Telephone Encounter (Signed)
Pt left a message that she is having a hard time falling asleep. She is taking trazadone,2 bendryl 4 25 mg seroquel and 2 klonopin to fall asleep. Please call her and let her know wht she can do. She doesn;t want to take this much meds to fall asleep. Please call pt at [phone]

## 2021-07-29 NOTE — Telephone Encounter (Signed)
Pt stated she did not have trouble falling asleep until starting the vraylar,which she takes at 8am.She is only able to fall asleep and stay asleep with the combination of meds mentioned above.

## 2021-07-29 NOTE — Telephone Encounter (Signed)
Vraylar commonly causes insomnia when he first started.  When she gets this past 2 or 3 weeks more the problem will go away.  She can take the meds she is currently taking for sleep until the problem resolves.  The insomnia from Leafy Kindle will go away.  She just needs more time.  It is a very good medicine I saw the patient today who had the same problem but now the insomnia has resolved.

## 2021-07-30 NOTE — Telephone Encounter (Signed)
Pt informed

## 2021-08-05 ENCOUNTER — Telehealth: Payer: Self-pay | Admitting: Psychiatry

## 2021-08-05 NOTE — Telephone Encounter (Signed)
Pt lm requesting refills on the Vraylar and the Clonazepam. [facility] at [facility] [address] [place]. Patient is scheduled for a follow up on 2/2.

## 2021-08-05 NOTE — Telephone Encounter (Signed)
On 12/26 you only sent #60 of the klonopin but the rx read she can take up to 4 a day.Did you mean to send this quantity?She will need a new rx if not

## 2021-08-06 ENCOUNTER — Other Ambulatory Visit: Payer: Self-pay

## 2021-08-06 MED ORDER — CARIPRAZINE HCL 3 MG PO CAPS: 3.0000 mg | ORAL_CAPSULE | Freq: Every day | ORAL | 0 refills | Status: DC

## 2021-08-07 ENCOUNTER — Other Ambulatory Visit: Payer: Self-pay | Admitting: Psychiatry

## 2021-08-07 DIAGNOSIS — F4001 Agoraphobia with panic disorder: Secondary | ICD-10-CM

## 2021-08-07 DIAGNOSIS — F411 Generalized anxiety disorder: Secondary | ICD-10-CM

## 2021-08-07 MED ORDER — CARIPRAZINE HCL 3 MG PO CAPS
3.0000 mg | ORAL_CAPSULE | Freq: Every day | ORAL | 0 refills | Status: DC
Start: 2021-08-07 — End: 2021-08-28

## 2021-08-07 MED ORDER — CLONAZEPAM 0.5 MG PO TABS: ORAL_TABLET | ORAL | 0 refills | Status: AC

## 2021-08-07 NOTE — Telephone Encounter (Signed)
sent 

## 2021-08-12 ENCOUNTER — Ambulatory Visit: Payer: Medicare Other | Admitting: Psychiatry

## 2021-08-14 ENCOUNTER — Ambulatory Visit: Payer: Medicare Other | Admitting: Psychiatry

## 2021-08-15 ENCOUNTER — Telehealth: Payer: Self-pay | Admitting: Psychiatry

## 2021-08-15 NOTE — Telephone Encounter (Signed)
Pt called and said that she was in nthe ER on Sunday with an ulcer. She is scheduled to have an endoscopy in a few weeks. Her body is retaining fluid and yesterday she had to have a catheter put in. They feel that her retaining of fluid is from the seroquel. Please give her  a call at [phone]

## 2021-08-15 NOTE — Telephone Encounter (Signed)
I will give her a call Monday morning.

## 2021-08-17 ENCOUNTER — Other Ambulatory Visit: Payer: Self-pay | Admitting: Psychiatry

## 2021-08-17 ENCOUNTER — Encounter: Payer: Self-pay | Admitting: Psychiatry

## 2021-08-17 DIAGNOSIS — G2589 Other specified extrapyramidal and movement disorders: Secondary | ICD-10-CM

## 2021-08-17 DIAGNOSIS — T50905A Adverse effect of unspecified drugs, medicaments and biological substances, initial encounter: Secondary | ICD-10-CM

## 2021-08-18 NOTE — Telephone Encounter (Signed)
Rtc to pt and she has been off Loxapine for about a week she reports. She has not gone any higher on her Trazodone 100 mg before. She also can not take Klonopin every night because she builds up a tolerance with it.

## 2021-08-18 NOTE — Telephone Encounter (Signed)
I already spoke to pt this morning before Dr. Clovis Pu sent a note. Pt is taking 100 mg Seroquel and 100 mg Trazodone for sleep she is asking for something else to replace Seroquel as she tapers off of it. Has apt Thursday. I will contact pt back soon since Dr. Clovis Pu asked more questions.   She was treated with Carafate for her ulcer and they cathed her for her urinary retention.

## 2021-08-18 NOTE — Telephone Encounter (Signed)
Overall we are attempting to wean her off loxapine and get her on Vraylar in hopes of better controlling her bipolar depression.  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  Leafy Kindle is a better medicine for depression then is loxapine.  In this transition she has had more trouble sleeping.  Seroquel was prescribed at 50 mg tablets 1-2  at night to deal with the insomnia which was not responsive to trazodone.  In the process of weaning loxapine what is her current dose?  How much trazodone is she taking?  Has she tried a higher dose of trazodone in place of the Seroquel?  How much Seroquel is she taking?  Also I assume she is on a stable dose of Klonopin at night?  Apparently the Seroquel is causing urinary retention which it has done at higher dosages in the past.  However cut the dose by 75%.  If that does not stop the urinary problem within 2 days then stop the Seroquel.

## 2021-08-18 NOTE — Telephone Encounter (Signed)
There is nothing that I can send in for her insomnia before her appointment in 2 days.  But she needs to dramatically reduce the Seroquel in order to avoid the urinary retention and the necessity of bladder catheterization.  Therefore reduce the Seroquel to 25 mg or one half of a 50 mg tablet for the next 2 days.  She can increase the trazodone if needed to 1-1/2 tablets at night.  We will discuss the plan at her appointment

## 2021-08-19 ENCOUNTER — Telehealth: Payer: Self-pay | Admitting: Psychiatry

## 2021-08-19 DIAGNOSIS — F411 Generalized anxiety disorder: Secondary | ICD-10-CM

## 2021-08-19 DIAGNOSIS — F4001 Agoraphobia with panic disorder: Secondary | ICD-10-CM

## 2021-08-19 NOTE — Telephone Encounter (Signed)
LVM to rtc 

## 2021-08-19 NOTE — Telephone Encounter (Signed)
Gave her message from yesterday

## 2021-08-19 NOTE — Telephone Encounter (Signed)
See previous phone message with Dr. Jennelle Human response after I spoke with her yesterday

## 2021-08-19 NOTE — Telephone Encounter (Signed)
Next visit is 08/21/21.  Laura Mathews called stating that she is having difficulty sleeping with the Seroquel . She said she is retaining fluid and had to use a catheter for a little while because of the fluid retention. She said she needs something else to take. She said no one called her back on this last call she made to [place] on 08/15/21. Please call her back at [phone].

## 2021-08-20 NOTE — Telephone Encounter (Signed)
Pt was notified with information

## 2021-08-21 ENCOUNTER — Telehealth (INDEPENDENT_AMBULATORY_CARE_PROVIDER_SITE_OTHER): Payer: No Typology Code available for payment source | Admitting: Psychiatry

## 2021-08-21 ENCOUNTER — Encounter: Payer: Self-pay | Admitting: Psychiatry

## 2021-08-21 DIAGNOSIS — F250 Schizoaffective disorder, bipolar type: Secondary | ICD-10-CM | POA: Diagnosis not present

## 2021-08-21 DIAGNOSIS — E559 Vitamin D deficiency, unspecified: Secondary | ICD-10-CM

## 2021-08-21 DIAGNOSIS — F411 Generalized anxiety disorder: Secondary | ICD-10-CM | POA: Diagnosis not present

## 2021-08-21 DIAGNOSIS — G251 Drug-induced tremor: Secondary | ICD-10-CM

## 2021-08-21 DIAGNOSIS — F4001 Agoraphobia with panic disorder: Secondary | ICD-10-CM

## 2021-08-21 DIAGNOSIS — Z79899 Other long term (current) drug therapy: Secondary | ICD-10-CM

## 2021-08-21 DIAGNOSIS — G2589 Other specified extrapyramidal and movement disorders: Secondary | ICD-10-CM | POA: Diagnosis not present

## 2021-08-21 DIAGNOSIS — F5105 Insomnia due to other mental disorder: Secondary | ICD-10-CM

## 2021-08-21 DIAGNOSIS — R55 Syncope and collapse: Secondary | ICD-10-CM

## 2021-08-21 DIAGNOSIS — N251 Nephrogenic diabetes insipidus: Secondary | ICD-10-CM

## 2021-08-21 NOTE — Progress Notes (Signed)
[name] 08-10-1980 41 y.o.   Video Visit via My Chart  I connected with pt by My Chart and verified that I am speaking with the correct person using two identifiers.   I discussed the limitations, risks, security and privacy concerns of performing an evaluation and management service by My Chart  and the availability of in person appointments. I also discussed with the patient that there may be a patient responsible charge related to this service. The patient expressed understanding and agreed to proceed.  I discussed the assessment and treatment plan with the patient. The patient was provided an opportunity to ask questions and all were answered. The patient agreed with the plan and demonstrated an understanding of the instructions.   The patient was advised to call back or seek an in-person evaluation if the symptoms worsen or if the condition fails to improve as anticipated.  I provided 30 minutes of video time during this encounter.  The patient was located at home and the provider was located office. Session from 4 until 4:30 PM  Subjective:   Patient ID:  Laura Mathews is a 41 y.o. (DOB 11-24-1980) female.  Chief Complaint:  Chief Complaint  Patient presents with   Follow-up   Depression   Anxiety   Medication Problem   Sleeping Problem      Laura Mathews presents  today for follow-up of schizoaffective depression with hallucinations and anxiety.  When seen August 2020.  We reduced the Cytomel to 37.5 mcg bc of suppressed TSH.  She's noticed no problems.  At visit September 19, 2018.  At that visit we reduced Wellbutrin XL 300 to 150 because of anxiety and recently high blood pressure.    At her visit in April 2020.  She reported no problems from reducing the Wellbutrin and that her anxiety was somewhat better.  There were no changes made at the visit in April.  At visit October 2020.  No meds were changed.  Have gotten notice  about [facility] not having access to loxapine which is a significant risk for this patient. She will try to access it.  Overall Loxapine has been very good to me but some movement disorder issues with toes moving, teeth grinding, Arm stiffness R, swallow involuntary movements.  seen May 25, 2019.  She remained on loxapine 300 mg.  Under stress she was still having hallucinations and she was given olanzapine 10 mg sublingual as needed auditory hallucinations.  She was continued on Wellbutrin XL 150 mg.  She was continued on Cytomel 37.5 mg every morning.  Also continued lithium 1200 mg daily and lamotrigine 200 mg daily. There was concern about the loxapine shortage and that she might have to be switched to another medication hopefully with low EPS risk such as Fanapt.  But she has a history of orthostatic hypotension and it is unclear if she could tolerate that medication.  seen July 28, 2019.  Because of the shortage of loxapine we had to wean and discontinue that medication.  She was given Caplyta as an alternative antipsychotic given multiple other failures and her tendency to be very sensitive to EPS and tardive dyskinesia. Patient took it for about 3 weeks and then called February 1 stating that she was having nausea and involuntary facial movements and had found loxapine and wanted to return to it.  Therefore Caplyta was stopped and she was restarted on loxapine to quickly titrate back up to 300 mg nightly the same dose  that she is taken in the past.  seen FEB 8 seen with her mother.  Ran out of [place] for 3 days bc pharmacy didn't have it.  Just back to 300 mg loxapine as of last night.  Slept fine on Caplyta with less duration needed. Mood pretty stable and not markedly depressed.  Today feels a ltttle psychotic with voices and feeling people are inside her a little confusion.  Movements from mouth have seemed better.  Has felt a little tight throat that scared her.  Mo notes facial grimacing and  lip pursing has almost totally gone.   Mood really well overall with ECT reduced to once monthly. Reports getting "modified bilateral"  ECT.   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 Twin sister had baby boy .  Laura Mathews is doing well.  Applying for better job.  Some are in other states.  Not worrying about it.  Together for 13 years.    As of October 02, 2019, Really well overall.   Not manic and "not too psychotic".  Voices down to mostly noise.   Patient reports stable mood and denies depressed or irritable moods.  Anxiety is better and using clonazepam or Seroquel.    Walking more has helped and less depression improved motivation. Patient denies difficulty with sleep initiation or maintenance. Denies appetite disturbance.  Patient reports that energy and motivation have been good.  Patient denies any difficulty with concentration.  Patient denies any suicidal ideation.  No recent panic.   Quiet noise. No med changes.  11/29/19 appt the following noted: Needs trazodone and Seroquel to sleep.  Rare olanzapine for AH.  Still trouble falling asleep with meds and can lay in bed for hours.  Will sleep late to make up for it.   Otherwise well without mania nor depression.  Occ psychotic sx with stressors.  Nephro will leave her on lithium and start amiloride for Nephrogenic Diabetes Insipidus.  Has started and will fu with labs in 2 weeks.Sx polydipsia and polyuria and thirst.  Couple of accidents and 1 in public.  Said renal function was good otherwise.  No SI in a long time.   Tutoring 3 hours weekly.  Enjoys it.   Laura Mathews is not planning a job change at this time.  Will sign lease for apt for another year. Plan: no med changes  05/07/2020 appointment with the following noted: Continued all meds.  Still doing maintenance ECT monthly. No olanzapine lately.  Only twice since had it.   For the most part don't hear voices  unless under severe stress. Mood happy without depression. Doing deep breathing from DBT to help with anxiety.  I'm still an anxious person. 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   CO weight gain since here and thinks it's from using the Seroquel 50-100 mg  for sleep and now trying to use less. Sleep well with Seroquel and Klonopin.  SE EPS bc walking with arms bent and this is leading to some pain R >L.  Can straighten it out.  08/06/2020 appt with following noted: No olanzapine used in months. Well overall.  Mood pretty balanced now. No mania or depression now. Last had depression after severe UTI. Not sure when but anxiety worse in the am and hard to get off the couch until early afternoon.  No specific thoughts or fears associated.   Still going  to [employer] to meet friends. Not much caffeine 1 cup am.   Sleep really well.   Started Noom and lost 7 # so far.   Voices only problem when stressed.   Plan: Hold  Wellbutrin 150 for 1 week to see if anxiety is better .    10/02/2020 phone call from patient complaining of worsening depression with suicidal thoughts without intent. Nurse note:Rtc to pt and she reports having increased depression for about 1 week now, she had ECT on Friday 3/11 and will have ECT again this Friday the 18th. She has been waking up earlier then usual and unable to fall asleep, about an 1-1/2 hours early.  Asking if there is anything she can do to help? Suicidal thoughts but nothing specific or any intent MD response:Lithium level checked on 09/27/2020 was 1.0.  Therefore no room to increase that dosage. On sertraline 200 mg daily so no room to increase that dosage. At the last appointment, I asked her to hold the Wellbutrin for a week to see if her anxiety was better.  I do not know if she stopped it or restarted it.  If she stopped it have her restart the Wellbutrin XL 150 mg 1 AM.  If she is still taking it have her increase the Wellbutrin XL to  1-1/2 tablets every morning.  The bottle will say not to cut the tablets but because her dosage is low it is okay to cut the tablets if needed. Nurse contact with patient:Rtc to pt and she reports she did stop it but restarted it after 2 weeks, advised her to increase Wellbutrin XL to 1.5 tablets of 150 mg tablet. She will call back with worsening symptoms  10/22/2020 appointment with the following noted: Doing a lot better with depression.  Don't feel depressed anymore.  Had extra ECT 2 per week for 3 weeks. Ketamine used and triggered hallucinations for a couple of weeks which were visual shapes and not scary.   Feels like heart is beating too fast and hard to relax and harder to sleep. While off Wellbutrin anxiety feelings got better. Asks if other treatment options exist for depression besides ECT. Mom on session says her memory is Terrible DT ECT. Getting modified bilateral ECT. Plan: In hopes of getting better control of depression with the eventual goal of stopping ECT we will make the following change, DC Wellbutrin  And start Caplyta 42 mg daily. .  11/27/2020 appointment with the following noted:  Seen with mother Patient hospitalized 11/20/2020 [facility] health care for suicidal ideation and depression. ECT 4/29.  Then dep with SI 5/3 and went to ER.  Had ECT and felt better on 11/22/20.   SI seemed to come on gradually.  A lot of stress lately.  Some voices lately.   They thought the ketamine was causing hallucinations and it was changed without full seizue and then switched to something else and then back to ketamine.   Depressed but not suicidal and no where near suicidal.   Doesn't remember any effect of switch to Caplyta.   Plan: Reduce Loxapine  With goal of going on Caplyta alone.  Caplyta might work better for depression without the loxapine. Reduce loxapine to 5 capsules daily for 2 weeks,  Then reduce to 4 capsules daily for 2 weeks,  Then reduce to 3 capsules daily. Disc risk  increased AH.  02/17/2021 appointment with the following noted: BF David on the session Got psychotic off loxapine and had to raise  dose to 300 mg HS.  No hospitalization. Stopped Caplyta 3 days ago.  Has had excessive sleepiness.   Get lightheaded and dizzy and feels exhausted.  BF says she's collapsed at times. Some of the time she feels anxious but not all the time. Better mood in [place] last couple of weeks.  Has had a lot of depression in the last few weeks before the trip. Still some tiredness.   CO stiff arms walking and hurts to walk. Likes loxapine with control voices generally. SE overall been OK until lately some neck and back pain ? Related.    06/20/21 appt noted:  seen with H and M Really depressed, anxious and psychotic.  Voices worse when depressed and anxious. ECT every 2-3 weeks since march.   Voices of people and God tell her to hurt herself and I don't want to.  Trying to act like a normal person.  2 weeks ago stopped  amantadine bc SE and not hlelping that much. Missing work a lot lately with anxiety and depression and pain in arms and legs she thinks related to stopping amantadine.  Passing out also more the past month per Laura Mathews but Jerilynn Mages thinks it has been longer. M concerns frequency of ECT and memory, pain episodes that come and go maybe related to meds, and needs med change. BF notices generally worse over the last month but sees it as cyclical.  0000000 appt noted: Had ECT last 2 weeks. On Vraylar 1.5 mg daily and will increase to 3 mg next week. Some stiffness in neck and arms. Less depressed than she was.  Voices are a lot better so far but not gone completely. Plan: continue sertraline to 1.5 tablets daily  Vraylar increase to 3 mg daily Reduce loxapine to 4 capsules daily.  07/23/2021 appt noted: Reduced loxapine to 150 mg daily and Vraylar 3 mg daily. At first neck pain but now it is better.   Mood has been good.  Getting more things done like cleaning and  cooking. Also had increased treatments of ECT. Next sched is 10 days from now. Still some stiffness and posturing arms R over left. Voices are ok unless really stressed out and there's noise at the same time. Sleep is harder falling asleep.  Needing some Seroquel and occ Klonopin.   Memory is still bad. Not much dizziness or falling.   Plan: continue sertraline to 1.5 tablets daily  Vraylar 3 mg daily Reduce loxapine to 2 of the 50 mg  capsules daily for 1 week, then 1 daily for a week then stop it..  07/29/2021 phone call complaining of insomnia with changed to [place]. Pt left a message that she is having a hard time falling asleep. She is taking trazadone,2 bendryl 4 25 mg seroquel and 2 klonopin to fall asleep. Please call her and let her know wht she can do. She doesn;t want to take this much meds to fall asleep.  MD response: Vraylar commonly causes insomnia when he first started.  When she gets this past 2 or 3 weeks more the problem will go away.  She can take the meds she is currently taking for sleep until the problem resolves.  The insomnia from Arman Filter will go away.  She just needs more time.  It is a very good medicine I saw the patient today who had the same problem but now the insomnia has resolved.  08/15/2021 phone call: Complaining of insomnia and having to take Seroquel but then having urinary  retention on Seroquel.  Has been off loxapine for about a week and was taking trazodone 100 mg along with Seroquel 100 mg for sleep. She was instructed to dramatically reduce the Seroquel to about 25 mg to 50 mg in order to avoid urinary retention and increase the trazodone to 150 mg at night until her appointment.  Started Vraylar at the last appointment in order to try to get better antidepressant effect  08/21/2021 appointment with the following noted: Taking Klonopin about 0.75 mg prn GI px with pain and vomiting for 16 days. No diarrhea. GI doctor Monday A lot happier on the Vraylar 3  mg daily. Not really re: voices. Seroquel 50-100 mg HS ok with urinary px but took 7 of the Seroquel and had U retention. Taking Zofran and Carafate. Increase trazodone to 150 without much help.   Last night 8 hours but usually sleeps 11 hours.   Psychiatric medication history includes risperidone 3.5 mg EPS,  Abilify 30 mg withdrawal dyskinesia, perphenazine, Geodon, olanzapine, Saphris 5 mg, Haldol, Latuda 160, loxapine 300 mg, Seroquel constipation and U px,   Caplyta NR lamotrigine, Depakote, lithium, clonazepam,  Fetzima, sertraline, Pristiq, paroxetine,  citalopram, Lexapro,  Cytomel, topiramate, buspirone, gabapentin, trazodone  propranolol with no response for anxiety benztropine ECT maintenance Under our psychiatric care since 2006.  Remote history of OD Klonopin.  Review of Systems:  Review of Systems  Constitutional:  Positive for fatigue. Negative for unexpected weight change.  Cardiovascular:  Negative for chest pain and palpitations.  Gastrointestinal:  Positive for abdominal pain, nausea and vomiting. Negative for diarrhea.  Endocrine: Negative for polyuria.  Genitourinary:  Positive for difficulty urinating. Negative for urgency.  Musculoskeletal:  Positive for back pain, myalgias and neck pain.  Neurological:  Positive for dizziness and tremors. Negative for weakness and headaches.  Psychiatric/Behavioral:  Positive for decreased concentration. Negative for agitation, behavioral problems, confusion, dysphoric mood, hallucinations, self-injury, sleep disturbance and suicidal ideas. The patient is not nervous/anxious and is not hyperactive.    Medications: I have reviewed the patient's current medications.  Current Outpatient Medications  Medication Sig Dispense Refill   aMILoride (MIDAMOR) 5 MG tablet Take 5 mg by mouth daily.     b complex vitamins tablet Take 1 tablet by mouth daily.     cariprazine (VRAYLAR) 3 MG capsule Take 1 capsule (3 mg total) by mouth  daily. 30 capsule 0   Cholecalciferol (VITAMIN D3) 5000 units CAPS Take 1 capsule by mouth daily.     clonazePAM (KLONOPIN) 0.5 MG tablet TAKE 1 TABLET BY MOUTH TWICE A DAY AND TAKE 2 TABLETS AT BEDTIME AS NEEDED 60 tablet 0   lamoTRIgine (LAMICTAL) 200 MG tablet TAKE 1 TABLET DAILY 90 tablet 1   liothyronine (CYTOMEL) 25 MCG tablet TAKE 1 AND 1/2 TABLETS BY MOUTH EVERY MORNING 135 tablet 0   lithium carbonate (LITHOBID) 300 MG CR tablet TAKE 1 AND 1/2 TABLETS IN  THE MORNING AND 2 TABLETS  IN THE EVENING 315 tablet 0   loxapine (LOXITANE) 50 MG capsule TAKE 6 CAPSULES AT BEDTIME (Patient taking differently: Off for 10 days) 540 capsule 1   Melatonin 10 MG CAPS Take 10 mg by mouth.     Omega-3 Fatty Acids (FISH OIL) 1200 MG CAPS Take by mouth.     QUEtiapine (SEROQUEL) 50 MG tablet TAKE 1 TO 2 TABLETS AT NIGHT FOR SLEEP (Patient taking differently: Take 50 mg by mouth at bedtime. TAKE 1 TO 2 TABLETS AT NIGHT FOR SLEEP)  20 tablet 0   sertraline (ZOLOFT) 100 MG tablet TAKE 2 TABLETS DAILY (Patient taking differently: 1.5 tabs) 180 tablet 1   traZODone (DESYREL) 100 MG tablet TAKE 1 TABLET AT BEDTIME (Patient taking differently: 150 mg.) 90 tablet 0   No current facility-administered medications for this visit.    Medication Side Effects: Other: tremor , grinding teeth daytime, blurred vision, mild right arm posturing, sleep 11 hours.  Not much daytime sleepiness. Stimulating SE Wellbutrin  Allergies:  Allergies  Allergen Reactions   Metformin And Related    Naltrexone     History reviewed. No pertinent past medical history.  History reviewed. No pertinent family history.  Social History   Socioeconomic History   Marital status: Significant Other    Spouse name: Not on file   Number of children: Not on file   Years of education: Not on file   Highest education level: Not on file  Occupational History   Not on file  Tobacco Use   Smoking status: Never   Smokeless tobacco: Never   Substance and Sexual Activity   Alcohol use: Not on file   Drug use: Not on file   Sexual activity: Not on file  Other Topics Concern   Not on file  Social History Narrative   Not on file   Social Determinants of Health   Financial Resource Strain: Not on file  Food Insecurity: Not on file  Transportation Needs: Not on file  Physical Activity: Not on file  Stress: Not on file  Social Connections: Not on file  Intimate Partner Violence: Not on file    Past Medical History, Surgical history, Social history, and Family history were reviewed and updated as appropriate.   Please see review of systems for further details on the patient's review from today.   Objective:   Physical Exam:  There were no vitals taken for this visit.  Physical Exam Neurological:     Mental Status: She is alert and oriented to person, place, and time.     Cranial Nerves: No dysarthria.  Psychiatric:        Attention and Perception: Attention normal. She perceives auditory hallucinations.        Mood and Affect: Mood is depressed. Mood is not anxious.        Speech: Speech normal.        Behavior: Behavior is not slowed. Behavior is cooperative.        Thought Content: Thought content normal. Thought content is not paranoid or delusional. Thought content does not include homicidal or suicidal ideation. Thought content does not include suicidal plan.        Cognition and Memory: Cognition and memory normal.        Judgment: Judgment normal.     Comments: Insight intact Less hypoverbal  AH with stress but not worse    Lab Review:     Component Value Date/Time   NA 138 09/28/2019 0954   K 4.2 09/28/2019 0954   CL 103 09/28/2019 0954   CO2 23 09/28/2019 0954   GLUCOSE 93 09/28/2019 0954   GLUCOSE 99 10/10/2010 1150   BUN 16 09/28/2019 0954   CREATININE 0.81 09/28/2019 0954   CALCIUM 10.3 (H) 09/28/2019 0954   PROT 7.5 10/10/2010 0207   ALBUMIN 4.1 10/10/2010 0207   AST 23 10/10/2010 0207    ALT 19 10/10/2010 0207   ALKPHOS 40 10/10/2010 0207   BILITOT 0.4 10/10/2010 0207   GFRNONAA 94 09/28/2019 0954  GFRAA 108 09/28/2019 0954       Component Value Date/Time   WBC 16.7 (H) 10/10/2010 0207   RBC 4.50 10/10/2010 0207   HGB 12.7 04/14/2011 0956   HCT 40.0 10/10/2010 0223   PLT 225 10/10/2010 0207   MCV 86.7 10/10/2010 0207   MCH 28.7 10/10/2010 0207   MCHC 33.1 10/10/2010 0207   RDW 12.9 10/10/2010 0207   LYMPHSABS 3.3 10/10/2010 0207   MONOABS 1.7 (H) 10/10/2010 0207   EOSABS 0.4 10/10/2010 0207   BASOSABS 0.1 10/10/2010 0207    Lithium Lvl  Date Value Ref Range Status  07/24/2021 1.1 0.5 - 1.2 mmol/L Final    Comment:    A concentration of 0.5-0.8 mmol/L is advised for long-term use; concentrations of up to 1.2 mmol/L may be necessary during acute treatment.                                  Detection Limit = 0.1                           <0.1 indicates None Detected     TSH low on Cytomel in June 2020 No results found for: PHENYTOIN, PHENOBARB, VALPROATE, CBMZ  l  Component 03/26/21 10/30/20 07/17/20 04/17/20 03/06/20 02/13/20  Lithium Lvl 1.1 1.1 1.1 1.2 1.2 1.4 High    04/23/21 Cr 0.74  .res Assessment: Plan:    Marques was seen today for follow-up, depression, anxiety, medication problem and sleeping problem.  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  Tarini is chronically unstable and prone to extrapyramidal side effects complicating treatment. Very complicated patient on polypharmacy and  receiving ECT.  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  Patient's mother would really like her off ECT because of concern it may be affecting memory.  Also there is concern because her depression is getting worse and none of them want to increase the frequency of ECT to deal with it.  There has been some increased ECT lately but not sufficient response. Ongoing ECT in [facility]  Discussed the possibility of clozapine again because whenever she is more depressed she hears voices more.  The voices can tell her to hurt herself but she commits to not doing so.Extensive discussion of clozapine in detail.   Including the risk of severe neutropenia, marked weight gain, sedation, metabolic problems, cardiac risk, etc.  Discussed the need for weekly blood test for at least 6 months.  However we also discussed this is the most effective antipsychotic on the market.  It matter may better control her voices.  She wishes to defer.  EPS problems: amantadine 100 mg BID  She is not significantly depressed at this time but has been lately.  She continues maintenance ECT  Started Vraylar 3 mg at the last appointment to  try to get better antidepressant effect and weaned her off the loxapine.  Not surprisingly she has had problems with insomnia.  She took Seroquel 100 mg with trazodone 100 mg and then had urinary retention problems when taking high doses (350 mg ) Seroquel. So far depression is improved on the Vraylar.  She is noticeably happier.  The last 10 days or so she has had GI problems of nausea and abdominal pain and some vomiting.  She has been to the ER.  She has a GI appointment upcoming.  We discussed the possibility that this could be related to coming off of loxapine and switched to  Vraylar although it is uncertain.  If it is related it should improve with time.  For anxiety consider low dose Luvox which has less risk of mood cycling than other SSRIs usually and helps anxiety.  salt tablets to each meal to stop orthostasis  Most effective option clozapine.    continue sertraline to 1.5 tablets daily  Vraylar 3 mg daily Continue Seroquel at low dose for severe insomnia 50 to 100 mg which is below the dose that is caused urinary retention. She can try increasing trazodone to 150 or 200 mg to see if sleep will improve.  Sleep is expected to improve with time off the loxapine.  If voices get worse call.    Her low vitamin D level is supplemented and resolved with supplemental vitamin D.  Low Vitamin D increases the risk of depression  Option Ozempic or Saxenda for weight loss.  Disc in detail in previous visit. Disc  Counseled patient regarding potential benefits, risks, and side effects of lithium to include potential risk of lithium affecting thyroid and renal function.  Discussed need for periodic lab monitoring to determine drug level and to assess for potential adverse effects.  Counseled patient regarding signs and symptoms of lithium toxicity and advised that they notify office immediately or seek urgent medical attention if experiencing these signs and symptoms.  Patient advised to contact office with any questions or concerns. Being followed by nephro and labs inlcuding lithium levels noted above.   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 No indication to change bc lithium helps her stability so much.  Will continue to coordinate with nephrology; Check lithium level.  Follow-up 4 weeks  Lynder Parents, MD, DFAPA  Please see After Visit Summary for patient specific instructions.  Future Appointments  Date Time Provider [place]  09/25/2021  4:00 PM Cottle, Billey Co., MD [facility] None      No orders of the defined types were  placed in this encounter.      -------------------------------

## 2021-08-26 ENCOUNTER — Telehealth: Payer: Self-pay | Admitting: Psychiatry

## 2021-08-26 NOTE — Telephone Encounter (Signed)
Lvm to rtc °

## 2021-08-26 NOTE — Telephone Encounter (Signed)
Fronnie called to ask if she could decrease her Zoloft from 1.5 tabs to just 1 tab.  Appt 3/9.  Please call to discuss.

## 2021-08-27 NOTE — Telephone Encounter (Signed)
Pt stated she wants to reduce dose and eventually come off the med because she does not see benefit from it

## 2021-08-27 NOTE — Telephone Encounter (Signed)
Yes she can reduce sertraline to 1 tablet daily.

## 2021-08-28 ENCOUNTER — Telehealth: Payer: Self-pay | Admitting: Psychiatry

## 2021-08-28 ENCOUNTER — Other Ambulatory Visit: Payer: Self-pay

## 2021-08-28 MED ORDER — CARIPRAZINE HCL 3 MG PO CAPS: 3.0000 mg | ORAL_CAPSULE | Freq: Every day | ORAL | 0 refills | Status: DC

## 2021-08-28 NOTE — Telephone Encounter (Signed)
Pt LVM asking for refill of Vraylar 3mg  to  [id number] , [place] - [address] AT [address]  [address] [place] [facility] [place]  Phone:  [phone]  Fax:  [phone]   Next appt 3/9

## 2021-08-28 NOTE — Telephone Encounter (Signed)
Rx sent 

## 2021-08-28 NOTE — Telephone Encounter (Signed)
Pt informed

## 2021-09-03 ENCOUNTER — Telehealth: Payer: Self-pay | Admitting: Psychiatry

## 2021-09-03 NOTE — Telephone Encounter (Signed)
See phone message. Called patient to get more details. She said her depression has increased the last few weeks. I asked if there were new stressors or triggers and she said she had been sick for about 3 weeks and had been mostly homebound. She said she also decreased Zoloft about a week ago. She has been throwing up some of her pills, but said today that she had not. She said she had an endoscopy recently and was told that her medications were damaging her stomach. I asked if they told her which ones and she said they didn't say and she hasn't had a F/U appt yet. She is sleeping well. She admits to SI, but has no plan. She is aware of available resources in [facility] should she need them. She does have a support system in place.   She was slow to start a conversation and then her voice was shaky and tearful.  Speech is a little halting, not free flowing.

## 2021-09-03 NOTE — Telephone Encounter (Signed)
She of course could be feeling worse because of the illness and missing some of her medications because of vomiting.  There is not much I can do about that problem.  Hopefully her vomiting will stop.  A concern however is that if she continues to take lithium but cannot keep fluids down well she could become lithium toxic which week will make her confused.  If she continues to have vomiting she needs to reduce the lithium by 1 capsule or tablet daily until the vomiting resolves.  Make sure she is drinking plenty of fluids.    The thing that would most help her depression and suicidal thoughts is to contact her ECT doctor and see if they could do another administration of ECT sooner rather than later.  She has been getting maintenance ECT for depression for 2 or 3 years.  The voices are coming most likely because of missing some of the medicine.  They should resolve as her depression resolves with the ECT and she gets back on regular medication.

## 2021-09-03 NOTE — Telephone Encounter (Signed)
Called patient and provided the recommendations given.

## 2021-09-03 NOTE — Telephone Encounter (Signed)
Pt LVM that she is feeling more depressed for the most of last week and this week. She feels like it is getting worse. She has not  been able to keep her meds down  due to  stomach upset. She said today was better and she was able to keep her meds down. However, she is confused and hears people talking to her . Please call her at [phone]

## 2021-09-04 ENCOUNTER — Other Ambulatory Visit: Payer: Self-pay | Admitting: Psychiatry

## 2021-09-04 ENCOUNTER — Telehealth: Payer: Self-pay | Admitting: Psychiatry

## 2021-09-04 MED ORDER — SERTRALINE HCL 100 MG PO TABS: 150.0000 mg | ORAL_TABLET | Freq: Every day | ORAL | 0 refills | Status: DC

## 2021-09-04 NOTE — Telephone Encounter (Unsigned)
RTC Admin note: Pt called reporting she is still feeling very very depressed. Able to keep meds down yesterday and today. Her mom does not want  her to have any ECT. Pt asking if CC can change her meds instead. She also, stated she has taken a lot of seroquel  & clonazepam to sleep thru this feeling.Benay Pillow # [phone]  LPN note: Rtc to pt and she reports having increased depression for 3 days, she took approximately 10 Seroquel 50 mg and 5 Clonazepam 0.5 mg about 2-3 hours prior. Her speech is slurred but making sense. She denies trying to kill herself she wants to sleep to not have all these bad thoughts. She has been sick but today and yesterday she was able to keep her medication down. Her partner is at home with her today he worked from home. Informed her I would inform Dr. Jennelle Human. Her Mom also doesn't want her to have anymore ECT.    TC MD:  To pt and BF David: Taking 50 mg loxapine. Taking Vraylar 3 mg daily.  She increased sertraline 150 mg daily. Seeing GI doctor and dx gastritis.

## 2021-09-04 NOTE — Telephone Encounter (Signed)
Rtc to pt and she reports having increased depression for 3 days, she took approximately 10 Seroquel 50 mg and 5 Clonazepam 0.5 mg about 2-3 hours prior. Her speech is slurred but making sense. She denies trying to kill herself she wants to sleep to not have all these bad thoughts. She has been sick but today and yesterday she was able to keep her medication down. Her partner is at home with her today he worked from home. Informed her I would inform Dr. Jennelle Human. Her Mom also doesn't want her to have anymore ECT.   Dr. Jennelle Human aware and will contact pt.

## 2021-09-04 NOTE — Telephone Encounter (Signed)
Pt called reporting she is still feeling very very depressed. Able to keep meds down yesterday and today. Her mom does not want  her to have any ECT. Pt asking if CC can change her meds instead. She also, stated she has taken a lot of seroquel  & clonazepam to sleep thru this feeling.Chiropodist # [phone]

## 2021-09-07 ENCOUNTER — Telehealth: Payer: Self-pay | Admitting: Physician Assistant

## 2021-09-07 NOTE — Telephone Encounter (Signed)
Received call from pt, says she need something to help calm her down. Has been more depressed and anxious for about 4-5 days. Sleeping a lot. Hearing voices daily, telling her 'dark things' but she doesn't go into detail.  She increased Zoloft back up to 150 mg when she started feeling more depressed 4 days ago. Denies SI/HI.  It's causing a lot of anxiety, has taken Klonopin 1.5 mg this morning and still feels very anxious. Denies manic sx.  All meds reviewed. Recommend taking Seroquel 100 mg now and again at usual time before bed. Cont Klonopin as directed. Go to [facility] if becomes suicidal.

## 2021-09-08 NOTE — Telephone Encounter (Signed)
I spoke with the patient myself on Friday, 09/05/2021.  Also spoke with her boyfriend [name].  I repeated the earlier information that because of her nausea and vomiting problem she has been missing some of the medication because she has been vomiting the medicine.  She had not vomited for 2 days as of the conversation Friday.  She was encouraged to continue the medication.  She was also encouraged to contact the ECT doctors and arrange for earlier ECT.  She has been on maintenance ECT for probably 3 years and I know we are trying to get away from ECT by the use of Vraylar but she needs to have an earlier maintenance ECT.  Have her contact the ECT team at [facility] and move her treatment earlier.  That is the thing that we will get if her more prompt relief.  Prior to the development of the vomiting problem she was getting benefit from Vraylar so I do not want to change this medicine.

## 2021-09-09 ENCOUNTER — Telehealth: Payer: Self-pay | Admitting: Psychiatry

## 2021-09-09 MED ORDER — AMANTADINE HCL 100 MG PO CAPS: 100.0000 mg | ORAL_CAPSULE | Freq: Two times a day (BID) | ORAL | 0 refills | Status: DC

## 2021-09-09 NOTE — Telephone Encounter (Signed)
Pt requesting  new Rx for Amantadine 100 mg 2[facility]d. [facility] [address] [place], [place]

## 2021-09-09 NOTE — Telephone Encounter (Signed)
Pended to Dr. Cottle 

## 2021-09-17 NOTE — Telephone Encounter (Signed)
I called pharmacy and they stated pt did pick up rx on 2/21.I called pt and she said she does not remember picking it up and if she did she does not have it and wants a new rx if possible

## 2021-09-17 NOTE — Telephone Encounter (Signed)
Looks like Dr. Jennelle Human sent the script on 2/21, but pt called today and said the pharmacy is not showing it.  Pls call them to see what the problem is and then call pt back to let her know it is resolved. ? ? ?

## 2021-09-18 NOTE — Telephone Encounter (Signed)
Ask her what sx she has that make her want to take the amantadine.  She was given this for stiffness when on hgih dose loxapine and her loxapine dose is lower. ?

## 2021-09-18 NOTE — Telephone Encounter (Signed)
If the amantadine helps these sx then take it .  If not then don't take it. ?

## 2021-09-18 NOTE — Telephone Encounter (Signed)
She stated her neck is stiff and her arm is stiff and wont bend when standing up.She stated if you don't want her to take it she can stop

## 2021-09-19 NOTE — Telephone Encounter (Signed)
Pt informed

## 2021-09-25 ENCOUNTER — Telehealth (INDEPENDENT_AMBULATORY_CARE_PROVIDER_SITE_OTHER): Payer: Medicare Other | Admitting: Psychiatry

## 2021-09-25 ENCOUNTER — Encounter: Payer: Self-pay | Admitting: Psychiatry

## 2021-09-25 DIAGNOSIS — F4001 Agoraphobia with panic disorder: Secondary | ICD-10-CM

## 2021-09-25 DIAGNOSIS — F411 Generalized anxiety disorder: Secondary | ICD-10-CM

## 2021-09-25 DIAGNOSIS — Z79899 Other long term (current) drug therapy: Secondary | ICD-10-CM

## 2021-09-25 DIAGNOSIS — F5105 Insomnia due to other mental disorder: Secondary | ICD-10-CM

## 2021-09-25 DIAGNOSIS — N251 Nephrogenic diabetes insipidus: Secondary | ICD-10-CM

## 2021-09-25 DIAGNOSIS — F250 Schizoaffective disorder, bipolar type: Secondary | ICD-10-CM | POA: Diagnosis not present

## 2021-09-25 DIAGNOSIS — G251 Drug-induced tremor: Secondary | ICD-10-CM

## 2021-09-25 DIAGNOSIS — T50905A Adverse effect of unspecified drugs, medicaments and biological substances, initial encounter: Secondary | ICD-10-CM

## 2021-09-25 DIAGNOSIS — G2589 Other specified extrapyramidal and movement disorders: Secondary | ICD-10-CM | POA: Diagnosis not present

## 2021-09-25 MED ORDER — PRAMIPEXOLE DIHYDROCHLORIDE 0.25 MG PO TABS: ORAL_TABLET | ORAL | 1 refills | Status: AC

## 2021-09-25 MED ORDER — CARIPRAZINE HCL 1.5 MG PO CAPS: 1.5000 mg | ORAL_CAPSULE | Freq: Every day | ORAL | 1 refills | Status: AC

## 2021-09-25 NOTE — Patient Instructions (Addendum)
Stop loxapine  ?Stop amantadine ?Start pramipexole 1/2 tablet for 5-7 days then 1 tablet twice daily if needed. ?Reduce Vraylar to 1.5 mg daily ? ? ?

## 2021-09-25 NOTE — Progress Notes (Signed)
Laura Mathews [id number] 20-Jun-1981 41 y.o.   Video Visit via My Chart  I connected with pt by My Chart and verified that I am speaking with the correct person using two identifiers.   I discussed the limitations, risks, security and privacy concerns of performing an evaluation and management service by My Chart  and the availability of in person appointments. I also discussed with the patient that there may be a patient responsible charge related to this service. The patient expressed understanding and agreed to proceed.  I discussed the assessment and treatment plan with the patient. The patient was provided an opportunity to ask questions and all were answered. The patient agreed with the plan and demonstrated an understanding of the instructions.   The patient was advised to call back or seek an in-person evaluation if the symptoms worsen or if the condition fails to improve as anticipated.  I provided 50 minutes of video time during this encounter.  The patient was located at home and the provider was located office. Session from 4 until 4:50 PM  Subjective:   Patient ID:  Laura Mathews is a 41 y.o. (DOB 1981/02/15) female.  Chief Complaint:  Chief Complaint  Patient presents with   Follow-up    Schizoaffective disorder, bipolar type [facility])   Depression   Anxiety   Hallucinations   Sleeping Problem      Laura Mathews presents  today for follow-up of schizoaffective depression with hallucinations and anxiety.  When seen August 2020.  We reduced the Cytomel to 37.5 mcg bc of suppressed TSH.  She's noticed no problems.  At visit September 19, 2018.  At that visit we reduced Wellbutrin XL 300 to 150 because of anxiety and recently high blood pressure.    At her visit in April 2020.  She reported no problems from reducing the Wellbutrin and that her anxiety was somewhat better.  There were no changes made at the visit in April.  At visit October 2020.   No meds were changed.  Have gotten notice about [facility] not having access to loxapine which is a significant risk for this patient. She will try to access it.  Overall Loxapine has been very good to me but some movement disorder issues with toes moving, teeth grinding, Arm stiffness R, swallow involuntary movements.  seen May 25, 2019.  She remained on loxapine 300 mg.  Under stress she was still having hallucinations and she was given olanzapine 10 mg sublingual as needed auditory hallucinations.  She was continued on Wellbutrin XL 150 mg.  She was continued on Cytomel 37.5 mg every morning.  Also continued lithium 1200 mg daily and lamotrigine 200 mg daily. There was concern about the loxapine shortage and that she might have to be switched to another medication hopefully with low EPS risk such as Fanapt.  But she has a history of orthostatic hypotension and it is unclear if she could tolerate that medication.  seen July 28, 2019.  Because of the shortage of loxapine we had to wean and discontinue that medication.  She was given Caplyta as an alternative antipsychotic given multiple other failures and her tendency to be very sensitive to EPS and tardive dyskinesia. Patient took it for about 3 weeks and then called February 1 stating that she was having nausea and involuntary facial movements and had found loxapine and wanted to return to it.  Therefore Caplyta was stopped and she was restarted on loxapine to quickly titrate back up  to 300 mg nightly the same dose that she is taken in the past.  seen FEB 8 seen with her mother.  Ran out of Caplyta for 3 days bc pharmacy didn't have it.  Just back to 300 mg loxapine as of last night.  Slept fine on Caplyta with less duration needed. Mood pretty stable and not markedly depressed.  Today feels a ltttle psychotic with voices and feeling people are inside her a little confusion.  Movements from mouth have seemed better.  Has felt a little tight throat that  scared her.  Mo notes facial grimacing and lip pursing has almost totally gone.   Mood really well overall with ECT reduced to once monthly. Reports getting "modified bilateral"  ECT.   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 Twin sister had baby boy .  Laura Mathews is doing well.  Applying for better job.  Some are in other states.  Not worrying about it.  Together for 13 years.    As of October 02, 2019, Really well overall.   Not manic and "not too psychotic".  Voices down to mostly noise.   Patient reports stable mood and denies depressed or irritable moods.  Anxiety is better and using clonazepam or Seroquel.    Walking more has helped and less depression improved motivation. Patient denies difficulty with sleep initiation or maintenance. Denies appetite disturbance.  Patient reports that energy and motivation have been good.  Patient denies any difficulty with concentration.  Patient denies any suicidal ideation.  No recent panic.   Quiet noise. No med changes.  11/29/19 appt the following noted: Needs trazodone and Seroquel to sleep.  Rare olanzapine for AH.  Still trouble falling asleep with meds and can lay in bed for hours.  Will sleep late to make up for it.   Otherwise well without mania nor depression.  Occ psychotic sx with stressors.  Nephro will leave her on lithium and start amiloride for Nephrogenic Diabetes Insipidus.  Has started and will fu with labs in 2 weeks.Sx polydipsia and polyuria and thirst.  Couple of accidents and 1 in public.  Said renal function was good otherwise.  No SI in a long time.   Tutoring 3 hours weekly.  Enjoys it.   Laura Mathews is not planning a job change at this time.  Will sign lease for apt for another year. Plan: no med changes  05/07/2020 appointment with the following noted: Continued all meds.  Still doing maintenance ECT monthly. No olanzapine lately.  Only twice since had  it.   For the most part don't hear voices unless under severe stress. Mood happy without depression. Doing deep breathing from DBT to help with anxiety.  I'm still an anxious person. 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   CO weight gain since here and thinks it's from using the Seroquel 50-100 mg  for sleep and now trying to use less. Sleep well with Seroquel and Klonopin.  SE EPS bc walking with arms bent and this is leading to some pain R >L.  Can straighten it out.  08/06/2020 appt with following noted: No olanzapine used in months. Well overall.  Mood pretty balanced now. No mania or depression now. Last had depression after severe UTI. Not sure when but anxiety worse in the am and hard to get off the couch until early afternoon.  No specific thoughts  or fears associated.   Still going to [employer] to meet friends. Not much caffeine 1 cup am.   Sleep really well.   Started Noom and lost 7 # so far.   Voices only problem when stressed.   Plan: Hold  Wellbutrin 150 for 1 week to see if anxiety is better .    10/02/2020 phone call from patient complaining of worsening depression with suicidal thoughts without intent. Nurse note:Rtc to pt and she reports having increased depression for about 1 week now, she had ECT on Friday 3/11 and will have ECT again this Friday the 18th. She has been waking up earlier then usual and unable to fall asleep, about an 1-1/2 hours early.  Asking if there is anything she can do to help? Suicidal thoughts but nothing specific or any intent MD response:Lithium level checked on 09/27/2020 was 1.0.  Therefore no room to increase that dosage. On sertraline 200 mg daily so no room to increase that dosage. At the last appointment, I asked her to hold the Wellbutrin for a week to see if her anxiety was better.  I do not know if she stopped it or restarted it.  If she stopped it have her restart the Wellbutrin XL 150 mg 1 AM.  If she is still taking it  have her increase the Wellbutrin XL to 1-1/2 tablets every morning.  The bottle will say not to cut the tablets but because her dosage is low it is okay to cut the tablets if needed. Nurse contact with patient:Rtc to pt and she reports she did stop it but restarted it after 2 weeks, advised her to increase Wellbutrin XL to 1.5 tablets of 150 mg tablet. She will call back with worsening symptoms  10/22/2020 appointment with the following noted: Doing a lot better with depression.  Don't feel depressed anymore.  Had extra ECT 2 per week for 3 weeks. Ketamine used and triggered hallucinations for a couple of weeks which were visual shapes and not scary.   Feels like heart is beating too fast and hard to relax and harder to sleep. While off Wellbutrin anxiety feelings got better. Asks if other treatment options exist for depression besides ECT. Mom on session says her memory is Terrible DT ECT. Getting modified bilateral ECT. Plan: In hopes of getting better control of depression with the eventual goal of stopping ECT we will make the following change, DC Wellbutrin  And start Caplyta 42 mg daily. .  11/27/2020 appointment with the following noted:  Seen with mother Patient hospitalized 11/20/2020 [facility] health care for suicidal ideation and depression. ECT 4/29.  Then dep with SI 5/3 and went to ER.  Had ECT and felt better on 11/22/20.   SI seemed to come on gradually.  A lot of stress lately.  Some voices lately.   They thought the ketamine was causing hallucinations and it was changed without full seizue and then switched to something else and then back to ketamine.   Depressed but not suicidal and no where near suicidal.   Doesn't remember any effect of switch to Caplyta.   Plan: Reduce Loxapine  With goal of going on Caplyta alone.  Caplyta might work better for depression without the loxapine. Reduce loxapine to 5 capsules daily for 2 weeks,  Then reduce to 4 capsules daily for 2 weeks,  Then reduce  to 3 capsules daily. Disc risk increased AH.  02/17/2021 appointment with the following noted: BF David on the session Got  psychotic off loxapine and had to raise dose to 300 mg HS.  No hospitalization. Stopped Caplyta 3 days ago.  Has had excessive sleepiness.   Get lightheaded and dizzy and feels exhausted.  BF says she's collapsed at times. Some of the time she feels anxious but not all the time. Better mood in [place] last couple of weeks.  Has had a lot of depression in the last few weeks before the trip. Still some tiredness.   CO stiff arms walking and hurts to walk. Likes loxapine with control voices generally. SE overall been OK until lately some neck and back pain ? Related.    06/20/21 appt noted:  seen with H and M Really depressed, anxious and psychotic.  Voices worse when depressed and anxious. ECT every 2-3 weeks since march.   Voices of people and God tell her to hurt herself and I don't want to.  Trying to act like a normal person.  2 weeks ago stopped  amantadine bc SE and not hlelping that much. Missing work a lot lately with anxiety and depression and pain in arms and legs she thinks related to stopping amantadine.  Passing out also more the past month per Laura Hua but Judie Petit thinks it has been longer. M concerns frequency of ECT and memory, pain episodes that come and go maybe related to meds, and needs med change. BF notices generally worse over the last month but sees it as cyclical.  07/03/21 appt noted: Had ECT last 2 weeks. On Vraylar 1.5 mg daily and will increase to 3 mg next week. Some stiffness in neck and arms. Less depressed than she was.  Voices are a lot better so far but not gone completely. Plan: continue sertraline to 1.5 tablets daily  Vraylar increase to 3 mg daily Reduce loxapine to 4 capsules daily.  07/23/2021 appt noted: Reduced loxapine to 150 mg daily and Vraylar 3 mg daily. At first neck pain but now it is better.   Mood has been good.  Getting more  things done like cleaning and cooking. Also had increased treatments of ECT. Next sched is 10 days from now. Still some stiffness and posturing arms R over left. Voices are ok unless really stressed out and there's noise at the same time. Sleep is harder falling asleep.  Needing some Seroquel and occ Klonopin.   Memory is still bad. Not much dizziness or falling.   Plan: continue sertraline to 1.5 tablets daily  Vraylar 3 mg daily Reduce loxapine to 2 of the 50 mg  capsules daily for 1 week, then 1 daily for a week then stop it..  07/29/2021 phone call complaining of insomnia with changed to Vraylar. Pt left a message that she is having a hard time falling asleep. She is taking trazadone,2 bendryl 4 25 mg seroquel and 2 klonopin to fall asleep. Please call her and let her know wht she can do. She doesn;t want to take this much meds to fall asleep.  MD response: Vraylar commonly causes insomnia when he first started.  When she gets this past 2 or 3 weeks more the problem will go away.  She can take the meds she is currently taking for sleep until the problem resolves.  The insomnia from Leafy Kindle will go away.  She just needs more time.  It is a very good medicine I saw the patient today who had the same problem but now the insomnia has resolved.  08/15/2021 phone call: Complaining of insomnia and having  to take Seroquel but then having urinary retention on Seroquel.  Has been off loxapine for about a week and was taking trazodone 100 mg along with Seroquel 100 mg for sleep. She was instructed to dramatically reduce the Seroquel to about 25 mg to 50 mg in order to avoid urinary retention and increase the trazodone to 150 mg at night until her appointment.  Started Vraylar at the last appointment in order to try to get better antidepressant effect  08/21/2021 appointment with the following noted: Taking Klonopin about 0.75 mg prn GI px with pain and vomiting for 16 days. No diarrhea. GI doctor Monday A  lot happier on the Vraylar 3 mg daily. Not really re: voices. Seroquel 50-100 mg HS ok with urinary px but took 7 of the Seroquel and had U retention. Taking Zofran and Carafate. Increase trazodone to 150 without much help.   Last night 8 hours but usually sleeps 11 hours. Plan: continue sertraline to 1.5 tablets daily  Vraylar 3 mg daily Continue Seroquel at low dose for severe insomnia 50 to 100 mg which is below the dose that is caused urinary retention. She can try increasing trazodone to 150 or 200 mg to see if sleep will improve.  Sleep is expected to improve with time off the loxapine.  08/26/2021 phone call wanting to reduce sertraline to 100 mg from 150 mg.  Her goal is to eventually stop.  Agreed.  09/03/2021 phone call: Feeling more depressed and feeling that she is getting worse.  Reported some vomiting recently and had seen GI doctors with endoscopy MD response: Recommend move up ECT earlier because it is the thing that is most likely to help her depression more quickly. 09/04/2021 patient called again complaining of depression stating her mother did not want her to have early ECT but had med changes instead. She admitted to taking approximately 10 Seroquel 50 mg tablets and 5 of the clonazepam 0.5 mg tablets 2 to 3 hours prior to calling the office.  It was not a suicide attempt. It is still recommended that she contact the ECT team because of her instability and no med changes can help her as quickly as ECT.  09/09/2021 phone call asking for amantadine refill stating that she was having some stiffness.  09/25/2021 appointment with the following noted: with Laura Hua Last ECT 09/22/2021 On Vraylar 3 mg daily, loxapine 50 mg , lithium ER 300 mg tablets, 1-1/2 every morning and 2 every afternoon, lamotrigine 200 mg daily, clonazepam 0.5 mg 1 to 2 tablets 3 times daily as needed, amantadine 100 mg twice daily, trazodone 100 mg nightly, quetiapine 25 mg as needed insomnia A lot of ECT lately bc  depression has been bad.  Parents said it's not sustainable to get ECT twice weekly.   Not much problem s with voices.  Mainly depression.  Getting up earlier than usual.   Having NV she thinks related to Vraylar. Went back up on sertraline from 100 to 150 when got more depressed. Has had some suicidal thoughts due to the severity of the depression.  Afraid that she will not get better.  States her health care both from our office and the ECT team has been very good and she does not blame anyone else for her suicidal thoughts.  Does agree to the treatment plan  Psychiatric medication history includes risperidone 3.5 mg EPS,  Abilify 30 mg withdrawal dyskinesia, perphenazine, Geodon, olanzapine, Saphris 5 mg, Haldol, Latuda 160, loxapine 300 mg, Seroquel constipation  and U px,   Caplyta NR Vraylar 3 lamotrigine, Depakote, lithium, clonazepam,  Fetzima, sertraline 200, Pristiq, paroxetine,  citalopram, Lexapro,  Cytomel, topiramate, buspirone, gabapentin, trazodone  propranolol with no response for anxiety benztropine ECT maintenance Under our psychiatric care since 2006.  Remote history of OD Klonopin.  Review of Systems:  Review of Systems  Constitutional:  Positive for fatigue.  Cardiovascular:  Negative for chest pain and palpitations.  Gastrointestinal:  Positive for abdominal pain, nausea and vomiting. Negative for diarrhea.  Endocrine: Negative for polyuria.  Genitourinary:  Positive for difficulty urinating. Negative for urgency.  Musculoskeletal:  Positive for back pain, myalgias and neck pain.  Neurological:  Positive for dizziness and tremors. Negative for weakness and headaches.  Psychiatric/Behavioral:  Positive for decreased concentration and dysphoric mood. Negative for agitation, behavioral problems, confusion, hallucinations, self-injury, sleep disturbance and suicidal ideas. The patient is not nervous/anxious and is not hyperactive.    Medications: I have reviewed the  patient's current medications.  Current Outpatient Medications  Medication Sig Dispense Refill   aMILoride (MIDAMOR) 5 MG tablet Take 5 mg by mouth daily.     b complex vitamins tablet Take 1 tablet by mouth daily.     Cholecalciferol (VITAMIN D3) 5000 units CAPS Take 1 capsule by mouth daily.     clonazePAM (KLONOPIN) 0.5 MG tablet TAKE 1 TABLET BY MOUTH TWICE A DAY AND TAKE 2 TABLETS AT BEDTIME AS NEEDED 60 tablet 0   lamoTRIgine (LAMICTAL) 200 MG tablet TAKE 1 TABLET DAILY 90 tablet 1   liothyronine (CYTOMEL) 25 MCG tablet TAKE 1 AND 1/2 TABLETS BY MOUTH EVERY MORNING 135 tablet 0   lithium carbonate (LITHOBID) 300 MG CR tablet TAKE 1 AND 1/2 TABLETS IN  THE MORNING AND 2 TABLETS  IN THE EVENING 315 tablet 0   Melatonin 10 MG CAPS Take 10 mg by mouth.     Omega-3 Fatty Acids (FISH OIL) 1200 MG CAPS Take by mouth.     pramipexole (MIRAPEX) 0.25 MG tablet 1/2 tablet twice daily for 5 days then 1 twice daily 60 tablet 1   QUEtiapine (SEROQUEL) 50 MG tablet TAKE 1 TO 2 TABLETS AT NIGHT FOR SLEEP (Patient taking differently: Take 50 mg by mouth at bedtime. TAKE 1 TO 2 TABLETS AT NIGHT FOR SLEEP) 20 tablet 0   sertraline (ZOLOFT) 100 MG tablet Take 1.5 tablets (150 mg total) by mouth daily. 90 tablet 0   traZODone (DESYREL) 100 MG tablet TAKE 1 TABLET AT BEDTIME (Patient taking differently: 100 mg at bedtime.) 90 tablet 0   cariprazine (VRAYLAR) 1.5 MG capsule Take 1 capsule (1.5 mg total) by mouth daily. 30 capsule 1   No current facility-administered medications for this visit.    Medication Side Effects: Other: tremor , grinding teeth daytime, blurred vision, mild right arm posturing, sleep 11 hours.  Not much daytime sleepiness. Stimulating SE Wellbutrin  Allergies:  Allergies  Allergen Reactions   Metformin And Related    Naltrexone     History reviewed. No pertinent past medical history.  History reviewed. No pertinent family history.  Social History   Socioeconomic History    Marital status: Significant Other    Spouse name: Not on file   Number of children: Not on file   Years of education: Not on file   Highest education level: Not on file  Occupational History   Not on file  Tobacco Use   Smoking status: Never   Smokeless tobacco: Never  Substance and Sexual  Activity   Alcohol use: Not on file   Drug use: Not on file   Sexual activity: Not on file  Other Topics Concern   Not on file  Social History Narrative   Not on file   Social Determinants of Health   Financial Resource Strain: Not on file  Food Insecurity: Not on file  Transportation Needs: Not on file  Physical Activity: Not on file  Stress: Not on file  Social Connections: Not on file  Intimate Partner Violence: Not on file    Past Medical History, Surgical history, Social history, and Family history were reviewed and updated as appropriate.   Please see review of systems for further details on the patient's review from today.   Objective:   Physical Exam:  There were no vitals taken for this visit.  Physical Exam Neurological:     Mental Status: She is alert and oriented to person, place, and time.     Cranial Nerves: No dysarthria.  Psychiatric:        Attention and Perception: Attention normal. She perceives auditory hallucinations.        Mood and Affect: Mood is depressed. Mood is not anxious. Affect is tearful.        Speech: Speech normal.        Behavior: Behavior is not slowed. Behavior is cooperative.        Thought Content: Thought content is not paranoid or delusional. Thought content includes suicidal ideation. Thought content does not include homicidal ideation. Thought content does not include suicidal plan.        Cognition and Memory: Cognition and memory normal.        Judgment: Judgment normal.     Comments: Insight intact Depression worse AH with stress but not worse    Lab Review:     Component Value Date/Time   NA 138 09/28/2019 0954   K 4.2  09/28/2019 0954   CL 103 09/28/2019 0954   CO2 23 09/28/2019 0954   GLUCOSE 93 09/28/2019 0954   GLUCOSE 99 10/10/2010 1150   BUN 16 09/28/2019 0954   CREATININE 0.81 09/28/2019 0954   CALCIUM 10.3 (H) 09/28/2019 0954   PROT 7.5 10/10/2010 0207   ALBUMIN 4.1 10/10/2010 0207   AST 23 10/10/2010 0207   ALT 19 10/10/2010 0207   ALKPHOS 40 10/10/2010 0207   BILITOT 0.4 10/10/2010 0207   GFRNONAA 94 09/28/2019 0954   GFRAA 108 09/28/2019 0954       Component Value Date/Time   WBC 16.7 (H) 10/10/2010 0207   RBC 4.50 10/10/2010 0207   HGB 12.7 04/14/2011 0956   HCT 40.0 10/10/2010 0223   PLT 225 10/10/2010 0207   MCV 86.7 10/10/2010 0207   MCH 28.7 10/10/2010 0207   MCHC 33.1 10/10/2010 0207   RDW 12.9 10/10/2010 0207   LYMPHSABS 3.3 10/10/2010 0207   MONOABS 1.7 (H) 10/10/2010 0207   EOSABS 0.4 10/10/2010 0207   BASOSABS 0.1 10/10/2010 0207    Lithium Lvl  Date Value Ref Range Status  07/24/2021 1.1 0.5 - 1.2 mmol/L Final    Comment:    A concentration of 0.5-0.8 mmol/L is advised for long-term use; concentrations of up to 1.2 mmol/L may be necessary during acute treatment.                                  Detection Limit = 0.1                           <  0.1 indicates None Detected     TSH low on Cytomel in June 2020 No results found for: PHENYTOIN, PHENOBARB, VALPROATE, CBMZ  l  Component 03/26/21 10/30/20 07/17/20 04/17/20 03/06/20 02/13/20  Lithium Lvl 1.1 1.1 1.1 1.2 1.2 1.4 High    04/23/21 Cr 0.74  .res Assessment: Plan:    Penni was seen today for follow-up, depression, anxiety, hallucinations and sleeping problem.  Diagnoses and all orders for this visit:  Schizoaffective disorder, bipolar type (HCC) -     pramipexole (MIRAPEX) 0.25 MG tablet; 1/2 tablet twice daily for 5 days then 1 twice daily -     cariprazine (VRAYLAR) 1.5 MG capsule; Take 1 capsule (1.5 mg total) by mouth daily.  Generalized anxiety disorder  Panic disorder with  agoraphobia  Extrapyramidal movement disorder, drug-induced -     pramipexole (MIRAPEX) 0.25 MG tablet; 1/2 tablet twice daily for 5 days then 1 twice daily  Lithium-induced tremor  Insomnia due to mental condition  Nephrogenic diabetes insipidus (HCC)  Lithium use     Greater than 50% of 50 minutes face to face time with patient was spent on counseling and coordination of care.  Jaylissa is chronically unstable and prone to extrapyramidal side effects complicating treatment. Very complicated patient on polypharmacy and receiving ECT.  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  Frequent calls Dt instability.  Patient's mother would really like her off ECT because of concern it may be affecting memory.  Also there is concern because her depression is getting worse and none of them want to increase the frequency of ECT to deal with it.  There has been some increased ECT lately but not sufficient response. Ongoing ECT in [facility]  Discussed the possibility of clozapine again because whenever she is more depressed she hears voices more.  The voices can tell her to hurt herself but she commits to not doing so.Extensive discussion of clozapine in detail.   Including the risk of severe neutropenia, marked weight gain, sedation, metabolic problems, cardiac risk, etc.  Discussed the need for weekly blood test for at least 6 months.  However we also discussed this is the most effective antipsychotic on the market.  It matter may better control her voices.  She wishes to defer.  DC amantadine not compatible with pramipexole.  She is not significantly depressed at this time but has been lately.  She continues maintenance ECT  Started Vraylar 3 mg january to get better antidepressant effect and reduced loxapine to 50 mg.  Not surprisingly she has had problems with insomnia.  She took  Seroquel 100 mg with trazodone 100 mg and then had urinary retention problems when taking high doses (350 mg ) Seroquel.  more GI problems of nausea and abdominal pain and some vomiting.  She has been to the ER.  She has a GI appointment.   We discussed the possibility that this could be related to Vraylar although it is uncertain.   For anxiety consider low dose Luvox which has less risk of mood cycling than other SSRIs usually and helps anxiety.  salt tablets to each meal to stop orthostasis  Most effective option clozapine.  Off label Auvelity, off label pramipexole, few option remain and rec consultation.  continue sertraline to 1.5 tablets daily  Continue Seroquel at low dose for severe insomnia 50 to 100 mg which is below the dose that is caused urinary retention. She can try increasing  trazodone to 150 or 200 mg to see if sleep will improve.    DC loxapine   Reduce Vraylar DT vomiting to 1.5 mg daily We just discussed the long half life of Vraylar and how she initially was improved on the Vraylar with regard to depression and then seem to lose the benefit and that could be related to the rising blood level due to the long half-life.  DC Amantadine   Pramipexole off label for depression 0.125-0.25 BID Disc SE risk including worsening psychosis   If voices get worse call.    Her low vitamin D level is supplemented and resolved with supplemental vitamin D.  Low Vitamin D increases the risk of depression  Option Ozempic or Saxenda for weight loss.  Disc in detail in previous visit. Disc  Counseled patient regarding potential benefits, risks, and side effects of lithium to include potential risk of lithium affecting thyroid and renal function.  Discussed need for periodic lab monitoring to determine drug level and to assess for potential adverse effects.  Counseled patient regarding signs and symptoms of lithium toxicity and advised that they notify office immediately or seek urgent  medical attention if experiencing these signs and symptoms.  Patient advised to contact office with any questions or concerns. Being followed by nephro and labs inlcuding lithium levels noted above.   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 No indication to change bc lithium helps her stability so much.  Will continue to coordinate with nephrology; Check lithium level.  Discussed safety plan at length with patient.  Advised patient to contact office with any worsening signs and symptoms.  Instructed patient to go to the [facility] emergency room for evaluation if experiencing any acute safety concerns, to include suicidal intent.  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 Stop loxapine  Stop amantadine Start pramipexole 0.25 mg tablet 1/2 tablet for 5-7 days then 1 tablet twice daily if needed. Reduce Vraylar to 1.5 mg daily  Meredith Staggers, MD, DFAPA    Future Appointments  Date Time Provider Department Center  10/23/2021  2:00 PM Cottle, Steva Ready., MD [facility] None      No orders of the defined types were placed in this encounter.      -------------------------------

## 2021-09-29 ENCOUNTER — Encounter: Payer: Self-pay | Admitting: Psychiatry

## 2021-09-30 ENCOUNTER — Telehealth: Payer: Self-pay | Admitting: Psychiatry

## 2021-09-30 NOTE — Telephone Encounter (Signed)
Next appt is 10/23/21. Laura Mathews's mom Laura Mathews called very concerned about her as she hasn't done well in over a year. She wants to discuss her treatment plan and how she is doing. There is not a DPR in her chart allowing [place] to speak to her mother. I spoke to [place] and she is going to supply a fax number to me and I will fax the DPR to her to add her mom. Her mom's number is [phone]. ?

## 2021-09-30 NOTE — Telephone Encounter (Signed)
Will you call mom once we receive DPR please. She is so complex.  ?

## 2021-10-02 NOTE — Telephone Encounter (Signed)
Tomorrow is a very full day for me to be a Friday but I will try to call her tomorrow.  The patient is very complicated and so it is important for me to talk with her ?

## 2021-10-02 NOTE — Telephone Encounter (Signed)
I am not sure we received yet but I believe you are aware of her concerns already. Anything to pass along to Mom? ?

## 2021-10-03 ENCOUNTER — Other Ambulatory Visit: Payer: Self-pay | Admitting: Psychiatry

## 2021-10-03 NOTE — Telephone Encounter (Signed)
Laura Mathews has not faxed the DPR yet with her mom added. She was having an ECT treatment today. She will get it faxed soon as she can.  ?

## 2021-10-06 ENCOUNTER — Telehealth: Payer: Self-pay | Admitting: Psychiatry

## 2021-10-06 ENCOUNTER — Telehealth: Payer: Self-pay | Admitting: Physician Assistant

## 2021-10-06 NOTE — Telephone Encounter (Signed)
I just received a call from the answering service, patient complains of not sleeping for 2 days.  Phone number is [phone]. ?

## 2021-10-06 NOTE — Telephone Encounter (Signed)
Rtc to pt and gave her information. She was very Adult nurse. I asked about what's going on with sleep and she reports she has not been tired at all for 2 days. She will call back if medication not helping. ?

## 2021-10-06 NOTE — Telephone Encounter (Signed)
For insomnia.  Stop trazodone bc NR.  Send in Rx mirtazapine 15 mg HS #15, 1 nightly.  Can combine with 100-200 mg Seroquel but try to stay below the dose of this that causes urinary retention. ?

## 2021-10-06 NOTE — Telephone Encounter (Signed)
Pt called LVM 12:16 reporting she has not slept in 2 days. Contact @ [phone] ?

## 2021-10-06 NOTE — Telephone Encounter (Signed)
Please call patient

## 2021-10-07 ENCOUNTER — Other Ambulatory Visit: Payer: Self-pay | Admitting: Psychiatry

## 2021-10-07 DIAGNOSIS — F5105 Insomnia due to other mental disorder: Secondary | ICD-10-CM

## 2021-10-07 DIAGNOSIS — F250 Schizoaffective disorder, bipolar type: Secondary | ICD-10-CM

## 2021-10-07 MED ORDER — MIRTAZAPINE 15 MG PO TABS: 15.0000 mg | ORAL_TABLET | Freq: Every day | ORAL | 0 refills | Status: DC

## 2021-10-07 NOTE — Telephone Encounter (Signed)
I sent Rx mirtazapine on 3/21.  Call her Friday, 3/24 and see if she is sleeping better and ask if she is having any manic sx. ?

## 2021-10-07 NOTE — Telephone Encounter (Signed)
Pt called to check status on sending in the mirtazepine? She needs it sent in for sleep. Please let her know when its sent. ?TO[id number] [place],  - [address] ?

## 2021-10-08 ENCOUNTER — Other Ambulatory Visit: Payer: Self-pay | Admitting: Psychiatry

## 2021-10-08 DIAGNOSIS — F250 Schizoaffective disorder, bipolar type: Secondary | ICD-10-CM

## 2021-10-08 NOTE — Telephone Encounter (Signed)
Patient notified of mirtazapine refill. Received RF request for Seroquel.  She said she is now using [facility]. Not seeing a Rx being sent to [facility] and patient was unsure if she had received RF. Will call [facility].  ?

## 2021-10-10 ENCOUNTER — Telehealth: Payer: Self-pay | Admitting: Psychiatry

## 2021-10-10 ENCOUNTER — Other Ambulatory Visit: Payer: Self-pay

## 2021-10-10 DIAGNOSIS — F5105 Insomnia due to other mental disorder: Secondary | ICD-10-CM

## 2021-10-10 DIAGNOSIS — F250 Schizoaffective disorder, bipolar type: Secondary | ICD-10-CM

## 2021-10-10 MED ORDER — QUETIAPINE FUMARATE 50 MG PO TABS: ORAL_TABLET | ORAL | 0 refills | Status: DC

## 2021-10-10 NOTE — Telephone Encounter (Signed)
Patient requesting RF on the following medications sent to [facility]. ? ?Lamictal ?Cytomel ?Lithobid ?Zoloft ?Vraylar ?Pramipexole  ?Remeron ? ?Seroquel has already been sent.   ? ?Refills were just sent this week to her local pharmacy.  ?

## 2021-10-10 NOTE — Telephone Encounter (Signed)
Laura Mathews's next visit is 10/14/21. Laura Mathews called and is requesting refills for some of her [facility] [place] and some at [facility]. It looks like these have already been filled. She said she has changed insurance companies. Her phone number is [phone]. ?

## 2021-10-10 NOTE — Telephone Encounter (Signed)
Pt stated she only took the mirtazapine once and is still taking Seroquel.I told her you wanted her to try it consistently so that we know if it's helping or not.She will try it over the weekend and call us back on Monday.She stated she is not having any manic symptoms and the reason why she can't sleep is because she is in pain.

## 2021-10-10 NOTE — Telephone Encounter (Signed)
See previous note.  She was not sleeping earlier and we sent mirtazapine in a couple of days ago.  Call and see if she is sleeping better.  Also ask her if she is having any manic symptoms such as racing thoughts, impulsivity, anger outbursts, hearing voices or anything else along those lines. ?

## 2021-10-13 ENCOUNTER — Other Ambulatory Visit: Payer: Self-pay

## 2021-10-13 DIAGNOSIS — F4001 Agoraphobia with panic disorder: Secondary | ICD-10-CM

## 2021-10-13 DIAGNOSIS — F250 Schizoaffective disorder, bipolar type: Secondary | ICD-10-CM

## 2021-10-13 DIAGNOSIS — T50905A Adverse effect of unspecified drugs, medicaments and biological substances, initial encounter: Secondary | ICD-10-CM

## 2021-10-13 DIAGNOSIS — F411 Generalized anxiety disorder: Secondary | ICD-10-CM

## 2021-10-14 ENCOUNTER — Encounter: Payer: Self-pay | Admitting: Psychiatry

## 2021-10-14 ENCOUNTER — Telehealth (INDEPENDENT_AMBULATORY_CARE_PROVIDER_SITE_OTHER): Payer: Medicare Other | Admitting: Psychiatry

## 2021-10-14 ENCOUNTER — Telehealth: Payer: Self-pay | Admitting: Psychiatry

## 2021-10-14 DIAGNOSIS — F411 Generalized anxiety disorder: Secondary | ICD-10-CM

## 2021-10-14 DIAGNOSIS — G251 Drug-induced tremor: Secondary | ICD-10-CM

## 2021-10-14 DIAGNOSIS — G2589 Other specified extrapyramidal and movement disorders: Secondary | ICD-10-CM

## 2021-10-14 DIAGNOSIS — F250 Schizoaffective disorder, bipolar type: Secondary | ICD-10-CM

## 2021-10-14 DIAGNOSIS — Z79899 Other long term (current) drug therapy: Secondary | ICD-10-CM

## 2021-10-14 DIAGNOSIS — F4001 Agoraphobia with panic disorder: Secondary | ICD-10-CM

## 2021-10-14 DIAGNOSIS — T50905A Adverse effect of unspecified drugs, medicaments and biological substances, initial encounter: Secondary | ICD-10-CM

## 2021-10-14 DIAGNOSIS — F5105 Insomnia due to other mental disorder: Secondary | ICD-10-CM

## 2021-10-14 DIAGNOSIS — E559 Vitamin D deficiency, unspecified: Secondary | ICD-10-CM

## 2021-10-14 NOTE — Telephone Encounter (Signed)
Patient had a MyChart visit with Dr. Clovis Pu today.  ?

## 2021-10-14 NOTE — Telephone Encounter (Signed)
Pt LVM @ 4:27 pm 3/27. Requesting a referral to [facility] for a second opinion. Contact Pt # [phone] Apt [address]28 @ 10:30 ? ?

## 2021-10-14 NOTE — Telephone Encounter (Signed)
See message f rom patient

## 2021-10-14 NOTE — Progress Notes (Signed)
Earnstine Regal Tapscott ?[id number] ?October 05, 1980 ?41 y.o.  ? ?Video Visit via My Chart ? ?I connected with pt by My Chart and verified that I am speaking with the correct person using two identifiers. ?  ?I discussed the limitations, risks, security and privacy concerns of performing an evaluation and management service by My Chart  and the availability of in person appointments. I also discussed with the patient that there may be a patient responsible charge related to this service. The patient expressed understanding and agreed to proceed. ? ?I discussed the assessment and treatment plan with the patient. The patient was provided an opportunity to ask questions and all were answered. The patient agreed with the plan and demonstrated an understanding of the instructions. ?  ?The patient was advised to call back or seek an in-person evaluation if the symptoms worsen or if the condition fails to improve as anticipated. ? ?I provided 45 minutes of video time during this encounter.  The patient was located at home and the provider was located office. ?Session from 1015-1100 ? ?Subjective:  ? ?Patient ID:  AUSTEN WYGANT is a 41 y.o. (DOB 29-Jul-1980) female. ? ?Chief Complaint:  ?Chief Complaint  ?Patient presents with  ? Follow-up  ? Schizoaffective disorder, bipolar type   ? Depression  ? Sleeping Problem  ? Anxiety  ? ? ?  ?Sully Manzi Roettger presents  today for follow-up of schizoaffective depression with hallucinations and anxiety. ? ?When seen August 2020.  We reduced the Cytomel to 37.5 mcg bc of suppressed TSH.  She's noticed no problems. ? ?At visit September 19, 2018.  At that visit we reduced Wellbutrin XL 300 to 150 because of anxiety and recently high blood pressure.   ? ?At her visit in April 2020.  She reported no problems from reducing the Wellbutrin and that her anxiety was somewhat better.  There were no changes made at the visit in April. ? ?At visit October 2020.  No meds were changed.  Have  g[facility] not [facility] which is a significant risk for this patient. ?She will try to access it.  Overall Loxapine has been very good to me but some movement disorder issues with toes moving, teeth grinding, Arm stiffness R, swallow involuntary movements. ? ?seen May 25, 2019.  She remained on loxapine 300 mg.  Under stress she was still having hallucinations and she was given olanzapine 10 mg sublingual as needed auditory hallucinations.  She was continued on Wellbutrin XL 150 mg.  She was continued on Cytomel 37.5 mg every morning.  Also continued lithium 1200 mg daily and lamotrigine 200 mg daily. ?There was concern about the loxapine shortage and that she might have to be switched to another medication hopefully with low EPS risk such as Fanapt.  But she has a history of orthostatic hypotension and it is unclear if she could tolerate that medication. ? ?seen July 28, 2019.  Because of the shortage of loxapine we had to wean and discontinue that medication.  She was given Caplyta as an alternative antipsychotic given multiple other failures and her tendency to be very sensitive to EPS and tardive dyskinesia. ?Patient took it for about 3 weeks and then called February 1 stating that she was having nausea and involuntary facial movements and had found loxapine and wanted to return to it.  Therefore Caplyta was stopped and she was restarted on loxapine to quickly titrate back up to 300 mg nightly the same dose  that she is taken in the past. ? ?seen FEB 8 seen with her mother.  Ran out of Caplyta for 3 days bc pharmacy didn't have it.  Just back to 300 mg loxapine as of last night.  Slept fine on Caplyta with less duration needed. ?Mood pretty stable and not markedly depressed.  Today feels a ltttle psychotic with voices and feeling people are inside her a little confusion.  Movements from mouth have seemed better.  Has felt a little tight throat that scared her.  Mo notes facial  grimacing and lip pursing has almost totally gone.   ?Mood really well overall with ECT reduced to once monthly. Reports getting "modified bilateral"  ECT.   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  ?Tutoring stopped temporarily DT too confused and afraid of TD scaring the kids.. Twin sister had baby boy .  [name] is doing well.  Applying for better job.  Some are in other states.  Not worrying about it.  Together for 13 years.   ? ?As of October 02, 2019, Really well overall.   Not manic and "not too psychotic".  Voices down to mostly noise.   ?Patient reports stable mood and denies depressed or irritable moods.  Anxiety is better and using clonazepam or Seroquel.    Walking more has helped and less depression improved motivation. Patient denies difficulty with sleep initiation or maintenance. Denies appetite disturbance.  Patient reports that energy and motivation have been good.  Patient denies any difficulty with concentration.  Patient denies any suicidal ideation.  No recent panic.   Quiet noise. ?No med changes. ? ?11/29/19 appt the following noted: ?Needs trazodone and Seroquel to sleep.  Rare olanzapine for AH.  Still trouble falling asleep with meds and can lay in bed for hours.  Will sleep late to make up for it.   ?Otherwise well without mania nor depression.  Occ psychotic sx with stressors.  Nephro will leave her on lithium and start amiloride for Nephrogenic Diabetes Insipidus.  Has started and will fu with labs in 2 weeks.Sx polydipsia and polyuria and thirst.  Couple of accidents and 1 in public.  Said renal function was good otherwise.  No SI in a long time.   ?Tutoring 3 hours weekly.  Enjoys it.   ?[name] is not planning a job change at this time.  Will sign lease for apt for another year. ?Plan: no med changes ? ?05/07/2020 appointment with the following noted: ?Continued all meds.  Still doing maintenance ECT monthly. ?No olanzapine lately.  Only twice since had it.   ?For the most part  don't hear voices unless under severe stress. ?Mood happy without depression. ?Doing deep breathing from DBT to help with anxiety.  I'm still an anxious person. 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   ?CO weight gain since here and thinks it's from using the Seroquel 50-100 mg  for sleep and now trying to use less. ?Sleep well with Seroquel and Klonopin.  ?SE EPS bc walking with arms bent and this is leading to some pain R >L.  Can straighten it out. ? ?08/06/2020 appt with following noted: ?No olanzapine used in months. ?Well overall.  Mood pretty balanced now. ?No mania or depression now. ?Last had depression after severe UTI. ?Not sure when but anxiety worse in the am and hard to get off the couch until early afternoon.  No specific thoughts or fears associated.   Still going  to [employer] to meet friends. ?Not much caffeine 1 cup am.   ?Sleep really well.   ?Started Noom and lost 7 # so far.   ?Voices only problem when stressed.   ?Plan: Hold  Wellbutrin 150 for 1 week to see if anxiety is better .   ? ?10/02/2020 phone call from patient complaining of worsening depression with suicidal thoughts without intent. ?Nurse note:Rtc to pt and she reports having increased depression for about 1 week now, she had ECT on Friday 3/11 and will have ECT again this Friday the 18th. She has been waking up earlier then usual and unable to fall asleep, about an 1-1/2 hours early. ? Asking if there is anything she can do to help? Suicidal thoughts but nothing specific or any intent ?MD response:Lithium level checked on 09/27/2020 was 1.0.  Therefore no room to increase that dosage. ?On sertraline 200 mg daily so no room to increase that dosage. ?At the last appointment, I asked her to hold the Wellbutrin for a week to see if her anxiety was better.  I do not know if she stopped it or restarted it.  If she stopped it have her restart the Wellbutrin XL 150 mg 1 AM.  If she is still taking it have her increase the  Wellbutrin XL to 1-1/2 tablets every morning.  The bottle will say not to cut the tablets but because her dosage is low it is okay to cut the tablets if needed. ?Nurse contact with patient:Rtc to pt and she rep

## 2021-10-16 ENCOUNTER — Telehealth: Payer: Self-pay | Admitting: Psychiatry

## 2021-10-16 MED ORDER — SERTRALINE HCL 100 MG PO TABS: 150.0000 mg | ORAL_TABLET | Freq: Every day | ORAL | 0 refills | Status: DC

## 2021-10-16 MED ORDER — MIRTAZAPINE 15 MG PO TABS: 15.0000 mg | ORAL_TABLET | Freq: Every day | ORAL | 0 refills | Status: DC

## 2021-10-16 MED ORDER — LAMOTRIGINE 200 MG PO TABS: 200.0000 mg | ORAL_TABLET | Freq: Every day | ORAL | 0 refills | Status: DC

## 2021-10-16 MED ORDER — LIOTHYRONINE SODIUM 25 MCG PO TABS: ORAL_TABLET | ORAL | 0 refills | Status: DC

## 2021-10-16 MED ORDER — LITHIUM CARBONATE ER 300 MG PO TBCR: EXTENDED_RELEASE_TABLET | ORAL | 0 refills | Status: DC

## 2021-10-16 NOTE — Telephone Encounter (Signed)
Pt called at 4:38 pm and said that because all of her scripts were sent to mail order she will not receive them until April 10th. So she needs at least 10 [facility] [address] [facility] [place] until she receives her mail order ?

## 2021-10-16 NOTE — Telephone Encounter (Signed)
Scripts sent

## 2021-10-17 ENCOUNTER — Telehealth: Payer: Self-pay | Admitting: Adult Health

## 2021-10-17 ENCOUNTER — Other Ambulatory Visit: Payer: Self-pay

## 2021-10-17 DIAGNOSIS — F411 Generalized anxiety disorder: Secondary | ICD-10-CM

## 2021-10-17 DIAGNOSIS — F250 Schizoaffective disorder, bipolar type: Secondary | ICD-10-CM

## 2021-10-17 DIAGNOSIS — F5105 Insomnia due to other mental disorder: Secondary | ICD-10-CM

## 2021-10-17 DIAGNOSIS — F4001 Agoraphobia with panic disorder: Secondary | ICD-10-CM

## 2021-10-17 MED ORDER — QUETIAPINE FUMARATE 50 MG PO TABS: ORAL_TABLET | ORAL | 0 refills | Status: DC

## 2021-10-17 MED ORDER — LIOTHYRONINE SODIUM 25 MCG PO TABS: ORAL_TABLET | ORAL | 0 refills | Status: DC

## 2021-10-17 MED ORDER — LITHIUM CARBONATE ER 300 MG PO TBCR: EXTENDED_RELEASE_TABLET | ORAL | 0 refills | Status: DC

## 2021-10-17 MED ORDER — LAMOTRIGINE 200 MG PO TABS: 200.0000 mg | ORAL_TABLET | Freq: Every day | ORAL | 0 refills | Status: DC

## 2021-10-17 MED ORDER — SERTRALINE HCL 100 MG PO TABS: 150.0000 mg | ORAL_TABLET | Freq: Every day | ORAL | 0 refills | Status: DC

## 2021-10-17 MED ORDER — MIRTAZAPINE 15 MG PO TABS: 15.0000 mg | ORAL_TABLET | Freq: Every day | ORAL | 0 refills | Status: DC

## 2021-10-17 NOTE — Telephone Encounter (Signed)
Scripts sent

## 2021-10-17 NOTE — Telephone Encounter (Signed)
Rtc to patient and discussed message but she reports she didn't stop the Vraylar just the Pramipexole. She knew it was discussed but not that it was a definite. She reports not having any N/V now. Discussed with Dr. Jennelle Human again and he does still want her to discontinue Vraylar. Explained again that it will take awhile to get out of her system. She was concerned about psychotic symptoms. She does have apt back on Thursday and she can discuss any concerns then. She agreed to stop it.  ?

## 2021-10-17 NOTE — Telephone Encounter (Signed)
Patient called in stating that she woke up with pain and stiffness in left side of her neck as well as moving in left hand and mouth she thinks to due to the Vraylar. Or possibly from coming off different medication. Please rtc [phone] ?

## 2021-10-17 NOTE — Telephone Encounter (Signed)
I just saw her 3 days ago.  We stopped the Vraylar and pramipexole due to nausea and vomiting.  I did not start a new antipsychotic because I want to wait until her side effects resolved. ?Is too early to tell anything about what to do with her medicines.  I cannot start or stop anything right now. ?

## 2021-10-17 NOTE — Telephone Encounter (Signed)
I can contact her for more information. Wanted you to see it. ?

## 2021-10-20 ENCOUNTER — Telehealth: Payer: Self-pay | Admitting: Psychiatry

## 2021-10-20 NOTE — Telephone Encounter (Signed)
If she is having any respiratory distress then for sure either contact her primary care doctor or go to the emergency room.  It is quite possible that the movement problems are related to stopping the Vraylar.  She had a similar problem when she stopped Abilify that lasted for a long while.  I do not want to make any long-term medication decisions until her appointment in 3 days. ? ?Let her know I have spoken to someone in the psychiatry Department at [facility] about getting her another opinion on her medications this consult.  They have not given me an answer yet and are leaving a message with Dr. Lurena Joiner about him for her to call me back.  Laura Mathews is likely to have an appointment with him soon and I would suggest that she discuss her desire to get a second psychiatric opinion about her psychiatric medicines and to see if they can help assist in this process. ?

## 2021-10-20 NOTE — Telephone Encounter (Signed)
Rtc to pt and she said she is getting oxygen and not worried she can't breath. She also remembered having similar symptoms when stopping Abilify. Did give her the information about [facility] and she was very appreciative of that information and getting an apt. Advised her to go to ER if she is having trouble breathing/getting oxygen and she did agree. Instructed of no changes right now but to call if her symptoms worsen.  ?

## 2021-10-20 NOTE — Telephone Encounter (Signed)
We talked to her on Friday, anything I can tell her?  ?

## 2021-10-20 NOTE — Telephone Encounter (Signed)
Next visit is 10/23/21. Laura Mathews called and said that she has been having movement in her toes and arms. She sent a message about this recently. She states now she is having movements in her throat and lungs and is hard to breath some. She is currently at work. Her phone number is [phone]. ?

## 2021-10-20 NOTE — Telephone Encounter (Signed)
Please call her

## 2021-10-23 ENCOUNTER — Encounter: Payer: Self-pay | Admitting: Psychiatry

## 2021-10-23 ENCOUNTER — Telehealth (INDEPENDENT_AMBULATORY_CARE_PROVIDER_SITE_OTHER): Payer: Medicare Other | Admitting: Psychiatry

## 2021-10-23 DIAGNOSIS — F250 Schizoaffective disorder, bipolar type: Secondary | ICD-10-CM

## 2021-10-23 DIAGNOSIS — T50905A Adverse effect of unspecified drugs, medicaments and biological substances, initial encounter: Secondary | ICD-10-CM

## 2021-10-23 DIAGNOSIS — F4001 Agoraphobia with panic disorder: Secondary | ICD-10-CM | POA: Diagnosis not present

## 2021-10-23 DIAGNOSIS — G2589 Other specified extrapyramidal and movement disorders: Secondary | ICD-10-CM | POA: Diagnosis not present

## 2021-10-23 DIAGNOSIS — F411 Generalized anxiety disorder: Secondary | ICD-10-CM

## 2021-10-23 DIAGNOSIS — F5105 Insomnia due to other mental disorder: Secondary | ICD-10-CM

## 2021-10-23 NOTE — Telephone Encounter (Signed)
FYI, mother involved with televisit on 10/23/21.  OK to talk to her if needed. ?

## 2021-10-23 NOTE — Progress Notes (Signed)
Laura Mathews ?[id number] ?04/30/1981 ?41 y.o.  ? ?Video Visit via My Chart ? ?I connected with pt by My Chart and verified that I am speaking with the correct person using two identifiers. ?  ?I discussed the limitations, risks, security and privacy concerns of performing an evaluation and management service by My Chart  and the availability of in person appointments. I also discussed with the patient that there may be a patient responsible charge related to this service. The patient expressed understanding and agreed to proceed. ? ?I discussed the assessment and treatment plan with the patient. The patient was provided an opportunity to ask questions and all were answered. The patient agreed with the plan and demonstrated an understanding of the instructions. ?  ?The patient was advised to call back or seek an in-person evaluation if the symptoms worsen or if the condition fails to improve as anticipated. ? ?I provided 45 minutes of video time during this encounter.  The patient was located at home and the provider was located office. ?Session from 2 PM until 2:45 PM ? ?Subjective:  ? ?Patient ID:  Laura Mathews is a 41 y.o. (DOB 07-05-1981) female. ? ?Chief Complaint:  ?Chief Complaint  ?Patient presents with  ? Follow-up  ? Schizoaffective disorder, bipolar type  ? Medication Problem  ? Depression  ? ? ?  ?[name] presents  today for follow-up of schizoaffective depression with hallucinations and anxiety. ? ?When seen August 2020.  We reduced the Cytomel to 37.5 mcg bc of suppressed TSH.  She's noticed no problems. ? ?At visit September 19, 2018.  At that visit we reduced Wellbutrin XL 300 to 150 because of anxiety and recently high blood pressure.   ? ?At her visit in April 2020.  She reported no problems from reducing the Wellbutrin and that her anxiety was somewhat better.  There were no changes made at the visit in April. ? ?At visit October 2020.  No meds were changed.  Have  gotten notice about [facility] not having access to loxapine which is a significant risk for this patient. ?She will try to access it.  Overall Loxapine has been very good to me but some movement disorder issues with toes moving, teeth grinding, Arm stiffness R, swallow involuntary movements. ? ?seen May 25, 2019.  She remained on loxapine 300 mg.  Under stress she was still having hallucinations and she was given olanzapine 10 mg sublingual as needed auditory hallucinations.  She was continued on Wellbutrin XL 150 mg.  She was continued on Cytomel 37.5 mg every morning.  Also continued lithium 1200 mg daily and lamotrigine 200 mg daily. ?There was concern about the loxapine shortage and that she might have to be switched to another medication hopefully with low EPS risk such as Fanapt.  But she has a history of orthostatic hypotension and it is unclear if she could tolerate that medication. ? ?seen July 28, 2019.  Because of the shortage of loxapine we had to wean and discontinue that medication.  She was given Caplyta as an alternative antipsychotic given multiple other failures and her tendency to be very sensitive to EPS and tardive dyskinesia. ?Patient took it for about 3 weeks and then called February 1 stating that she was having nausea and involuntary facial movements and had found loxapine and wanted to return to it.  Therefore Caplyta was stopped and she was restarted on loxapine to quickly titrate back up to 300 mg nightly the same dose  that she is taken in the past. ? ?seen FEB 8 seen with her mother.  Ran out of Caplyta for 3 days bc pharmacy didn't have it.  Just back to 300 mg loxapine as of last night.  Slept fine on Caplyta with less duration needed. ?Mood pretty stable and not markedly depressed.  Today feels a ltttle psychotic with voices and feeling people are inside her a little confusion.  Movements from mouth have seemed better.  Has felt a little tight throat that scared her.  Mo notes facial  grimacing and lip pursing has almost totally gone.   ?Mood really well overall with ECT reduced to once monthly. Reports getting "modified bilateral"  ECT.   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  ?Tutoring stopped temporarily DT too confused and afraid of TD scaring the kids.. Twin sister had baby boy .  Laura Mathews is doing well.  Applying for better job.  Some are in other states.  Not worrying about it.  Together for 13 years.   ? ?As of October 02, 2019, Really well overall.   Not manic and "not too psychotic".  Voices down to mostly noise.   ?Patient reports stable mood and denies depressed or irritable moods.  Anxiety is better and using clonazepam or Seroquel.    Walking more has helped and less depression improved motivation. Patient denies difficulty with sleep initiation or maintenance. Denies appetite disturbance.  Patient reports that energy and motivation have been good.  Patient denies any difficulty with concentration.  Patient denies any suicidal ideation.  No recent panic.   Quiet noise. ?No med changes. ? ?11/29/19 appt the following noted: ?Needs trazodone and Seroquel to sleep.  Rare olanzapine for AH.  Still trouble falling asleep with meds and can lay in bed for hours.  Will sleep late to make up for it.   ?Otherwise well without mania nor depression.  Occ psychotic sx with stressors.  Nephro will leave her on lithium and start amiloride for Nephrogenic Diabetes Insipidus.  Has started and will fu with labs in 2 weeks.Sx polydipsia and polyuria and thirst.  Couple of accidents and 1 in public.  Said renal function was good otherwise.  No SI in a long time.   ?Tutoring 3 hours weekly.  Enjoys it.   ?Laura Mathews is not planning a job change at this time.  Will sign lease for apt for another year. ?Plan: no med changes ? ?05/07/2020 appointment with the following noted: ?Continued all meds.  Still doing maintenance ECT monthly. ?No olanzapine lately.  Only twice since had it.   ?For the most part  don't hear voices unless under severe stress. ?Mood happy without depression. ?Doing deep breathing from DBT to help with anxiety.  I'm still an anxious person. 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   ?CO weight gain since here and thinks it's from using the Seroquel 50-100 mg  for sleep and now trying to use less. ?Sleep well with Seroquel and Klonopin.  ?SE EPS bc walking with arms bent and this is leading to some pain R >L.  Can straighten it out. ? ?08/06/2020 appt with following noted: ?No olanzapine used in months. ?Well overall.  Mood pretty balanced now. ?No mania or depression now. ?Last had depression after severe UTI. ?Not sure when but anxiety worse in the am and hard to get off the couch until early afternoon.  No specific thoughts or fears associated.   Still going  to [employer] to meet friends. ?Not much caffeine 1 cup am.   ?Sleep really well.   ?Started Noom and lost 7 # so far.   ?Voices only problem when stressed.   ?Plan: Hold  Wellbutrin 150 for 1 week to see if anxiety is better .   ? ?10/02/2020 phone call from patient complaining of worsening depression with suicidal thoughts without intent. ?Nurse note:Rtc to pt and she reports having increased depression for about 1 week now, she had ECT on Friday 3/11 and will have ECT again this Friday the 18th. She has been waking up earlier then usual and unable to fall asleep, about an 1-1/2 hours early. ? Asking if there is anything she can do to help? Suicidal thoughts but nothing specific or any intent ?MD response:Lithium level checked on 09/27/2020 was 1.0.  Therefore no room to increase that dosage. ?On sertraline 200 mg daily so no room to increase that dosage. ?At the last appointment, I asked her to hold the Wellbutrin for a week to see if her anxiety was better.  I do not know if she stopped it or restarted it.  If she stopped it have her restart the Wellbutrin XL 150 mg 1 AM.  If she is still taking it have her increase the  Wellbutrin XL to 1-1/2 tablets every morning.  The bottle will say not to cut the tablets but because her dosage is low it is okay to cut the tablets if needed. ?Nurse contact with patient:Rtc to pt and she repo

## 2021-10-27 ENCOUNTER — Telehealth: Payer: Self-pay | Admitting: Psychiatry

## 2021-10-27 ENCOUNTER — Other Ambulatory Visit: Payer: Self-pay | Admitting: Psychiatry

## 2021-10-27 DIAGNOSIS — T50905A Adverse effect of unspecified drugs, medicaments and biological substances, initial encounter: Secondary | ICD-10-CM

## 2021-10-27 DIAGNOSIS — F250 Schizoaffective disorder, bipolar type: Secondary | ICD-10-CM

## 2021-10-27 NOTE — Telephone Encounter (Signed)
Rtc to pt and asked what was going on today, she reports just sleeping all the time because she so depressed. She reports feeling that way since her last visit 4/06. Reports SI but very vague; just really down and depressed. I also informed her she needed to delete messages on her voicemail because it was full. She said she would.  ? ?Informed her I would let Dr. Clovis Pu know and follow up with her.  ?

## 2021-10-27 NOTE — Telephone Encounter (Signed)
If she is having suicidal thoughts she needs to go to [facility] emergency room and tell them that she is suicidal.  If she gets admitted that has an added advantage that it will get her [facility] psychiatrist more quickly.  That is what we are trying to do and that is what her sister is trying to do.  I do not want to make another medicine change until we get her an appointment with the [facility] psychiatrist.  We discussed this plan at her last appointment. ?

## 2021-10-27 NOTE — Telephone Encounter (Signed)
Rtc to pt and she reports she does not want to go to the ER today, doesn't feel like she needs to. [name], her partner is with her now. She has not spoken with her sister. Informed her she needs to be safe and if at any point she is not feeling safe to herself she needs to go to the ER. She verbalizes understanding.  ?

## 2021-10-27 NOTE — Telephone Encounter (Signed)
I called Laura Mathews this morning at 10:30am to schedule her follow up appts with Dr. Clovis Pu.  She indicated that she was suicidal.  I asked her if anyone was with her and she said no.  I asked if I needed to get someone to get her to the hospital and she said no.  She did say something about her sister trying to get an appt for her but was having difficultly doing that.  ?

## 2021-10-28 NOTE — Telephone Encounter (Signed)
?   Side effects 

## 2021-10-31 ENCOUNTER — Telehealth: Payer: Self-pay | Admitting: Psychiatry

## 2021-10-31 ENCOUNTER — Other Ambulatory Visit: Payer: Self-pay

## 2021-10-31 DIAGNOSIS — F250 Schizoaffective disorder, bipolar type: Secondary | ICD-10-CM

## 2021-10-31 DIAGNOSIS — F411 Generalized anxiety disorder: Secondary | ICD-10-CM

## 2021-10-31 DIAGNOSIS — F4001 Agoraphobia with panic disorder: Secondary | ICD-10-CM

## 2021-10-31 DIAGNOSIS — F5105 Insomnia due to other mental disorder: Secondary | ICD-10-CM

## 2021-10-31 MED ORDER — SERTRALINE HCL 100 MG PO TABS: 150.0000 mg | ORAL_TABLET | Freq: Every day | ORAL | 0 refills | Status: AC

## 2021-10-31 MED ORDER — MIRTAZAPINE 15 MG PO TABS
15.0000 mg | ORAL_TABLET | Freq: Every day | ORAL | 0 refills | Status: AC
Start: 2021-10-31 — End: ?

## 2021-10-31 MED ORDER — LITHIUM CARBONATE ER 300 MG PO TBCR: EXTENDED_RELEASE_TABLET | ORAL | 0 refills | Status: AC

## 2021-10-31 MED ORDER — LAMOTRIGINE 200 MG PO TABS: 200.0000 mg | ORAL_TABLET | Freq: Every day | ORAL | 0 refills | Status: AC

## 2021-10-31 MED ORDER — LIOTHYRONINE SODIUM 25 MCG PO TABS: ORAL_TABLET | ORAL | 0 refills | Status: AC

## 2021-10-31 MED ORDER — QUETIAPINE FUMARATE 50 MG PO TABS: ORAL_TABLET | ORAL | 0 refills | Status: AC

## 2021-10-31 MED ORDER — TRAZODONE HCL 100 MG PO TABS: 100.0000 mg | ORAL_TABLET | Freq: Every day | ORAL | 0 refills | Status: AC

## 2021-10-31 NOTE — Telephone Encounter (Signed)
Patient is at the pharmacy with a friend. Pharmacist said she is c/o AH and SI. She is asking for refills on her medications but doesn't know what she is taking. Patient had requested refills to [facility], which were sent. She also requested a 2-week bridge, which was done 3/31. I reviewed medications with pharmacist, as she had picked up the bridging refills and does not have any RF available there. A family member is going to bring all her medication bottles to the pharmacy to see what medications patient actually needs. I contacted [facility] and RF were sent 4/12, expected delivery on 4/17. Waiting to hear back from pharmacist what medications we may need to refill. I have not talked to the patient or any family members.  ?

## 2021-10-31 NOTE — Telephone Encounter (Signed)
Pharmacist, Elissa LVM @ 11:11a.  She said pt was there and she would like a call back asap. Pt isn't having a good day.  Pharmacist wants to clarify the meds that she is on, none of which have any refills. ? ?Next appt 5/25 ?

## 2021-11-03 ENCOUNTER — Telehealth: Payer: Self-pay | Admitting: Psychiatry

## 2021-11-03 NOTE — Telephone Encounter (Signed)
[name], pt's mother, called and LM today at 9:11am .  Laura Mathews is transitioning care to a [facility] provider but doesn't have an appt until May 10th. Mother says that she is psychotic and needs to get put on an antipsychotic asap. Please call. We do have ROI on file. ?

## 2021-11-03 NOTE — Telephone Encounter (Signed)
Recommendations provided to the mother. She understood and agreed with the plan.  ?

## 2021-11-03 NOTE — Telephone Encounter (Signed)
Mom feels like patient needs to be put back on a small dose of Vraylar. Mom said patient's depression has improved, but she is hearing multiple voices off and on throughout the day telling her to kill herself, but patient does not want to. Mom states patient is having difficulties with speech and hand movements that are like she is playing castanets.  ?

## 2021-11-03 NOTE — Telephone Encounter (Signed)
We reduced the Vraylar from 3 mg daily which is the usual minimum dose for auditory hallucinations to 1.5 mg daily on March 9 because the patient was having nausea and vomiting episodes and she felt Vraylar was the cause.  These intermittent episodes of nausea and vomiting continued even on the lower dose and so we stopped at the Vraylar at her recent appointment.  It was not clear to me that Leafy Kindle was the cause but it is possible.   ?She can go back on the Vraylar until her appointment with the new psychiatrist on May 10.  She is medication sensitive and can try the 1.5 mg capsule or equivalently the 3 mg capsule every other day, which ever she has available.  If 1.5 mg does not reduce the auditory hallucinations she may have to bump the dose back to 3 mg daily. ?I mentioned at her last appointment that the choice of antipsychotic is really the primary question for the new psychiatrist and I did not want to try something new before that appointment.  So therefore I agree with Jhana's mother that restarting Leafy Kindle would be preferable over trying something completely new and unknown ?The finger movements may also get better back on the Vraylar. ?

## 2021-11-05 ENCOUNTER — Telehealth: Payer: Self-pay | Admitting: Psychiatry

## 2021-11-05 NOTE — Telephone Encounter (Signed)
Pt LVM @ 3:28p.  She said she is manic and didn't sleep last night.  She said she has "? Energy".  (I couldn't understand the work she used to describe the energy). ?She wants to know what Dr. Clovis Pu wants her to do. ? ?Next appt 5/25 ?

## 2021-11-06 NOTE — Telephone Encounter (Signed)
Please see message. °

## 2021-11-18 ENCOUNTER — Telehealth: Payer: Self-pay | Admitting: Psychiatry

## 2021-11-18 NOTE — Telephone Encounter (Signed)
Pt left voice mail stating she has had real bad headache for past 4 days. Nothing seems to help. Maybe one of the meds? Also, reporting her appt with new clinic is 5/10. Contact # [phone]  ?

## 2021-11-18 NOTE — Telephone Encounter (Signed)
We have not made any recent medicine changes that would cause headaches.  At one of her most recent appointments in which her sister who is a doctor, was involved; her sister indicated that she was missing medications.  She was missing the medicines due to forgetfulness.  If she is missing medicines and inconsistent with the dosing that will cause headaches.  If she is consistent with that medicine she should contact her primary care doctor about the headaches because it does not have anything to do with psychiatric matters ?

## 2021-11-18 NOTE — Telephone Encounter (Signed)
Rtc to pt and she reports having a bad headache for last 4 days in the back of her head where her neck meets. She reports she has had this before with other antipsychotics. Nothing is helping, she has tried tylenol, ibuprofen, and aleve. Informed her I would check with Dr. Jennelle Human on any recommendation and call her back.  ?

## 2021-11-18 NOTE — Telephone Encounter (Signed)
Called patient and she said she had been inconsistent with her Vraylar. She said she was taking 1.5 and 3 mg together and said she shouldn't have been doing that should I, but then said she was taking 1.5 some days and 3 mg every other day. I asked if she had a pill box and she said she did. She apologized and said she had been having a difficult time lately. She is also asking for a RF on 1.5. I will wait to send until I get your feedback.  ?

## 2021-11-21 NOTE — Telephone Encounter (Signed)
Stay on on Vraylar 1.5 mg daily.  She is seeking a Therapist, sports in [place] near where she lives.  Does  she have an appointment with 1? ?

## 2021-11-21 NOTE — Telephone Encounter (Signed)
Patient does have an appt on 5/10.  ?

## 2021-12-11 ENCOUNTER — Ambulatory Visit: Payer: Medicare Other | Admitting: Psychiatry

## 2021-12-14 ENCOUNTER — Other Ambulatory Visit: Payer: Self-pay | Admitting: Psychiatry

## 2021-12-15 NOTE — Telephone Encounter (Signed)
?   Following with CR.

## 2022-01-04 ENCOUNTER — Other Ambulatory Visit: Payer: Self-pay | Admitting: Psychiatry

## 2022-01-04 DIAGNOSIS — F250 Schizoaffective disorder, bipolar type: Secondary | ICD-10-CM

## 2022-01-04 DIAGNOSIS — F411 Generalized anxiety disorder: Secondary | ICD-10-CM

## 2022-01-04 DIAGNOSIS — F5105 Insomnia due to other mental disorder: Secondary | ICD-10-CM

## 2022-01-04 DIAGNOSIS — F4001 Agoraphobia with panic disorder: Secondary | ICD-10-CM

## 2022-01-08 ENCOUNTER — Telehealth: Payer: Medicare Other | Admitting: Psychiatry

## 2022-01-27 ENCOUNTER — Other Ambulatory Visit: Payer: Self-pay | Admitting: Psychiatry

## 2022-01-27 DIAGNOSIS — F250 Schizoaffective disorder, bipolar type: Secondary | ICD-10-CM

## 2022-02-05 ENCOUNTER — Telehealth: Payer: Medicare Other | Admitting: Psychiatry

## 2023-06-02 ENCOUNTER — Encounter: Payer: Self-pay | Admitting: Psychiatry

## 2023-12-15 ENCOUNTER — Emergency Department
Admission: EM | Admit: 2023-12-15 | Discharge: 2023-12-15 | Disposition: A | Discharge status: Home / Self Care | Attending: Emergency Medicine | Admitting: Emergency Medicine

## 2023-12-15 ENCOUNTER — Other Ambulatory Visit: Payer: Self-pay

## 2023-12-15 DIAGNOSIS — R55 Syncope and collapse: Secondary | ICD-10-CM | POA: Insufficient documentation

## 2023-12-15 DIAGNOSIS — R259 Unspecified abnormal involuntary movements: Secondary | ICD-10-CM

## 2023-12-15 DIAGNOSIS — R258 Other abnormal involuntary movements: Secondary | ICD-10-CM | POA: Diagnosis present

## 2023-12-15 LAB — COMPREHENSIVE METABOLIC PANEL WITH GFR
ALT: 19 U/L (ref 0–44)
AST: 18 U/L (ref 15–41)
Albumin: 4.2 g/dL (ref 3.5–5.0)
Alkaline Phosphatase: 40 U/L (ref 38–126)
Anion gap: 7 (ref 5–15)
BUN: 16 mg/dL (ref 6–20)
CO2: 22 mmol/L (ref 22–32)
Calcium: 10.2 mg/dL (ref 8.9–10.3)
Chloride: 108 mmol/L (ref 98–111)
Creatinine, Ser: 0.95 mg/dL (ref 0.44–1.00)
GFR, Estimated: >60 mL/min (ref >60)
Glucose, Bld: 105 mg/dL — ABNORMAL HIGH (ref 70–99)
Potassium: 4 mmol/L (ref 3.5–5.1)
Sodium: 137 mmol/L (ref 135–145)
Total Bilirubin: 0.4 mg/dL (ref 0.0–1.2)
Total Protein: 7.3 g/dL (ref 6.5–8.1)

## 2023-12-15 LAB — CBC WITH DIFFERENTIAL/PLATELET
Abs Immature Granulocytes: 0.07 10*3/uL (ref 0.00–0.07)
Basophils Absolute: 0.1 10*3/uL (ref 0.0–0.1)
Basophils Relative: 1 %
Eosinophils Absolute: 0.4 10*3/uL (ref 0.0–0.5)
Eosinophils Relative: 3 %
HCT: 39.7 % (ref 36.0–46.0)
Hemoglobin: 13.1 g/dL (ref 12.0–15.0)
Immature Granulocytes: 1 %
Lymphocytes Relative: 20 %
Lymphs Abs: 2 10*3/uL (ref 0.7–4.0)
MCH: 30 pg (ref 26.0–34.0)
MCHC: 33 g/dL (ref 30.0–36.0)
MCV: 90.8 fL (ref 80.0–100.0)
Monocytes Absolute: 0.9 10*3/uL (ref 0.1–1.0)
Monocytes Relative: 9 %
Neutro Abs: 6.8 10*3/uL (ref 1.7–7.7)
Neutrophils Relative %: 66 %
Platelets: 214 10*3/uL (ref 150–400)
RBC: 4.37 MIL/uL (ref 3.87–5.11)
RDW: 13.3 % (ref 11.5–15.5)
WBC: 10.2 10*3/uL (ref 4.0–10.5)
nRBC: 0 % (ref 0.0–0.2)

## 2023-12-15 NOTE — Discharge Instructions (Addendum)
 Your testing today was fortunately overall reassuring.  Follow-up with your outpatient doctors as directed.  Return to the ER for any new or worsening symptoms.

## 2023-12-15 NOTE — ED Provider Notes (Signed)
 [facility] Provider Note    Event Date/Time   First MD Initiated Contact with Patient 12/15/23 1136     (approximate)   History   No chief complaint on file.   HPI  Laura Mathews is a 43 year old female with history of schizoaffective disorder, functional neurologic disorder presenting to the ER for evaluation of seizure-like activity.  Patient was at [facility] with friends when she reports that she felt "off".  Per EMS, patient had about 15 seconds of seizure-like activity.  Patient tells me that she does not think she ever lost consciousness.  She does report significant baseline movement disorder related to her tardive dyskinesia often affecting her extremities more so on the left.  She reports she has had multiple similar episodes in the past most recently a few weeks ago.  Has been trying to sleep less during the day at the encouragement of her outpatient doctors which she thinks may have triggered this episode.  Currently feels back at her baseline.  I reviewed her ER visit from 5/14 at [facility].  There, patient presented following multiple syncopal episodes with reassuring exam and diagnostics.  I additionally reviewed her discharge summary from 08/06/2022.  At that time she was admitted to [facility] with concerns for possible new seizure disorder.  Her testing at that time was unremarkable and she was discharged with suspected functional neurologic disorder.     Physical Exam   Triage Vital Signs: ED Triage Vitals  Encounter Vitals Group     BP 12/15/23 1138 (!) 144/108     Systolic BP Percentile --      Diastolic BP Percentile --      Pulse Rate 12/15/23 1138 84     Resp 12/15/23 1138 16     Temp 12/15/23 1138 98.6 F (37 C)     Temp src --      SpO2 12/15/23 1138 99 %     Weight 12/15/23 1136 157 lb (71.2 kg)     Height 12/15/23 1136 5\' 4"  (1.626 m)     Head Circumference --      Peak Flow --      Pain Score 12/15/23 1135 6      Pain Loc --      Pain Education --      Exclude from Growth Chart --     Most recent vital signs: Vitals:   12/15/23 1200 12/15/23 1230  BP: (!) 116/91 (!) 114/97  Pulse: 76 75  Resp: 15 17  Temp:    SpO2: 98% 98%     General: Awake, interactive  CV:  Regular rate, good peripheral perfusion.  Resp:  Unlabored respirations.  Abd:  Nondistended.  Neuro:  Alert and oriented, normal extraocular movements, symmetric facial movement, sensation intact over bilateral upper and lower extremities with 5 out of 5 strength.  Normal finger-to-nose testing.   ED Results / Procedures / Treatments   Labs (all labs ordered are listed, but only abnormal results are displayed) Labs Reviewed  COMPREHENSIVE METABOLIC PANEL WITH GFR - Abnormal; Notable for the following components:      Result Value   Glucose, Bld 105 (*)    All other components within normal limits  CBC WITH DIFFERENTIAL/PLATELET     EKG EKG independently reviewed interpreted by myself (ER attending) demonstrates:  EKG demonstrates sinus rhythm at a rate of 85, PR 153, QRS 114, QTc 439, no acute ST changes  RADIOLOGY Imaging independently reviewed and interpreted  by myself demonstrates:   Formal Radiology Read:  No results found.  PROCEDURES:  Critical Care performed: No  Procedures   MEDICATIONS ORDERED IN ED: Medications - No data to display   IMPRESSION / MDM / ASSESSMENT AND PLAN / ED COURSE  I reviewed the triage vital signs and the nursing notes.  Differential diagnosis includes, but is not limited to, convulsive syncope, arrhythmia, anemia, electrolyte abnormality, movement related to known underlying neurologic disorders, consideration for seizure with no clinical history left suggestive of this  Patient's presentation is most consistent with acute presentation with potential threat to life or bodily function.  43 year old female presenting following an episode of abnormal movements.  Stable vitals  on presentation.  No known seizure history and lower suspicion of this based on patient's clinical description.  EKG reassuring.  Will obtain labs.  Do not think there is an indication for repeat neuroimaging.  1328 Labs reassuring.  Patient reassessed.  No further episodes of feeling abnormal, changes in her baseline movements.  She is comfortable discharge home.  Do think this is reasonable.  Strict return precautions provided.  Patient discharged stable condition.      FINAL CLINICAL IMPRESSION(S) / ED DIAGNOSES   Final diagnoses:  Near syncope  Abnormal movements     Rx / DC Orders   ED Discharge Orders     None        Note:  This document was prepared using Dragon voice recognition software and may include unintentional dictation errors.   Claria Crofts, MD 12/15/23 1329

## 2023-12-15 NOTE — ED Triage Notes (Signed)
 Pt presents to ED from [facility] for witnessed seizure that lasted about 15 seconds per EMS.
